# Patient Record
Sex: Male | Born: 1935
Health system: Southern US, Community
[De-identification: ages and names within clinical notes are randomized; demographics above are authoritative.]

## PROBLEM LIST (undated history)

## (undated) DIAGNOSIS — E781 Pure hyperglyceridemia: Secondary | ICD-10-CM

## (undated) DIAGNOSIS — F0390 Unspecified dementia without behavioral disturbance: Secondary | ICD-10-CM

## (undated) DIAGNOSIS — I499 Cardiac arrhythmia, unspecified: Secondary | ICD-10-CM

## (undated) DIAGNOSIS — N189 Chronic kidney disease, unspecified: Secondary | ICD-10-CM

## (undated) DIAGNOSIS — M549 Dorsalgia, unspecified: Secondary | ICD-10-CM

## (undated) DIAGNOSIS — K219 Gastro-esophageal reflux disease without esophagitis: Secondary | ICD-10-CM

## (undated) DIAGNOSIS — K279 Peptic ulcer, site unspecified, unspecified as acute or chronic, without hemorrhage or perforation: Secondary | ICD-10-CM

## (undated) DIAGNOSIS — I219 Acute myocardial infarction, unspecified: Secondary | ICD-10-CM

## (undated) DIAGNOSIS — I9789 Other postprocedural complications and disorders of the circulatory system, not elsewhere classified: Secondary | ICD-10-CM

## (undated) DIAGNOSIS — F0391 Unspecified dementia with behavioral disturbance: Secondary | ICD-10-CM

## (undated) DIAGNOSIS — R29898 Other symptoms and signs involving the musculoskeletal system: Secondary | ICD-10-CM

## (undated) DIAGNOSIS — J449 Chronic obstructive pulmonary disease, unspecified: Secondary | ICD-10-CM

## (undated) DIAGNOSIS — E785 Hyperlipidemia, unspecified: Secondary | ICD-10-CM

## (undated) DIAGNOSIS — F03918 Unspecified dementia, unspecified severity, with other behavioral disturbance: Secondary | ICD-10-CM

## (undated) DIAGNOSIS — I2510 Atherosclerotic heart disease of native coronary artery without angina pectoris: Secondary | ICD-10-CM

## (undated) DIAGNOSIS — M2550 Pain in unspecified joint: Secondary | ICD-10-CM

## (undated) DIAGNOSIS — I4891 Unspecified atrial fibrillation: Secondary | ICD-10-CM

## (undated) DIAGNOSIS — M1990 Unspecified osteoarthritis, unspecified site: Secondary | ICD-10-CM

## (undated) DIAGNOSIS — I10 Essential (primary) hypertension: Secondary | ICD-10-CM

## (undated) DIAGNOSIS — L570 Actinic keratosis: Secondary | ICD-10-CM

## (undated) DIAGNOSIS — K649 Unspecified hemorrhoids: Secondary | ICD-10-CM

## (undated) HISTORY — DX: Peptic ulcer, site unspecified, unspecified as acute or chronic, without hemorrhage or perforation: K27.9

## (undated) HISTORY — DX: Pure hyperglyceridemia: E78.1

## (undated) HISTORY — DX: Dorsalgia, unspecified: M54.9

## (undated) HISTORY — DX: Unspecified dementia, unspecified severity, with behavioral disturbance: F03.91

## (undated) HISTORY — PX: EYE SURGERY: SHX253

## (undated) HISTORY — DX: Unspecified dementia, unspecified severity, with other behavioral disturbance: F03.918

## (undated) HISTORY — DX: Hyperlipidemia, unspecified: E78.5

## (undated) HISTORY — DX: Unspecified atrial fibrillation: I48.91

## (undated) HISTORY — DX: Essential (primary) hypertension: I10

## (undated) HISTORY — DX: Atherosclerotic heart disease of native coronary artery without angina pectoris: I25.10

## (undated) HISTORY — DX: Other postprocedural complications and disorders of the circulatory system, not elsewhere classified: I97.89

## (undated) HISTORY — DX: Other symptoms and signs involving the musculoskeletal system: R29.898

## (undated) HISTORY — DX: Gastro-esophageal reflux disease without esophagitis: K21.9

---
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
## 1986-03-22 HISTORY — PX: CORONARY ARTERY BYPASS GRAFT: SHX141

## 1997-11-20 HISTORY — PX: CORONARY ANGIOPLASTY WITH STENT PLACEMENT: SHX49

## 1997-12-18 ENCOUNTER — Observation Stay (HOSPITAL_COMMUNITY): Admission: AD | Admit: 1997-12-18 | Discharge: 1997-12-19 | Payer: Self-pay | Admitting: Cardiology

## 1997-12-30 ENCOUNTER — Inpatient Hospital Stay (HOSPITAL_COMMUNITY): Admission: EM | Admit: 1997-12-30 | Discharge: 1998-01-01 | Payer: Self-pay | Admitting: Emergency Medicine

## 1998-02-21 ENCOUNTER — Ambulatory Visit (HOSPITAL_COMMUNITY): Admission: RE | Admit: 1998-02-21 | Discharge: 1998-02-21 | Payer: Self-pay | Admitting: *Deleted

## 1998-05-18 ENCOUNTER — Encounter: Payer: Self-pay | Admitting: Emergency Medicine

## 1998-05-19 ENCOUNTER — Inpatient Hospital Stay (HOSPITAL_COMMUNITY): Admission: EM | Admit: 1998-05-19 | Discharge: 1998-05-20 | Payer: Self-pay | Admitting: Emergency Medicine

## 2001-03-22 HISTORY — PX: HEMORRHOID BANDING: SHX5850

## 2001-08-31 ENCOUNTER — Emergency Department (HOSPITAL_COMMUNITY): Admission: EM | Admit: 2001-08-31 | Discharge: 2001-08-31 | Payer: Self-pay | Admitting: *Deleted

## 2001-09-19 ENCOUNTER — Encounter: Payer: Self-pay | Admitting: Urology

## 2001-09-19 ENCOUNTER — Encounter: Admission: RE | Admit: 2001-09-19 | Discharge: 2001-09-19 | Payer: Self-pay | Admitting: Urology

## 2002-08-22 ENCOUNTER — Emergency Department (HOSPITAL_COMMUNITY): Admission: EM | Admit: 2002-08-22 | Discharge: 2002-08-22 | Payer: Self-pay | Admitting: Emergency Medicine

## 2002-09-18 ENCOUNTER — Inpatient Hospital Stay (HOSPITAL_COMMUNITY): Admission: AD | Admit: 2002-09-18 | Discharge: 2002-09-19 | Payer: Self-pay | Admitting: Cardiology

## 2002-09-19 ENCOUNTER — Encounter: Payer: Self-pay | Admitting: Cardiology

## 2004-03-13 ENCOUNTER — Ambulatory Visit: Payer: Self-pay | Admitting: Gastroenterology

## 2004-03-30 ENCOUNTER — Ambulatory Visit: Payer: Self-pay | Admitting: Gastroenterology

## 2004-04-01 ENCOUNTER — Ambulatory Visit: Payer: Self-pay | Admitting: Gastroenterology

## 2004-04-17 ENCOUNTER — Ambulatory Visit: Payer: Self-pay | Admitting: Cardiology

## 2004-04-24 ENCOUNTER — Ambulatory Visit: Payer: Self-pay | Admitting: Cardiovascular Disease

## 2004-05-01 ENCOUNTER — Ambulatory Visit: Payer: Self-pay | Admitting: Gastroenterology

## 2004-07-17 ENCOUNTER — Ambulatory Visit: Payer: Self-pay | Admitting: Cardiology

## 2004-07-24 ENCOUNTER — Ambulatory Visit: Payer: Self-pay | Admitting: Cardiovascular Disease

## 2004-09-17 ENCOUNTER — Ambulatory Visit: Payer: Self-pay | Admitting: Cardiology

## 2004-09-25 ENCOUNTER — Ambulatory Visit: Payer: Self-pay | Admitting: Cardiology

## 2004-11-20 ENCOUNTER — Ambulatory Visit: Payer: Self-pay | Admitting: *Deleted

## 2004-12-04 ENCOUNTER — Ambulatory Visit: Payer: Self-pay | Admitting: Internal Medicine

## 2005-01-15 ENCOUNTER — Ambulatory Visit: Payer: Self-pay | Admitting: Cardiology

## 2005-01-22 ENCOUNTER — Ambulatory Visit: Payer: Self-pay | Admitting: Cardiology

## 2005-02-19 ENCOUNTER — Ambulatory Visit: Payer: Self-pay | Admitting: Cardiology

## 2005-03-05 ENCOUNTER — Ambulatory Visit: Payer: Self-pay | Admitting: Cardiology

## 2005-04-06 ENCOUNTER — Ambulatory Visit: Payer: Self-pay | Admitting: *Deleted

## 2005-04-30 ENCOUNTER — Ambulatory Visit: Payer: Self-pay | Admitting: Cardiology

## 2005-05-14 ENCOUNTER — Ambulatory Visit: Payer: Self-pay | Admitting: Cardiology

## 2005-06-20 HISTORY — PX: CARDIAC CATHETERIZATION: SHX172

## 2005-07-02 ENCOUNTER — Ambulatory Visit: Payer: Self-pay | Admitting: Cardiology

## 2005-07-09 ENCOUNTER — Ambulatory Visit: Payer: Self-pay | Admitting: Internal Medicine

## 2005-08-20 ENCOUNTER — Ambulatory Visit: Payer: Self-pay | Admitting: Cardiology

## 2005-08-27 ENCOUNTER — Ambulatory Visit: Payer: Self-pay | Admitting: Cardiology

## 2005-10-27 ENCOUNTER — Ambulatory Visit: Payer: Self-pay | Admitting: Cardiology

## 2005-11-04 ENCOUNTER — Ambulatory Visit: Payer: Self-pay | Admitting: Cardiology

## 2005-12-23 ENCOUNTER — Ambulatory Visit: Payer: Self-pay | Admitting: Cardiology

## 2005-12-23 ENCOUNTER — Inpatient Hospital Stay (HOSPITAL_COMMUNITY): Admission: EM | Admit: 2005-12-23 | Discharge: 2005-12-24 | Payer: Self-pay | Admitting: Emergency Medicine

## 2005-12-30 ENCOUNTER — Ambulatory Visit: Payer: Self-pay | Admitting: Cardiology

## 2006-01-26 ENCOUNTER — Ambulatory Visit: Payer: Self-pay | Admitting: Cardiology

## 2006-02-21 ENCOUNTER — Ambulatory Visit: Payer: Self-pay | Admitting: Cardiology

## 2006-02-21 LAB — CONVERTED CEMR LAB
ALT: 17 units/L (ref 0–40)
AST: 20 units/L (ref 0–37)
Albumin: 4 g/dL (ref 3.5–5.2)
Cholesterol: 193 mg/dL (ref 0–200)
HDL: 36.5 mg/dL — ABNORMAL LOW (ref >39.0)
Total Bilirubin: 1 mg/dL (ref 0.3–1.2)
Total CK: 56 units/L (ref 7–195)

## 2006-02-24 ENCOUNTER — Ambulatory Visit: Payer: Self-pay | Admitting: Cardiology

## 2006-04-15 ENCOUNTER — Ambulatory Visit: Payer: Self-pay | Admitting: Cardiology

## 2006-04-15 LAB — CONVERTED CEMR LAB
ALT: 19 units/L (ref 0–40)
AST: 21 units/L (ref 0–37)
Albumin: 4.1 g/dL (ref 3.5–5.2)
Alkaline Phosphatase: 43 units/L (ref 39–117)
Bilirubin, Direct: 0.2 mg/dL (ref 0.0–0.3)
Cholesterol: 153 mg/dL (ref 0–200)
Direct LDL: 81.4 mg/dL
HDL: 43.4 mg/dL (ref >39.0)
Total Bilirubin: 0.8 mg/dL (ref 0.3–1.2)
Total CHOL/HDL Ratio: 3.5
Total Protein: 6.6 g/dL (ref 6.0–8.3)
Triglycerides: 214 mg/dL (ref 0–149)
VLDL: 43 mg/dL — ABNORMAL HIGH (ref 0–40)

## 2006-04-21 ENCOUNTER — Ambulatory Visit: Payer: Self-pay | Admitting: Cardiology

## 2006-08-04 ENCOUNTER — Ambulatory Visit: Payer: Self-pay | Admitting: Internal Medicine

## 2006-08-04 LAB — CONVERTED CEMR LAB
Bilirubin, Direct: 0.2 mg/dL (ref 0.0–0.3)
Cholesterol: 181 mg/dL (ref 0–200)
HDL: 40.8 mg/dL (ref >39.0)
LDL Cholesterol: 103 mg/dL — ABNORMAL HIGH (ref 0–99)
Total Bilirubin: 0.7 mg/dL (ref 0.3–1.2)
Total CHOL/HDL Ratio: 4.4
Total Protein: 6.6 g/dL (ref 6.0–8.3)
Triglycerides: 184 mg/dL — ABNORMAL HIGH (ref 0–149)

## 2006-08-18 ENCOUNTER — Ambulatory Visit: Payer: Self-pay | Admitting: Cardiology

## 2006-10-07 ENCOUNTER — Ambulatory Visit: Payer: Self-pay | Admitting: Internal Medicine

## 2006-10-07 LAB — CONVERTED CEMR LAB
AST: 19 units/L (ref 0–37)
Cholesterol: 162 mg/dL (ref 0–200)
Direct LDL: 94.5 mg/dL
HDL: 37.7 mg/dL — ABNORMAL LOW (ref >39.0)
Total Bilirubin: 1 mg/dL (ref 0.3–1.2)
Total Protein: 6.5 g/dL (ref 6.0–8.3)
Triglycerides: 223 mg/dL (ref 0–149)

## 2006-10-13 ENCOUNTER — Ambulatory Visit: Payer: Self-pay | Admitting: Internal Medicine

## 2006-12-09 ENCOUNTER — Ambulatory Visit: Payer: Self-pay | Admitting: Cardiovascular Disease

## 2006-12-09 LAB — CONVERTED CEMR LAB
ALT: 16 units/L (ref 0–53)
AST: 20 units/L (ref 0–37)
Alkaline Phosphatase: 36 units/L — ABNORMAL LOW (ref 39–117)
Bilirubin, Direct: 0.2 mg/dL (ref 0.0–0.3)
Cholesterol: 167 mg/dL (ref 0–200)
Total CHOL/HDL Ratio: 5
Total Protein: 6.7 g/dL (ref 6.0–8.3)

## 2006-12-15 ENCOUNTER — Ambulatory Visit: Payer: Self-pay | Admitting: Cardiology

## 2007-02-07 ENCOUNTER — Ambulatory Visit: Payer: Self-pay | Admitting: Internal Medicine

## 2007-02-10 ENCOUNTER — Ambulatory Visit: Admission: RE | Admit: 2007-02-10 | Discharge: 2007-02-10 | Payer: Self-pay | Admitting: Internal Medicine

## 2007-02-10 ENCOUNTER — Ambulatory Visit: Payer: Self-pay

## 2007-02-10 ENCOUNTER — Ambulatory Visit (HOSPITAL_COMMUNITY): Admission: RE | Admit: 2007-02-10 | Discharge: 2007-02-10 | Payer: Self-pay | Admitting: Internal Medicine

## 2007-03-30 ENCOUNTER — Inpatient Hospital Stay (HOSPITAL_COMMUNITY): Admission: RE | Admit: 2007-03-30 | Discharge: 2007-03-31 | Payer: Self-pay | Admitting: Neurosurgery

## 2007-04-02 ENCOUNTER — Emergency Department (HOSPITAL_COMMUNITY): Admission: EM | Admit: 2007-04-02 | Discharge: 2007-04-02 | Payer: Self-pay | Admitting: Emergency Medicine

## 2007-05-21 HISTORY — PX: PROSTATE SURGERY: SHX751

## 2007-05-23 ENCOUNTER — Ambulatory Visit: Payer: Self-pay | Admitting: Internal Medicine

## 2007-05-23 LAB — CONVERTED CEMR LAB
AST: 21 units/L (ref 0–37)
Albumin: 4 g/dL (ref 3.5–5.2)
Alkaline Phosphatase: 46 units/L (ref 39–117)
Direct LDL: 88.6 mg/dL
HDL: 39.4 mg/dL (ref >39.0)
Total Bilirubin: 1.2 mg/dL (ref 0.3–1.2)
Triglycerides: 259 mg/dL (ref 0–149)
VLDL: 52 mg/dL — ABNORMAL HIGH (ref 0–40)

## 2007-06-01 ENCOUNTER — Ambulatory Visit: Payer: Self-pay | Admitting: Internal Medicine

## 2007-06-01 ENCOUNTER — Ambulatory Visit: Payer: Self-pay | Admitting: Cardiology

## 2007-06-23 ENCOUNTER — Encounter (INDEPENDENT_AMBULATORY_CARE_PROVIDER_SITE_OTHER): Payer: Self-pay | Admitting: Urology

## 2007-06-23 ENCOUNTER — Inpatient Hospital Stay (HOSPITAL_COMMUNITY): Admission: RE | Admit: 2007-06-23 | Discharge: 2007-06-24 | Payer: Self-pay | Admitting: Urology

## 2007-11-24 ENCOUNTER — Ambulatory Visit: Payer: Self-pay | Admitting: Internal Medicine

## 2007-11-24 LAB — CONVERTED CEMR LAB
Albumin: 3.8 g/dL (ref 3.5–5.2)
Cholesterol: 201 mg/dL (ref 0–200)
Direct LDL: 122.1 mg/dL
HDL: 27.2 mg/dL — ABNORMAL LOW (ref >39.0)
Total CHOL/HDL Ratio: 7.4
Total Protein: 6.6 g/dL (ref 6.0–8.3)
VLDL: 39 mg/dL (ref 0–40)

## 2007-11-30 ENCOUNTER — Ambulatory Visit: Payer: Self-pay | Admitting: Cardiology

## 2008-01-08 ENCOUNTER — Ambulatory Visit: Payer: Self-pay | Admitting: Internal Medicine

## 2008-02-05 ENCOUNTER — Ambulatory Visit: Payer: Self-pay

## 2008-02-23 ENCOUNTER — Ambulatory Visit: Payer: Self-pay | Admitting: Internal Medicine

## 2008-02-23 LAB — CONVERTED CEMR LAB
ALT: 15 units/L (ref 0–53)
AST: 20 units/L (ref 0–37)
Albumin: 4.1 g/dL (ref 3.5–5.2)
Alkaline Phosphatase: 41 units/L (ref 39–117)
Cholesterol: 174 mg/dL (ref 0–200)
HDL: 33.1 mg/dL — ABNORMAL LOW (ref >39.0)
Total Protein: 6.9 g/dL (ref 6.0–8.3)
Triglycerides: 190 mg/dL — ABNORMAL HIGH (ref 0–149)

## 2008-03-07 ENCOUNTER — Ambulatory Visit: Payer: Self-pay | Admitting: Cardiology

## 2008-05-30 ENCOUNTER — Ambulatory Visit: Payer: Self-pay | Admitting: Internal Medicine

## 2008-05-30 LAB — CONVERTED CEMR LAB
ALT: 15 units/L (ref 0–53)
AST: 18 units/L (ref 0–37)
Albumin: 3.8 g/dL (ref 3.5–5.2)
Alkaline Phosphatase: 39 units/L (ref 39–117)
Bilirubin, Direct: 0.2 mg/dL (ref 0.0–0.3)
Cholesterol: 254 mg/dL (ref 0–200)
Total Protein: 6.4 g/dL (ref 6.0–8.3)
VLDL: 40 mg/dL (ref 0–40)

## 2008-06-06 ENCOUNTER — Ambulatory Visit: Payer: Self-pay | Admitting: Internal Medicine

## 2008-07-16 IMAGING — CR DG ABDOMEN ACUTE W/ 1V CHEST
4 series · 4 of 4 positions shown · non-contrast
Comparison: none

CLINICAL DATA: Abdominal pain.  
 ACUTE ABDOMINAL SERIES WITH CHEST ? 3 VIEW:

[w chest pa]
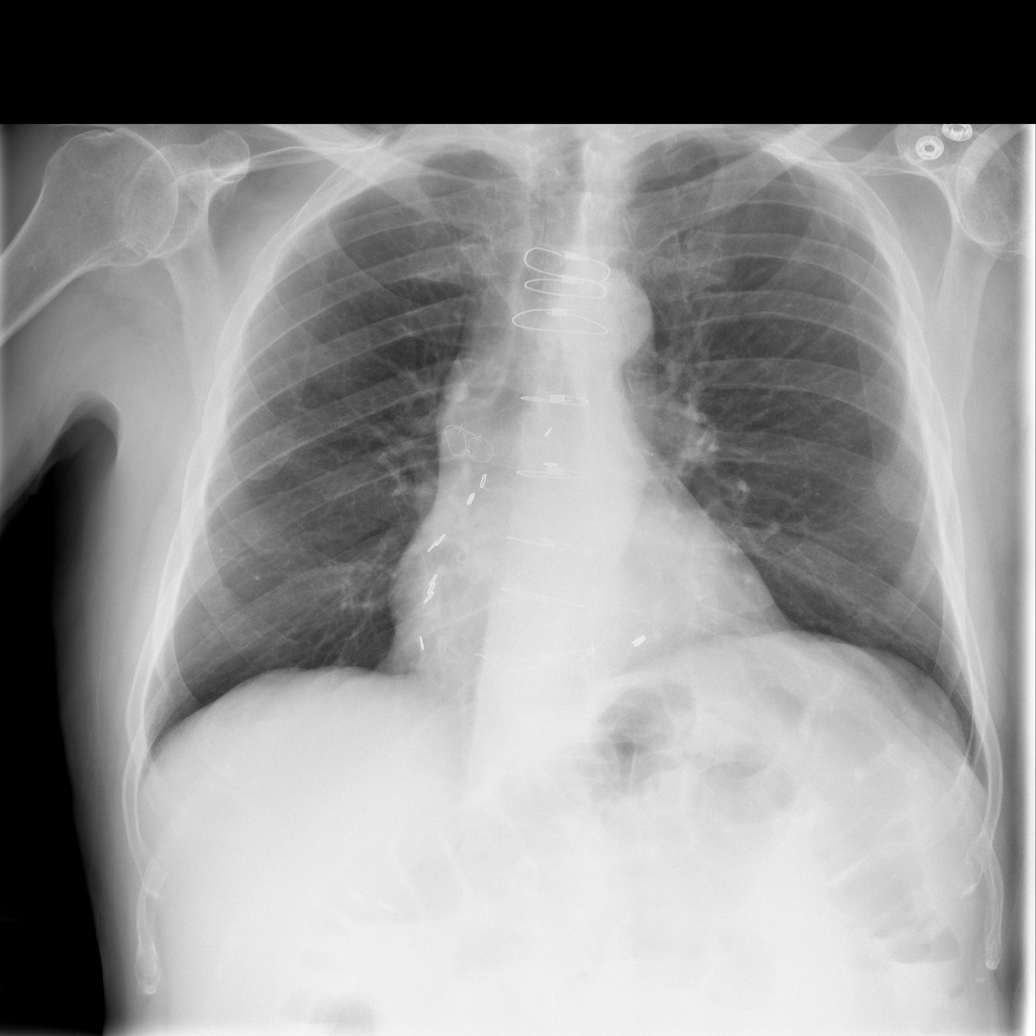

[w abdomen upright]
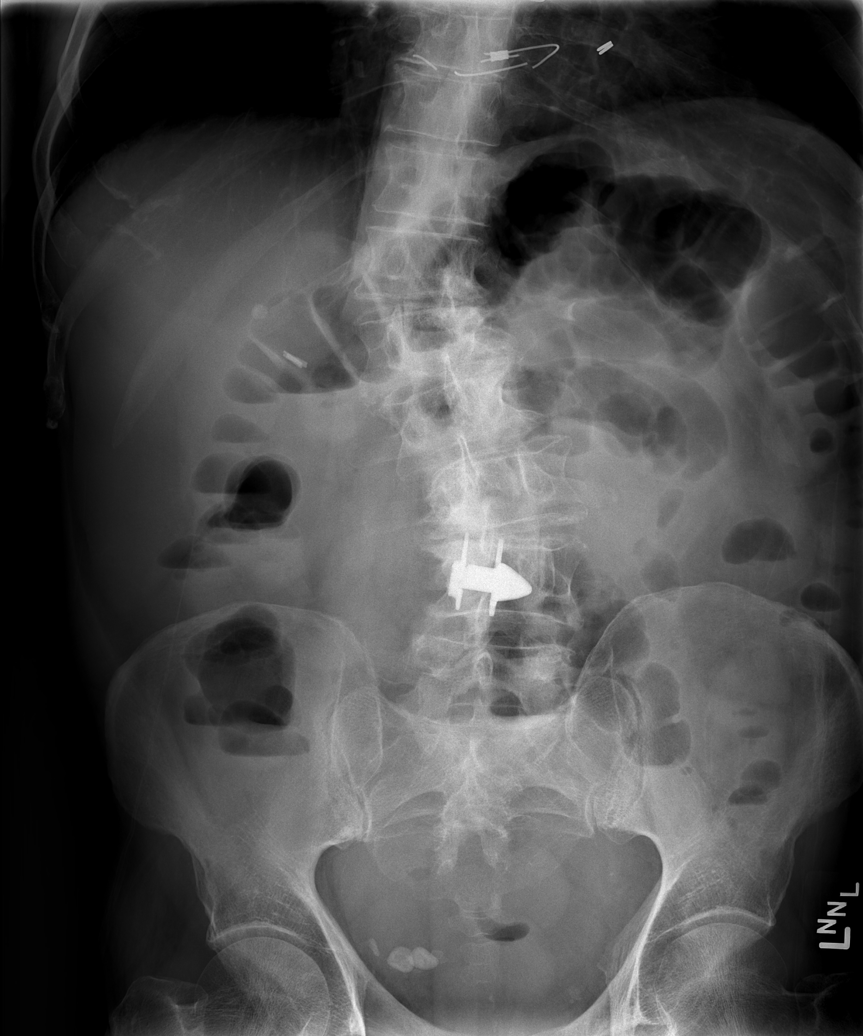

[t abdomen supine (1 of 2)]
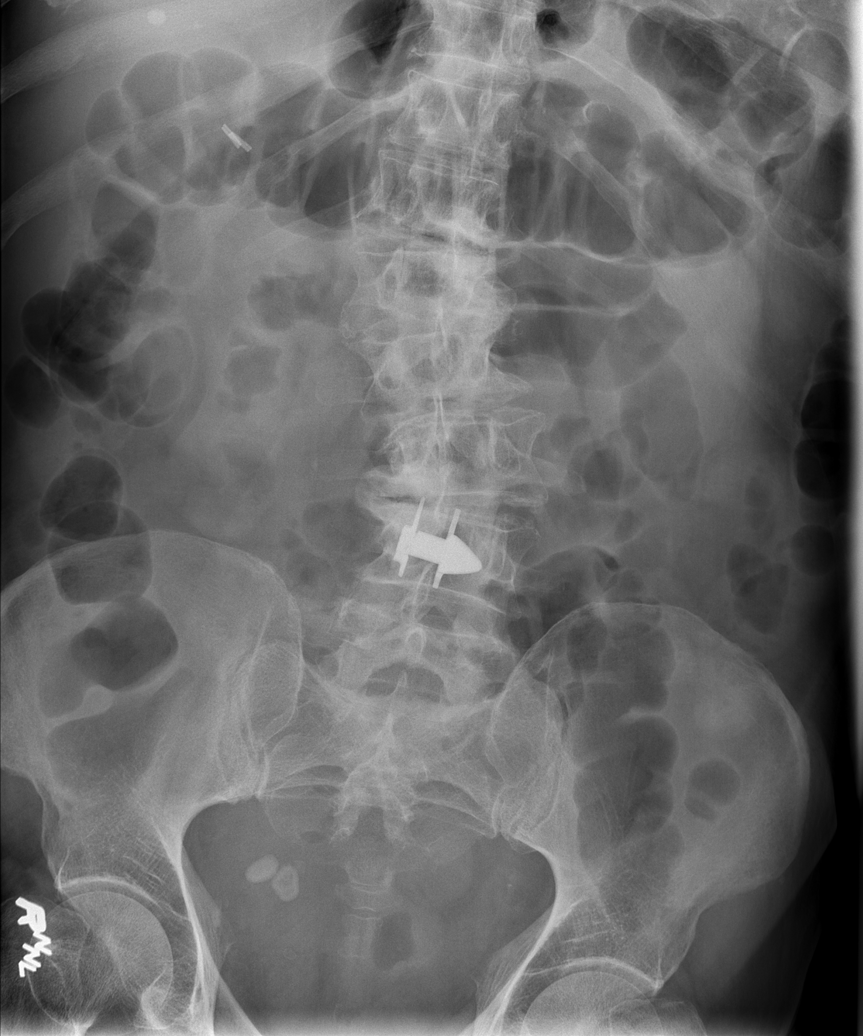

[t abdomen supine (2 of 2)]
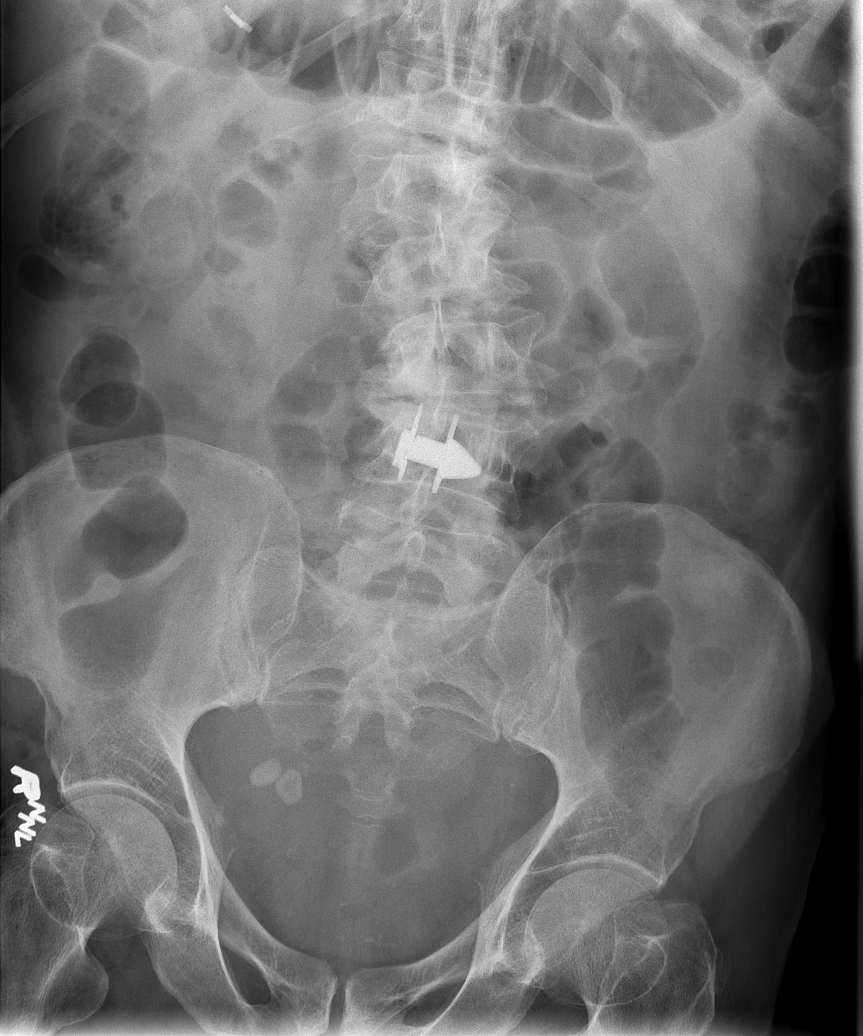

[4 of 4 positions shown; findings below may reference images not displayed]

FINDINGS: The heart is normal in size.  The lungs are clear.
 In the abdomen, mild distention of both small and large bowel is present.  Nonspecific air fluid levels are present in the ascending colon.  There is no free intraperitoneal gas.  Postoperative changes are noted.  There are two 1 cm oval densities in the right side of the pelvis.  These may represent phleboliths.  Differential diagnosis would also include appendicolith.
IMPRESSION: 1. Ileus pattern.
 2. Round calcifications in the right side of the pelvis.  Appendicolith is not excluded.
 3. No active cardiopulmonary disease.

## 2008-08-16 ENCOUNTER — Encounter (INDEPENDENT_AMBULATORY_CARE_PROVIDER_SITE_OTHER): Payer: Self-pay | Admitting: *Deleted

## 2008-09-26 ENCOUNTER — Ambulatory Visit: Payer: Self-pay | Admitting: Internal Medicine

## 2008-09-26 LAB — CONVERTED CEMR LAB
Albumin: 4.1 g/dL (ref 3.5–5.2)
Alkaline Phosphatase: 40 units/L (ref 39–117)
LDL Cholesterol: 83 mg/dL (ref 0–99)
Total Bilirubin: 1.8 mg/dL — ABNORMAL HIGH (ref 0.3–1.2)
Total CHOL/HDL Ratio: 3
Triglycerides: 107 mg/dL (ref 0.0–149.0)

## 2008-10-02 ENCOUNTER — Ambulatory Visit: Payer: Self-pay | Admitting: Internal Medicine

## 2008-11-29 ENCOUNTER — Ambulatory Visit: Payer: Self-pay | Admitting: Internal Medicine

## 2008-11-29 DIAGNOSIS — I10 Essential (primary) hypertension: Secondary | ICD-10-CM

## 2008-11-29 DIAGNOSIS — I2510 Atherosclerotic heart disease of native coronary artery without angina pectoris: Secondary | ICD-10-CM | POA: Insufficient documentation

## 2008-11-29 DIAGNOSIS — E785 Hyperlipidemia, unspecified: Secondary | ICD-10-CM

## 2008-11-29 DIAGNOSIS — R0989 Other specified symptoms and signs involving the circulatory and respiratory systems: Secondary | ICD-10-CM | POA: Insufficient documentation

## 2009-01-17 ENCOUNTER — Ambulatory Visit: Payer: Self-pay | Admitting: Internal Medicine

## 2009-01-27 ENCOUNTER — Ambulatory Visit: Payer: Self-pay | Admitting: Internal Medicine

## 2009-01-27 LAB — CONVERTED CEMR LAB
Cholesterol, target level: 200 mg/dL
HDL goal, serum: 40 mg/dL
LDL Goal: 70 mg/dL

## 2009-02-03 LAB — CONVERTED CEMR LAB
Albumin: 4.1 g/dL (ref 3.5–5.2)
Bilirubin, Direct: 0.2 mg/dL (ref 0.0–0.3)
HDL: 36.5 mg/dL — ABNORMAL LOW (ref >39.00)
Total CHOL/HDL Ratio: 5
Total Protein: 7 g/dL (ref 6.0–8.3)
VLDL: 51.2 mg/dL — ABNORMAL HIGH (ref 0.0–40.0)

## 2009-04-09 HISTORY — PX: CERVICAL SPINE SURGERY: SHX589

## 2009-05-02 ENCOUNTER — Ambulatory Visit: Payer: Self-pay | Admitting: Internal Medicine

## 2009-05-06 LAB — CONVERTED CEMR LAB
ALT: 16 units/L (ref 0–53)
AST: 18 units/L (ref 0–37)
Bilirubin, Direct: 0.3 mg/dL (ref 0.0–0.3)
Total Bilirubin: 1.2 mg/dL (ref 0.3–1.2)
Triglycerides: 330 mg/dL — ABNORMAL HIGH (ref 0.0–149.0)

## 2009-05-08 ENCOUNTER — Ambulatory Visit: Payer: Self-pay | Admitting: Cardiology

## 2009-08-20 ENCOUNTER — Ambulatory Visit: Payer: Self-pay | Admitting: Internal Medicine

## 2009-08-20 LAB — CONVERTED CEMR LAB
Alkaline Phosphatase: 47 units/L (ref 39–117)
Bilirubin, Direct: 0.2 mg/dL (ref 0.0–0.3)
Total CHOL/HDL Ratio: 8
VLDL: 90.8 mg/dL — ABNORMAL HIGH (ref 0.0–40.0)

## 2009-08-28 ENCOUNTER — Ambulatory Visit: Payer: Self-pay | Admitting: Internal Medicine

## 2009-12-22 ENCOUNTER — Ambulatory Visit: Payer: Self-pay | Admitting: Internal Medicine

## 2009-12-23 LAB — CONVERTED CEMR LAB
AST: 28 units/L (ref 0–37)
Albumin: 3.9 g/dL (ref 3.5–5.2)
Cholesterol: 152 mg/dL (ref 0–200)
Direct LDL: 78.8 mg/dL
HDL: 46.4 mg/dL (ref >39.00)
Total CHOL/HDL Ratio: 3
Triglycerides: 207 mg/dL — ABNORMAL HIGH (ref 0.0–149.0)
VLDL: 41.4 mg/dL — ABNORMAL HIGH (ref 0.0–40.0)

## 2009-12-29 ENCOUNTER — Ambulatory Visit: Payer: Self-pay | Admitting: Cardiology

## 2009-12-29 ENCOUNTER — Ambulatory Visit: Payer: Self-pay | Admitting: Internal Medicine

## 2010-01-23 ENCOUNTER — Ambulatory Visit: Payer: Self-pay | Admitting: Internal Medicine

## 2010-01-23 ENCOUNTER — Ambulatory Visit: Payer: Self-pay

## 2010-01-23 DIAGNOSIS — I6529 Occlusion and stenosis of unspecified carotid artery: Secondary | ICD-10-CM

## 2010-02-20 ENCOUNTER — Telehealth: Payer: Self-pay | Admitting: Internal Medicine

## 2010-04-21 NOTE — Assessment & Plan Note (Signed)
Summary: rov/cb  Medications Added RANITIDINE HCL 150 MG TABS (RANITIDINE HCL) two times a day      Allergies Added:   [name] returns to Lipid Clinic for a 3 mo. f/u for hyperlipidemia and PAD/CAD. Currently taking fish oil, tricor, niacin, and vytorin. Tolerating current regimen well with no complaints. Pt reports recently the expense of Tricor and Vytorin has increased significantly due to changes in insurance formulary.   He eats a low-fat, low cholesterol diet and avoids fried foods. He notes he has been laying off oatmeal cookies. He eats lean meats most days of the week, red meat about 1x/week, and fish 1-2x/week. He does not smoke and endorses rarely drinking alcohol.   [name] reports exercising on a stationary bike 15-30 minutes 2-3x/week. He had back surgery last January and is still recovering (expected recovery 12-18 months). Pain in his back and legs are the biggest barriers to increasing his exercise.   Lipid Management Provider  Reina Fuse, PharmD  Preventive Screening-Counseling & Management  Alcohol-Tobacco     Alcohol drinks/day: <1     Alcohol type: wine     Smoking Status: quit > 6 months  Caffeine-Diet-Exercise     Caffeine use/day: coffee 2-6 cups/day     Does Patient Exercise: yes     Type of exercise: stationary bike     Exercise (avg: min/session): 15     Times/week: 3  Current Medications (verified): 1)  Ra Fish Oil 1000 Mg Caps (Omega-3 Fatty Acids) .... 8 Caps Daily. 2)  Niacin 500 Mg Tabs (Niacin) .... 4 Tabs Daily 3)  Zantac 150 Mg Tabs (Ranitidine Hcl) .Marland Kitchen.. 1 By Mouth Bid 4)  Tricor 145 Mg Tabs (Fenofibrate) .Marland Kitchen.. 1 By Mouth Daily. 5)  Ramipril 10 Mg Caps (Ramipril) .... Take One Capsule By Mouth Daily 6)  Aspirin 81 Mg Tbec (Aspirin) .... Take One Tablet By Mouth Daily 7)  Metoprolol Tartrate 50 Mg Tabs (Metoprolol Tartrate) .... Take One Tablet By Mouth Twice A Day 8)  Vytorin 10-40 Mg Tabs (Ezetimibe-Simvastatin) .... Take One Tablet By  Mouth Daily At Bedtime 9)  Metamucil 48.57 % Powd (Psyllium) .Marland Kitchen.. 1 Scoop By Mouth Two Times A Day 10)  Ranitidine Hcl 150 Mg Tabs (Ranitidine Hcl) .... Two Times A Day  Allergies (verified): 1)  ! Penicillin  Social History: Alcohol drinks/day:  <1 Caffeine use/day:  coffee 2-6 cups/day Does Patient Exercise:  yes   Vital Signs:  Patient profile:   77 year old male Height:      70 inches Weight:      171 pounds BMI:     24.62 BP sitting:   124 / 70  (left arm)  Impression & Recommendations:  Problem # 1:  HYPERLIPIDEMIA-MIXED (ICD-272.4) Assessment Improved Lipid panel much improved and very close to NCEP/ATP III goals: TC<200, LDL<70, HDL>40, TG<150. Current lipid panel results: TC 152, TG 207, HDL 46, LDL 78.8. Continue current medication regimen with Vytorin, Tricor, Niacin, Fish Oil and metamucil. Continue positive dietary changes including eating lean meats and fish and avoiding fried foods. Discussed importance of compliance with medications despite increased cost. Provided samples and discount card and encouraged pt to call if he was unable to afford current regimen. Encouraged increasing exericse to stationary bike for 15-30 minutes most days of the week as able to tolerate. Return to clinic for labs and f/u in 6 months.  His updated medication list for this problem includes:    Niacin 500 Mg Tabs (Niacin) .Marland KitchenMarland KitchenMarland KitchenMarland Kitchen  4 tabs daily    Tricor 145 Mg Tabs (Fenofibrate) .Marland Kitchen... 1 by mouth daily.    Vytorin 10-40 Mg Tabs (Ezetimibe-simvastatin) .Marland Kitchen... Take one tablet by mouth daily at bedtime  Patient Instructions: 1)  1.) Continue taking Vytorin, Tricor, Fish Oil, and Niacin. 2)  2.) Keep up the good work watching your diet by avoiding fried and fatty foods and eating lean meats ([place], chicken) and fish. 3)  3.) Exercise on your bicycle most days of the week for 15-30 minutes.  4)  4.) Come back to Lipid Clinic in 6 months.  5)    6)  [facility]id Clinic 06/04/10 at 3:00 PM 7)  Labs at  [facility] 06/01/10 at 7:30 AM

## 2010-04-21 NOTE — Assessment & Plan Note (Signed)
Summary: f1y    Visit Type:  1 year follow up Referring Provider:  Bensimhon Primary Provider:  perry  CC:  No complaints.  History of Present Illness:  [name] is a very pleasant 77 year old male with history of coronary artery disease.  He is status post bypass grafting in 1988 and 1999.  He had an inferior infarct and underwent stenting of the vein graft to the right coronary artery.  Ejection fraction was 62%.  He had an episode of presyncope and underwent cardiac catheterization in 2007..  This showed patent grafts with an EF of 50%.  He also has a history of hypertension, hyperlipidemia, and a spinal stenosis s/p surgery on his upper and lower back  Returns for routine f/u. Denies CP or SOB. Still having tingling and weakness in his legs and walking with a walker. Says feet often get cold. No clear claudication but hard to walk far. No sores. No CHF. Lipids followed in lipid clinic.   Current Medications (verified): 1)  Ra Fish Oil 1000 Mg Caps (Omega-3 Fatty Acids) .... 8 Caps Daily. 2)  Niacin 500 Mg Tabs (Niacin) .... 4 Tabs Daily 3)  Zantac 150 Mg Tabs (Ranitidine Hcl) .Marland Kitchen.. 1 By Mouth Bid 4)  Tricor 145 Mg Tabs (Fenofibrate) .Marland Kitchen.. 1 By Mouth Daily. 5)  Ramipril 10 Mg Caps (Ramipril) .... Take One Capsule By Mouth Daily 6)  Aspirin 81 Mg Tbec (Aspirin) .... Take One Tablet By Mouth Daily 7)  Metoprolol Tartrate 50 Mg Tabs (Metoprolol Tartrate) .... Take One Tablet By Mouth Twice A Day 8)  Vytorin 10-40 Mg Tabs (Ezetimibe-Simvastatin) .... Take One Tablet By Mouth Daily At Bedtime 9)  Metamucil 48.57 % Powd (Psyllium) .Marland Kitchen.. 1 Scoop By Mouth Two Times A Day  Allergies: 1)  ! Penicillin  Past History:  Past Medical History: Last updated: 11/29/2008  1. Coronary artery disease.      a.     Status post inferior myocardial infarction in 1988.      b.     Status post coronary artery bypass grafting x3in 1988 with left       internal mammary artery to left anterior descending,  vein graft to the       obtuse marginal, vein graft to the right coronary artery.      c.     September 1999, status post percutaneous coronary intervention       and stenting with bare metal stent of the vein graft to the right       coronary artery.      d.    Cath 4/07 EF 50%. severe native CAD. SVG-RCA 50-60 othewise all grafts patent  2. Hypertension.  3. Hyperlipidemia.  4. Hypertriglyceridemia.  5. Gastroesophageal reflux disease/peptic ulcer disease.  6. History of postoperative atrial fibrillation   7. Severe back pain   Review of Systems       As per HPI and past medical history; otherwise all systems negative.   Vital Signs:  Patient profile:   77 year old male Height:      70 inches Weight:      171 pounds BMI:     24.62 Pulse rate:   65 / minute Pulse rhythm:   irregular Resp:     18 per minute BP sitting:   120 / 70  (left arm) Cuff size:   large  Vitals Entered By: Vikki Ports (December 29, 2009 2:48 PM)  Physical Exam  General:  Elderly. walks with walker.  No acute distress. HEENT: normal except for extensive sun damage. NECK:  Supple.  Carotids are 1+ bilaterally.  There is no lymphadenopathy or thyromegaly.   CARDIAC:  He is barrel-chested.  PMI is nonpalpable.  He is mildly irregular with distant heart sounds.  There is a soft systolic ejection murmur at the left sternal border. LUNGS:  Have diminished breath sounds throughout.  No wheezing or rales. ABDOMEN:  Soft, nontender, and nondistended. No bruits or no masses. EXTREMITIES:  Warm with no cyanosis, clubbing, or edema. DPS 2+ bilaterally NEUROLOGIC:  Alert and oriented x3.  Cranial nerves II through XII were intact.  Moves all fours without difficulty.  Affect is pleasant.    Impression & Recommendations:  Problem # 1:  CAD, NATIVE VESSEL (ICD-414.01) Stable. No evidence of ischemia. Continue current regimen.  Problem # 2:  HYPERTENSION, BENIGN (ICD-401.1) Blood pressure well controlled.  Continue current regimen.  Problem # 3:  Leg weakness Suspect neuropathic. Will recheck ABIs.   Other Orders: Arterial Duplex Lower Extremity (Arterial Duplex Low)  Patient Instructions: 1)  Your physician recommends that you schedule a follow-up appointment in: 12 months 2)  Your physician has requested that you have an ankle brachial index (ABI). During this test an ultrasound and blood pressure cuff are used to evaluate the arteries that supply the arms and legs with blood. Allow thirty minutes for this exam. There are no restrictions or special instructions.
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
## 2010-04-21 NOTE — Progress Notes (Signed)
Summary: rx refill  Phone Note From Pharmacy   Caller: [facility] Reason for Call: Needs renewal Summary of Call:  METOPROLOL TARTRATE 50 MG TABS. please call this in their fax is not working. Initial call taken by: Roe Coombs,  February 20, 2010 12:45 PM  Follow-up for Phone Call        Spoke with pharmacist and it is filled and ready, they did recieve electronically Follow-up by: Hardin Negus, RMA,  February 20, 2010 4:51 PM

## 2010-04-21 NOTE — Progress Notes (Signed)
Summary: rx refill  Phone Note Refill Request Message from:  Patient on February 20, 2010 8:44 AM  Refills Requested: Medication #1:  METOPROLOL TARTRATE 50 MG TABS Take one tablet by mouth twice a day pt would like if we can refax to the pharmacy. pharmacy fax number on file.   Method Requested: Fax to Local Pharmacy Initial call taken by: Roe Coombs,  February 20, 2010 8:43 AM    Prescriptions: METOPROLOL TARTRATE 50 MG TABS (METOPROLOL TARTRATE) Take one tablet by mouth twice a day  #60 x 7   Entered by:   Hardin Negus, RMA   Authorized by:   Dolores Patty, MD, [facility]   Signed by:   Hardin Negus, RMA on 02/20/2010   Method used:   Electronically to        [facility] (retail)       [address]       [place], [place]  [id number]       Ph: [phone]       Fax: [phone]   RxID:   [id number]

## 2010-04-21 NOTE — Assessment & Plan Note (Signed)
Summary: rov lipid - [facility]   Visit Type:  Follow-up Referring Provider:  Bensimhon Primary Provider:  Marina Goodell   History of Present Illness: [name] is a pleasant 77 yo gentlemen presenting to Lipid Clinic today for FU visit.  Pt has resistant hyperlipidemia evidenced by current therapy of Vytorin, Tricor, Fish Oil, and Niacin and deteriorating lipid levels.  He is tolerating this therapy well and is adherent to regimen.  Pt has baseline myalgias, which worsened with Crestor and Lipitor.  Patient eats a mainly healthy diet.  For breakfast, he has plain grits,  Raisin Bran with skim milk, or egg beaters.  For lunch and supper, he eats mostly chicken and [place].  He only eats red meat, which is lean, every couple weeks.  He does eat fruits/vegetables, but not 5 servings daily.  He drinks diet coke, unsweet tea, and black coffee.  Food is cooked with minimal olive oil and no butter.  He has not been snacking.  His exercise is limited by myalgias.  He had back surgery 5-6 months ago which has increased his pain and weakness.  MD states that it may take 12-18 months to recover.   [name] is seen back in lipid clinic for evaluation and medication titration associated with his hyperlipidemia and PAD/CAD.  He continues to have leg pain that prevents him from walking as much as he otherwise would.  He has been following a low-fat, low-cholesterol diet, and has been compliant with current medication therapy of fish oil, tricor, niacin, and simvastatin.    Lipid Management Provider  Weston Brass, PharmD  Preventive Screening-Counseling & Management  Alcohol-Tobacco     Alcohol drinks/day: 0     Smoking Status: quit > 6 months     Smoke Cessation Stage: quit     Year Quit: 1963  Current Medications (verified): 1)  Ra Fish Oil 1000 Mg Caps (Omega-3 Fatty Acids) .... 8 Caps Daily. 2)  Niacin 500 Mg Tabs (Niacin) .... 4 Tabs Daily 3)  Zantac 150 Mg Tabs (Ranitidine Hcl) .Marland Kitchen.. 1 By Mouth Bid 4)  Tricor  145 Mg Tabs (Fenofibrate) .Marland Kitchen.. 1 By Mouth Daily. 5)  Ramipril 10 Mg Caps (Ramipril) .... Take One Capsule By Mouth Daily 6)  Aspirin 81 Mg Tbec (Aspirin) .... Take One Tablet By Mouth Daily 7)  Metoprolol Tartrate 50 Mg Tabs (Metoprolol Tartrate) .... Take One Tablet By Mouth Twice A Day 8)  Vytorin 10-40 Mg Tabs (Ezetimibe-Simvastatin) .... Take One Tablet By Mouth Daily At Bedtime 9)  Metamucil 48.57 % Powd (Psyllium) .Marland Kitchen.. 1 Scoop By Mouth Two Times A Day  Allergies: 1)  ! Penicillin  Social History: Smoking Status:  quit > 6 months   Vital Signs:  Patient profile:   77 year old male Weight:      175 pounds BMI:     25.20  Impression & Recommendations:  Problem # 1:  HYPERLIPIDEMIA-MIXED (ICD-272.4) Assessment Deteriorated NCEP/ATP III goals for patient are:  TC<200, LDL<70, HDL>40, TG<150.  Current lipid panel results are:  TC 295, LDL 172.8, HDL 36.3, and TG 454, which have worsened since 2/11. LFTs WNL. Bile acid sequestrants are not an option at this time due to TG>300. No pharmacological options for additional benefit available, except possibly pioglitazone, however pt's A1c=5.4% 08/20/09.  Continue current tx of Vytorin, Tricor, Niacin, and high-dose Fish Oil. Take Vytorin and Tricor in PM. Continue taking Metamucil 1 scoop two times a day as he has been doing for last week. Continue healthy  diet and increase fruits/vegetables. Exercise as tolerated. FU lipid/liver tests (12/22/09) and Lipid Clinic visit 12/29/09. His updated medication list for this problem includes:    Niacin 500 Mg Tabs (Niacin) .Marland KitchenMarland KitchenMarland KitchenMarland Kitchen 4 tabs daily    Tricor 145 Mg Tabs (Fenofibrate) .Marland Kitchen... 1 by mouth daily.    Vytorin 10-40 Mg Tabs (Ezetimibe-simvastatin) .Marland Kitchen... Take one tablet by mouth daily at bedtime    Fish Oil 1000 mg Caps .Marland Kitchen... 8 capsules by mouth daily  Patient Instructions: 1)  Continue taking Vytorin, Tricor, Niacin, and Fish Oil as prescribed. 2)  Start taking Tricor and Vytorin at night. 3)   Continue taking Metamucil 1 scoop twice daily as directed. 4)  Continue making low-fat dietary choices. 5)  Exercise as you are able to do so. 6)  FU with Lipid Clinic on 12/29/09 at 4:00 and fasting labs the week of 12/22/09 prior to visit.

## 2010-04-21 NOTE — Miscellaneous (Signed)
Summary: Orders Update  Clinical Lists Changes  Problems: Added new problem of CAROTID ARTERY DISEASE (ICD-433.10) Orders: Added new Test order of Carotid Duplex (Carotid Duplex) - Signed 

## 2010-05-29 ENCOUNTER — Encounter (INDEPENDENT_AMBULATORY_CARE_PROVIDER_SITE_OTHER): Payer: Self-pay | Admitting: *Deleted

## 2010-05-29 ENCOUNTER — Other Ambulatory Visit: Payer: Self-pay | Admitting: Internal Medicine

## 2010-05-29 ENCOUNTER — Other Ambulatory Visit: Payer: Medicare Other

## 2010-05-29 DIAGNOSIS — E785 Hyperlipidemia, unspecified: Secondary | ICD-10-CM

## 2010-05-29 DIAGNOSIS — Z79899 Other long term (current) drug therapy: Secondary | ICD-10-CM

## 2010-05-29 LAB — HEPATIC FUNCTION PANEL
ALT: 19 U/L (ref 0–53)
Bilirubin, Direct: 0.2 mg/dL (ref 0.0–0.3)
Total Protein: 6.4 g/dL (ref 6.0–8.3)

## 2010-05-29 LAB — LIPID PANEL
HDL: 33 mg/dL — ABNORMAL LOW (ref >39.00)
Triglycerides: 319 mg/dL — ABNORMAL HIGH (ref 0.0–149.0)

## 2010-06-04 ENCOUNTER — Ambulatory Visit: Payer: Self-pay

## 2010-06-11 ENCOUNTER — Ambulatory Visit: Payer: Self-pay

## 2010-06-18 ENCOUNTER — Ambulatory Visit (INDEPENDENT_AMBULATORY_CARE_PROVIDER_SITE_OTHER): Payer: Medicare Other

## 2010-06-18 VITALS — BP 122/80 | Wt 176.5 lb

## 2010-06-18 DIAGNOSIS — E785 Hyperlipidemia, unspecified: Secondary | ICD-10-CM

## 2010-06-18 NOTE — Patient Instructions (Signed)
Continue same medications.   Try to continue to cut down on your oatmeal cookies and starchy vegetables.  We want to try to eat as many high fiber vegetables as possible such as green beans, broccoli, carrots, etc.   Try to ride your stationary bike 5-10 minutes a day then slowly work you way up to longer periods of time.   Recheck labs in 4 months.

## 2010-06-21 MED ORDER — FENOFIBRATE 160 MG PO TABS: 160.0000 mg | ORAL_TABLET | Freq: Every day | ORAL | Status: DC

## 2010-06-21 NOTE — Progress Notes (Signed)
[name] is a pleasant 77 yo gentlemen presenting to Lipid Clinic today for FU visit. He is accompanied by his wife.  Pt's current therapy includes Vytorin, Tricor, Fish Oil, and Niacin. He is tolerating this therapy well with no complaints of muscle pain, aches, chest pain, SOB.  His only complaint is an increase in arthritic pain.  He is now having to use a walker to help him walk.   He usually takes OTC NSAIDs with some relief.  He has an appt with his PCP soon.   Patient eats a mainly healthy diet. For breakfast, he has [place] bacon, whole wheat bread, and egg beaters. For lunch and supper, he eats mostly chicken and [place].  He does eat fruits/vegetables, but not 5 servings daily.  He likes green beans, pinto beans, potatoes, and stir fry. He drinks diet coke, unsweet tea, and black coffee.  His exercise is limited by a back surgery he had last year.  Despite this and the arthritis pain, he does try to ride a stationary bike for approximately 30 minutes a few days a week.  He does state this helps his arthritis pain as well but if he rides for a long time (~30 minutes) he is very tired for the next few days.   Current Outpatient Prescriptions  Medication Sig Dispense Refill  . aspirin 81 MG tablet Take 81 mg by mouth daily.        . fenofibrate 160 MG tablet Take 160 mg by mouth daily.        . fish oil-omega-3 fatty acids 1000 MG capsule Take 8 g by mouth daily.        . metoprolol (LOPRESSOR) 50 MG tablet Take 50 mg by mouth 2 (two) times daily.       . niacin 500 MG tablet Take 2,000 mg by mouth daily.        . psyllium (METAMUCIL) 58.6 % powder Take 1 packet by mouth daily as needed.        . ramipril (ALTACE) 10 MG capsule Take 10 mg by mouth daily.       . ranitidine (ZANTAC) 150 MG tablet Take 150 mg by mouth 2 (two) times daily.        Marland Kitchen VYTORIN 10-40 MG per tablet Take 1 tablet by mouth at bedtime.         Allergies  Allergen Reactions  . Penicillins     REACTION: hives

## 2010-06-21 NOTE — Assessment & Plan Note (Addendum)
Pt's cholesterol somewhat controlled on current therapy.  LDL at goal of <70 but TG remain elevated at 319. LFTs are WNL/ Pt is on maximum doses of current therapies so encouraged lifestyle changes rather than pharmacological changes.  Discussed importance of eating more high-fiber vegetables rather than starchy vegetables.  Also asked patient to try to exercise 10-15 minutes every day rather than 30 minutes every few days.  Will f/u in 4 months.

## 2010-08-04 NOTE — Assessment & Plan Note (Signed)
[facility]                               LIPID CLINIC NOTE   SUMMIT, ARROYAVE                        MRN:          [id number]  DATE:12/15/2006                            DOB:          1928/06/27    Mr. Cartlidge comes in today for follow-up of his hyperlipidemia therapy,  which currently includes Vytorin 10/40 mg, Tricor 145 mg, over-the-  counter niacin 2 g daily, and fish oil 8 g daily.  He has been compliant  with these therapies and is tolerating them just fine.  He does complain  of muscle cramps in his bilateral lower extremities at night, but this  is not new for him.  His other medications include Altace, Lopressor,  aspirin and Zantac and p.r.n. Advil.   PHYSICAL EXAM:  Weight of 176 pounds.  Blood pressure 130/70, heart rate  is 68.   LABORATORY DATA:  Total cholesterol 167, triglycerides 203, HDL 33.1,  LDL 99.1.  Liver function tests are within normal limits.   ASSESSMENT:  Mr. Eckstein LDL remains above goal of less than 70, his HDL  is not at goal of greater than 40, and his triglycerides are up slightly  and no longer at goal of less than 150.  He reports continuing to avoid  red meat, bacon, sausage, has egg substitute when he has eggs in the  morning.  He just in general has a somewhat poor appetite.  He is unable  to exercise due to osteoarthritic pain.  He is on maximum therapy for  cholesterol-lowering except for the Vytorin, which could be increased to  10/80 mg; however, the patient is resistant to increase or change in any  of his medicines.  I reinforced to him the importance of attaining  cholesterol goals.   PLAN:  We are going to continue with the same medications for now, and I  encouraged him to continue with his diet, making any improvements that  he can, and I asked him to exercise if at all able to.  We are going to  follow up in 6 months with repeat lipid and liver panel and if at that  time his cholesterol numbers  are not at goal, we will increase Vytorin  to 10/80 mg.  He was instructed to call us with any concerns or problems  in the interim.      Charolotte Eke, PharmD  Electronically Signed      Rollene Rotunda, MD, [facility]  Electronically Signed   TP/MedQ  DD: 12/15/2006  DT: 12/16/2006  Job #: [id number]

## 2010-08-04 NOTE — Assessment & Plan Note (Signed)
[facility] HEALTHCARE                            CARDIOLOGY OFFICE NOTE   JUSTUS, DROKE                        MRN:          [id number]  DATE:01/08/2008                            DOB:          06/15/1928    PRIMARY CARE PHYSICIAN:  Marguerite Olea, MD.   INTERVAL HISTORY:  [name] is a very pleasant 77 year old male with  history of coronary artery disease.  He is status post bypass grafting  in 1998 and 1999.  He had an inferior infarct and underwent stenting of  the vein graft to the right coronary artery.  Ejection fraction was 62%  in 2007.  He had an episode of presyncope and underwent cardiac  catheterization.  This showed patent grafts with an EF of 50%.  He also  has a history of hypertension, hyperlipidemia, and a spinal stenosis.   He underwent an X-STOP spacer device for spinal stenosis by Dr. Kerri Perches in January.  This was complicated by postop urinary retention.  He  had to wear a Foley catheter for 3 months, fortunately that resolved.  He notes that he continues to have pain in his back and down his legs.  He feels it is some better, but definitely is still quite severe.  He  was discharged from Dr. Fredrich Birks care and has thus not followed up.   From a cardiac point-of-view, he denies any chest pain.  He does have  chronic dyspnea, which is really with moderate activity, which is really  unchanged.  He is much more limited by his back and leg pain.  He had a  PFTs in November 2008, which showed mild COPD.   He denies any orthopnea or PND.  No lower extremity edema.  He has been  followed in the Lipid Clinic.   CURRENT MEDICATIONS:  1. Altace 10 mg a day.  2. Lopressor 50 b.i.d.  3. Aspirin 81 a day.  4. TriCor 145 a day.  5. Fish oil 8-9 g a day.  6. Zantac 150 b.i.d.  7. Niacin 2 g a day.  8. Vytorin 10/80.   PHYSICAL EXAMINATION:  GENERAL:  He is an elderly male, in no acute  distress, ambulates around the clinic without any  respiratory  difficulty.  VITAL SIGNS:  Blood pressure is 100/63, heart rate 63, and weight is  169.  HEENT:  Normal.  NECK:  Supple.  Carotids are 1+ bilaterally.  Question of right carotid  bruit.  There is no lymphadenopathy or thyromegaly.  CARDIAC:  He is  barrel-chested.  PMI is nonpalpable.  He is regular with distant heart  sounds.  There is a soft systolic ejection murmur at the left sternal  border.  LUNGS:  Have diminished breath sounds throughout.  No wheezing or rales.  ABDOMEN:  Soft, nontender, and nondistended.  No obvious  hepatosplenomegaly.  No bruits or no masses.  EXTREMITIES:  Warm with no cyanosis, clubbing, or edema.  NEUROLOGIC:  Alert and oriented x3.  Cranial nerves II through XII were  intact.  Moves all fours without difficulty.  Affect  is pleasant.   EKG shows very small inferior Q-waves and no acute ST-T wave  abnormalities.   ASSESSMENT AND PLAN:  1. Coronary artery disease is stable.  No evidence of ischemia.      Continue current therapy.  2. Possible right carotid bruit.  We will get an ultrasound to further      evaluate.  3. Spinal stenosis with continued pain.  We will refer him to      [facility] for a second opinion.  4. Hyperlipidemia is followed by a Lipid Clinic, obviously goal LDL is      less than 70.   DISPOSITION:  We will see him back in clinic in approximately 9-12  months for routine followup.     Bevelyn Buckles. Bensimhon, MD  Electronically Signed    DRB/MedQ  DD: 01/08/2008  DT: 01/09/2008  Job #: [id number]

## 2010-08-04 NOTE — Assessment & Plan Note (Signed)
[facility]                            CARDIOLOGY OFFICE NOTE   JACOBI, NILE                        MRN:          [id number]  DATE:06/01/2007                            DOB:          10-Sep-1928    REFERRING PHYSICIAN:  Lindaann Slough, M.D.   PRIMARY CARE PHYSICIAN:  Nadine Counts, MD.   REASON FOR CONSULTATION:  Cardiac preoperative risk stratification.   HISTORY OF PRESENT ILLNESS:  [name] is a very pleasant 77 year old  male with a history of coronary artery disease.  He is status post  bypass grafting in 1998.  In 1999 he had an inferior infarct and  underwent stenting of the vein graft to the right coronary artery;  ejection fraction was 62%.  In 2007 he had an episode of presyncope and  underwent cardiac catheterization by Dr. Excell Seltzer; this showed patent  grafts with an EF of 50%.  He also has a history of hypertension and  hyperlipidemia.   He recently underwent back surgery in January 2009.  After the surgery  he developed urinary retention and since that time has had an indwelling  Foley catheter.  He has been followed by urology and now scheduled for a  TURP and possible placement of a suprapubic urinary catheter.  He did  not have any cardiac complications with his back surgery.   He says that recently he has been fairly sedentary due to continued  problems with his back and his bladder.  He denies any orthopnea or PND.  No syncope.  No palpitations.   REVIEW OF SYSTEMS:  Notable for urinary retention, back and leg pain,  arthritis pain.  The remainder of the review of systems is negative  except for HPI and problem list.   PROBLEM LIST:  1. Coronary artery disease, as described above.  2. Hypertension.  3. Hyperlipidemia.  4. Spinal stenosis status post spinal surgery.  5. Urinary retention with benign prostatic hypertrophy.  6. Osteoarthritis.   CURRENT MEDICATIONS:  1. Altace 10 mg a day.  2. Lopressor 50 b.i.d.  3. Aspirin 81 mg a day.  4. Vytorin 10/40 mg.  5. TriCor 145.  6. Niacin 2000 mg a day.  7. Flomax 0.8 mg a day.  8. Avodart.   ALLERGIES:  PENICILLIN.   SOCIAL HISTORY:  He is retired.  He has a history of previous tobacco  use, none currently.  No significant alcohol use.   FAMILY HISTORY:  Notable for coronary artery disease.   PHYSICAL EXAMINATION:  He is in no acute distress.  He ambulates around  the Clinic without any respiratory difficulty.  Blood pressure 118/70,  heart rate 63, weight 175.  HEENT:  Normal.  NECK:  Supple.  There is no JVD.  Carotids are 2+ bilaterally without  any bruits.  There is no lymphadenopathy or thyromegaly.  CARDIAC:  He is barrel-chested.  PMI is not palpable.  He has very  distant heart sounds.  He had a regular rate and rhythm, with a soft  systolic ejection murmur at the left sternal border.  No rub or  gallop.  LUNGS:  Clear.  He does have mildly decreased breath sounds throughout.  ABDOMEN:  Soft, nontender, nondistended.  No hepatosplenomegaly, no  bruits, no masses.  Good bowel sounds.  EXTREMITIES:  Warm with no cyanosis, clubbing or edema.  He has an  indwelling Foley placed.   EKG:  Shows sinus rhythm at a rate of 63, with occasional PACs; no  significant ST-T wave abnormalities.   ASSESSMENT/PLAN:  1. Coronary artery disease.  This is stable without any evidence of      ongoing ischemia.  Given his catheterization just a couple of years      ago, and the fact that he tolerated his spinal surgery well, I      think he is at low risk for perioperative cardiac complications.  I      would have him proceed to surgery without any further cardiac      workup.  2. Pulmonary hypertension.  Well-controlled.  3. Hyperlipidemia.  He is followed by the Lipid Clinic.   DISPOSITION:  We will see him back in the Clinic in 6 months for follow-  up.  If there are any problems with his surgery, please do not hesitate  to call us.     Bevelyn Buckles. Bensimhon, MD  Electronically Signed    DRB/MedQ  DD: 06/01/2007  DT: 06/01/2007  Job #: [id number]   cc:   Aundra Millet, M.D.

## 2010-08-04 NOTE — Assessment & Plan Note (Signed)
[facility]CARE                            CARDIOLOGY OFFICE NOTE   MAHMOUD, BLAZEJEWSKI                        MRN:          [id number]  DATE:02/07/2007                            DOB:          Nov 05, 1928    PRIMARY CARE PHYSICIAN:  Nadine Counts, MD   INTERVAL HISTORY:  Mr. Dibello is a very pleasant 77 year old male with a  history of coronary artery disease. He is status post coronary artery  bypass grafting in 1988 and in 1999 he had an inferior infarct and  underwent stenting of the angioplasty of the vein graft to the right  coronary artery. Ejection fraction was 62%. In 2007, he had an episode  of pre-syncope and underwent cardiac catheterization by Dr. Excell Seltzer  showing a patent LIMA to the LAD, patent vein graft to the RCA and a  patent vein graft to the obtuse marginal. EF was 50%. He returns today  for a followup. He has been previously followed by Dr. Samule Ohm.   Remainder of his medical history is notable for:  1. Hypertension.  2. Hyperlipidemia. He has been followed in the Lipid Clinic.   He says overall he is doing fairly well. He does get short of breath  with just moderate activities such as changing a tire, but he says the  main thing that limits him is his leg pain. He can only walk probably  about 50 yards before he gets pain. He says if he leans over a cart or  something else that he feels much better. He has not had any rest pain.  He does have a couple of episodes of chest pain over the past year, but  nothing really significant. He is not having exertional symptoms. He  does have a history of tobacco use, but quit in 1963.   CURRENT MEDICATIONS:  1. Altace 10 mg a day.  2. Lopressor 50 b.i.d.  3. Aspirin 81.  4. Vytorin 10/20.  5. Tricor 145.  6. Niacin over-the-counter.  7. Fish oil.  8. Zantac.   PHYSICAL EXAMINATION:  He is an elderly male in no acute distress.  Ambulates around the clinic without any respiratory  difficulty. Blood  pressure is 150/78, heart rate 56, weight is 178.  HEENT: Normal.  NECK: is supple. No JVD. Carotids are 2+ bilaterally without bruits.  There is no lymphadenopathy or thyromegaly.  CARDIAC: He is barrel chested. PMI is not palpable. He has very distant  heart sounds. He has a regular rate and a rhythm. There appears to be a  soft systolic ejection murmur at the left sternal border. There is no  rub or gallop.  LUNGS:  Have mildly decreased expiratory phase, but otherwise clear.  ABDOMEN: Soft, nontender, nondistended. No hepatosplenomegaly. No  bruits. No masses.  EXTREMITIES: Are cold and a little bit pale, but he still has good hair  growth all the way down to his feet. There is no cyanosis, clubbing or  edema. Distal pulses however are not palpable. There is no ulceration.  Strength is normal.   EKG shows sinus bradycardia at a  rate of 56 with sinus arrhythmia. No  significant ST-T wave abnormalities.   ASSESSMENT/PLAN:  1. Coronary artery disease. He is doing well. No evidence of ongoing      ischemia. Recent catheterization looked good. Continue current      therapy.  2. Hyperlipidemia. Is followed by our Lipid Clinic.  3. Hypertension. Blood pressure here is elevated, but says at home his      blood pressure is doing well in the 110-120 range. This will be      followed by Dr. Harrison Mons.  4. Leg pain. His symptoms and examination are suggestive of spinal      stenosis. Will go ahead and get an MRI of his lumbosacral spine as      well as check ABI and lower extremity ultrasounds.  5. Dyspnea. I suspect he has at least a mild to moderate component of      chronic obstructive pulmonary disease due to his previous work at a      [employer] and smoking history. Will get PFTs to further sort this      out.   DISPOSITION:  Will see him back in three months for followup.     Bevelyn Buckles. Bensimhon, MD  Electronically Signed    DRB/MedQ  DD: 02/07/2007  DT:  02/07/2007  Job #: [id number]   cc:   Nadine Counts

## 2010-08-04 NOTE — Assessment & Plan Note (Signed)
[facility]                               LIPID CLINIC NOTE   HELIOS, KOHLMANN                        MRN:          [id number]  DATE:06/01/2007                            DOB:          1928-04-04    Mr. Stanek comes in follow up of his hyperlipidemia therapy which  includes Vytorin 10/40, Tricor 145 mg, over the counter Niacin 2 grams  daily and fish oil 8 grams per day.  His has been compliant with these.  His other medications are the same except for the addition of Flomax and  Avodart.  He has urinary catheter in place and has a prostate surgery  planned in the near future.  He denies any new muscle aches and pains.  He had back surgery recently and is feeling better after that.   PHYSICAL EXAMINATION:  Blood pressure is 110/55.  Heart rate is 66.  We  did not get a weight at this visit.   LABORATORY DATA:  Includes total cholesterol 170, triglycerides 259, HDL  39.4, LDL 88.6.   ASSESSMENT:  Mr. Drewes is doing pretty good, compliant with the same  therapy that he has been on for quite some time.  His triglycerides have  worsened slightly from 203 to 259.  His HDL, however, has improved from  33.1 to 39.4.  Not quite at goal of greater than 40.  LDL just slightly  improved from 99.1 to 88.6 but not yet at goal less than 70.  We talked  about increasing his Vytorin to 10/80.  He is still resistant to this  and in light of his recent surgery and another surgery to come, we would  rather not make any changes to his medications at this time, and his LDL  and HDL are turning in the right direction.   PLAN:  We are going to continue the same quadruple therapy.  I wished  him luck with his surgery to come, and we will follow up with him in 6  months and recheck a lipid and liver panel.      Charolotte Eke, PharmD  Electronically Signed      Rollene Rotunda, MD, [facility]  Electronically Signed   TP/MedQ  DD: 06/01/2007  DT: 06/02/2007  Job #:  [id number]

## 2010-08-04 NOTE — Op Note (Signed)
[name], [name] NO.:  [id number]   MEDICAL RECORD NO.:  [id number]          PATIENT TYPE:  INP   LOCATION:  0007                         FACILITY:  [facility]   PHYSICIAN:  Lindaann Slough, M.D.  DATE OF BIRTH:  Aug 06, 1928   DATE OF PROCEDURE:  06/23/2007  DATE OF DISCHARGE:                               OPERATIVE REPORT   PREOPERATIVE DIAGNOSIS:  Benign prostatic hyperplasia with urinary  retention and bladder stones.   POSTOPERATIVE DIAGNOSIS:  Benign prostatic hyperplasia with urinary  retention and bladder stones.   PROCEDURE:  1. Cystourethroscopy.  2. Ultrasonic cystolithalopaxy with removal of bladder stones.  3. Transurethral resection of prostate.  4. Suprapubic catheter placement.   ATTENDING SURGEON:  Danae Chen, M.D.   RESIDENT SURGEON:  Dr. [name] Kitten   INDICATIONS FOR PROCEDURE:  Mr. Cairns is a 77 year old white male who  has a past medical history significant for urinary retention requiring  Foley catheter drainage.  He did have imaging which included ultrasound  in the office, and the diagnosis of bladder stones was made.  He did  have a flexible cystourethroscopy in the office, but bladder stones were  not seen then.  He was informed of all risks, consequences and benefits  associated with the surgery, and informed consent was obtained  preoperatively.   PROCEDURE IN DETAIL:  The patient was brought the operating room and  placed in the supine position.  He was correctly identified by his  wristband, and an appropriate time-out was taken.  IV antibiotics were  administered.  General anesthesia was delivered.  Once adequately  anesthetized, he was placed in the dorsal lithotomy position with great  care taken to minimize the risk of peripheral neuropathy or compartment  syndrome.  His perineum was prepped and draped, and this included his  lower abdomen after it was clipped.   We began our procedure by performing rigid  cystourethroscopy with a 22-  French rigid cystoscopic sheath, 12-degree lens, and glycine as our  irrigant.  He did have trilobar benign prostatic hyperplasia with  coaptation in the midline.  Neither ureteral orifice was identified.  He  had multiple small cellules in the posterior bladder wall and one large  wide-mouth diverticulum.  Inside this diverticulum, two large bladder  stones were seen.  We then advanced a UHL probe through the  cystoscope  and delivered ultrasonic energy to the stones and broke them apart into  many pieces.  We then removed the probe and advanced a modified nitinol  basket, and we were able to remove some of the stone material.  We then  changed to a flexible grasper and removed the rest of the stone  material.  We then removed the cystoscope and utilized the male [employer] sounds and dilated him up to a size of 32-French.  We used a 56-  [place] resectoscope and thin wire loop and then performed a  transurethral resection of the prostate beginning in the 6 o'clock  position, creating a trough down to the surgical capsule extending from  the bladder neck  to the verumontanum.  We then systematically resected  the rest of his prostatic tissue circumferentially, including the apical  segment.  We then applied Bovie electrocautery coagulation to all  bleeders.  His bladder was drained, all the prostate chips were  irrigated, and then refilled, and there were no areas of significant  bleeding.  No areas of perforation were seen.  We then filled his  bladder and removed the resectoscope.  We then advanced a Lowsley  retractor retrograde through his urethra and tented up his lower  abdominal wall in the midline.  He was placed in steep Trendelenburg  position.  Once in this position, the Lowsley retractor tent now was  used to tent the skin.  We then cut down on this sharply with the  scalpel and then passed a Lowsley retractor through the anterior  abdominal wall.   A 16-French Foley catheter was then placed in its jaws  and brought through the skin, into the bladder, and out the urethra in  an antegrade fashion.  The Lowsley retractor was removed, and the  resectoscope was placed back into the bladder, following the catheter  back through the urethra.  Once in the bladder, the balloon to the  catheter was inflated with 5 mL of sterile water.  We then performed one  final inspection there were a couple of prostate chips in the large  diverticulum which were removed.  No free floating fragments of bladder  stone or prostate tissue was identified.  He had minimal bleeding from  prostatic fossa but was essentially dry.  We were able to visualize his  right ureteral orifice but were not able to see his left ureteral  orifice.  We then removed the resectoscope and placed a 24-French three-  way Foley catheter into the bladder, instilled 60 mL of sterile water in  its balloon and placed him on gentle traction.  This marked the end of  our procedure.   He tolerated the procedure well.  There were no complications.  Dr. Brunilda Payor  was present and participated in all aspects of the case.      Duane Boston, MD      Lindaann Slough, M.D.  Electronically Signed    BP/MEDQ  D:  06/23/2007  T:  06/23/2007  Job:  [id number]

## 2010-08-04 NOTE — Assessment & Plan Note (Signed)
[facility]                               LIPID CLINIC NOTE   SHAHID, FLORI                        MRN:          [id number]  DATE:03/07/2008                            DOB:          August 29, 1928    [name] was seen back in lipid clinic for further evaluation,  medication titration associated with hyperlipidemia in the setting of  documented coronary disease and peripheral vascular disease.  He has  been feeling and doing well.  Overall, he has had no muscle aches,  pains, fatigue, or other problems.  He has had no chest pain.  The cold  weather has limited his outside activity to some extent, but he is doing  well overall.  He has been compliant with his medications, though he did  run out of his statin.  [name] has been in his donut hole and thus  has had limited ability to obtain his medications.   PAST MEDICAL HISTORY:  Pertinent for hyperlipidemia, documented coronary  disease.  [name] is in his donut hole and thus has been without some  of his meds for some time.  He requests samples today.  His past medical  history is pertinent for recent back surgery and subsequent urinary  retention requiring Foley catheter placement for approximately 3 months.  He is currently taking Altace 10 mg daily, Lopressor 50 mg twice daily,  aspirin 81 mg daily, TriCor 145 mg daily, fish oil 8-9 g daily, Zantac  150 mg twice daily, niacin 500 mg tablets 4 daily, Vytorin 10/80 daily.   REVIEW OF SYSTEMS:  As stated in the HPI, otherwise negative.   DRUG ALLERGIES:  PENICILLIN.   PHYSICAL EXAMINATION:  Weight today is 165 pounds, blood pressure is  120/72, heart rate is 60, respirations are 17.   LABORATORY DATA:  Reveal, on February 23, 2008, normal LFTs, total  cholesterol 174, triglycerides 190, HDL 33.1, LDL of 103.   ASSESSMENT:  The patient has been out of his Vytorin.  He has had some  loss of control of his cholesterol in part due to donut hole  status.   PLAN:  We will switch the patient to Vytorin 10/40 and provide him with  samples today.  We will have him continue on his niacin and his TriCor,  have given him some fenofibric acid 135 mg capsules to take one a day in  place of his other prescription therapy and have given him a script  switching him to fenofibrate 160 mg daily in the generic formulation.  He will follow up with [place] in 3-4 months.      Shelby Dubin, PharmD, BCPS, CPP  Electronically Signed      Rollene Rotunda, MD, [facility]  Electronically Signed   MP/MedQ  DD: 04/04/2008  DT: 04/05/2008  Job #: [id number]   cc:   Bevelyn Buckles. Bensimhon, MD

## 2010-08-04 NOTE — Assessment & Plan Note (Signed)
[facility]                               LIPID CLINIC NOTE   Brian, Alexander                        MRN:          [id number]  DATE:10/13/2006                            DOB:          1928-12-05    The patient is seen back in the lipid clinic for further evaluation and  medication titration.  States he has hyperlipidemia in the setting of  documented coronary disease, as well as significant peripheral vascular  disease.  [name] has been feeling okay since his last visit, though  he does continue to complain of leg pain specifically in his right lower  extremity from the knee to the ankle.  He has not been compliant with  physical activity due to the continuous leg pain, which is virtually  unchanged from his last visit.  He has not progressed with his exercise  since our may discussion.  The patient has continued to follow a low-  fat, low-cholesterol diet.  He has not noted as many fried foods in his  intake since last visit.   PAST MEDICAL HISTORY:  1. Hypertension.  2. Hyperlipidemia.  3. Documented coronary disease.   CURRENT MEDICATIONS:  1. Altace 10 mg daily.  2. Lopressor 50 mg twice daily.  3. Enteric-coated aspirin 81 mg daily.  4. Vytorin 10/20 one tablet daily.  5. TriCor 145 mg daily.  6. Niacin OTC 2 g daily at bedtime.  7. Fish oil 8 to 9 capsules daily.  8. Zantac 150 mg daily.   Please note, the patient has been substituting the Vytorin 10/20 with  half of the Vytorin 10/40 tablet since his last visit.   ALLERGIES:  PENICILLIN.   REVIEW OF SYSTEMS:  As stated in the HPI.  Otherwise, negative.   PHYSICAL EXAM:  Weight today in the office 175.25 pounds.  Blood  pressure is 124/80, heart rate is 68, respirations 17.  Labs have been  appended to the chart.  They include normal LFTs.  Total cholesterol  within appropriate parameters, except an LDL of 95, triglycerides 156.   ASSESSMENT:  The patient has documented  coronary artery disease, LDL  goal less than 70, though he is maintained on 5 drugs.  This is better  than last time, however, probably not realistic to obtain an overall  reduction to 70.   PLAN:  The patient will continue on his current therapies.  He will  follow up with questions or problems in the meantime, and a visit in 8  weeks and we will see him back at that time.      Shelby Dubin, PharmD, BCPS, CPP  Electronically Signed      Rollene Rotunda, MD, [facility]  Electronically Signed   MP/MedQ  DD: 10/13/2006  DT: 10/13/2006  Job #: [id number]   cc:   Veverly Fells. Excell Seltzer, MD  Bevelyn Buckles. Bensimhon, MD

## 2010-08-04 NOTE — Assessment & Plan Note (Signed)
[facility]                               LIPID CLINIC NOTE   [name], [name]                        MRN:          [id number]  DATE:06/06/2008                            DOB:          November 28, 1928    Return office visit for Lipid Clinic.   PAST MEDICAL HISTORY:  Hyperlipidemia, coronary artery disease, status  post bypass grafting in 1999.  He had an inferior wall MI in 2007  underwent stenting of a vein graft to his RCA.  His ejection fraction  was 50%.  He also complains of osteoarthritis and severe back pain,  COPD.   MEDICATIONS:  1. Altace 10 mg daily.  2. Lopressor 50 mg twice daily.  3. Aspirin 81 mg daily.  4. Tricor 145 mg daily.  5. Fish oil 1 g 8 capsules daily.  6. Zantac 150 mg twice daily.  7. Niaspan 2000 mg daily.  8. Vytorin 10/40 mg daily.   VITAL SIGNS:  Weight was 168 pounds, blood pressure was 110/64, heart  rate was 80.   LABORATORY DATA:  Total cholesterol 254, triglyceride 202, HDL 33, LDL  176.  He admits to being off his medications for financial reasons for  the last 3-4 weeks.   ASSESSMENT:  [name] is a pleasant gentleman who returns to Lipid  Clinic today with no chest pain, no shortness of breath, no muscle aches  or pains.  He states compliance with medications whenever they are  available and funds are available; however, he frequently is without  funds to pay for his medications.  He relies heavily on samples have  been supplied to him.  We had a lengthy conversation regarding the  importance of medication compliance.  He is agreeable to switch from  Vytorin to generic simvastatin to then be able to pay a generic copay  and says that he will be more compliant at that point in time.  Samples  were given to him of Vytorin 10/40 today and after cover for 2 months  and then to fill a prescription of simvastatin 40, which will be sent to  [facility].  His total cholesterol is  greater  than goal of less than 200.  Triglycerides are greater than goal of less  than 150, HDL less than goal of greater than 40, and LDL is  significantly higher and greater than goal of less than 70.  I believe  that would be very challenging to attain a goal of less than 70 in this  gentleman.  He is noncompliant with an exercise regimen, given that his  back pain is so severe that it is very challenging for him to move  about.  He has a very sedentary life.  He does admit to dietary  indiscretions.  He says that he has not a big snacker; however, he does  frequently like to enjoy his desserts and not very often does he limit  himself to 1 cookie.  He also does say that he snacks on chips and other  unhealthier items during the  day.  I have encouraged him to reach for  apples, bananas, pears, strawberries, blueberries, or vegetables to  decrease the amount of fats in his diet, and increased the amount of  fiber.  He seems to be somewhat willing to make some small changes in  his diet, but I do not anticipate any large changes in his lifestyle  modification.  I did give him samples of some arm exercises to do with  cans of vegetables and instead to help with some type of exercise since  he is unable to do any aerobic exercise given his severe back pain.  He  somewhat laps at the idea, but his wife did say that she would be  willing to help and force or encourage the exercise regimen at home.  As  far as meals are concerned, they rarely out.  She does fix lean protein  for meals, they rarely eat red meat, and she rarely fries any food.  It  does seem to be the portion sizes are the hardest for him to control  during his meals.   PLAN:  1. To continue Vytorin 10/40 until samples are finished about 2 months      worth then change to simvastatin 40 mg daily, potentially could      supplement with Zetia samples as we have them, potentially could      increase in simvastatin 280 mg on the  future.  2. Continue to encourage lifestyle modification.  3. Followup lipid panel for LFTs and make adjustment as needed at that      time in about 4 months.      Leota Sauers, PharmD  Electronically Signed      Jesse Sans. Daleen Squibb, MD, [facility]  Electronically Signed   LC/MedQ  DD: 06/06/2008  DT: 06/07/2008  Job #: [id number]

## 2010-08-04 NOTE — Assessment & Plan Note (Signed)
[facility]                               LIPID CLINIC NOTE   AVERILL, PONS                        MRN:          [id number]  DATE:08/18/2006                            DOB:          1928-06-27    Return office visit for lipid clinic.   PAST MEDICAL HISTORY:  1. Hyperlipidemia.  2. Hypertension.  3. Coronary artery disease, status post coronary artery bypass graft      in 1988.  4. Stent to the vein graft to the RCA in 1999 after inferior wall MI.      EF of 50%.   MEDICATIONS:  1. Altace 10 mg daily.  2. Lopressor 50 mg twice daily.  3. Aspirin 81 mg daily.  4. Fish oil 1 gm 8 to 9 capsules daily.  5. Vytorin 10/20 mg daily.  6. Tricor 145 mg daily.  7. Zantac 150 mg daily.  8. Niacin OTC 2000 mg daily.   VITAL SIGNS:  Weight 173 pounds.  Blood pressure 112/68.  Heart rate 74.   LABORATORY DATA:  Total cholesterol 181, triglycerides 184, HDL 41, LDL  103.  LFTs within normal limits.   ASSESSMENT:  Mr. Torry is a pleasant gentleman who returns to the lipid  clinic today with no chest pain, no shortness of breath, no muscles  aches or pains.  He is compliant with current medication regimen,  however, he does not do any exercise, given that he has a leg injury  from childhood where he had surgery, and he says that his muscle is  tight and painful, and it prevents him walking and exercising very much.  I have encouraged him to restart exercise program very slowly.  He says  he does do bicycle kicks in the evening occasionally.  I have encouraged  him to over a 10-minute period do bicycle kicks for a minute, and then  take a rest for a minute.  Exercise for a minute, and take a rest for a  minute, for a total of 10 minutes, and to increase this as he tolerates,  and to also walk approximately 10 minutes a day around his yard or farm,  or in his neighborhood, which ever he deems safest.  He states that he  does not know if he can make it  10 minutes at one time, so I have  encouraged to walk as long as he can until his leg bothers him, to rest  for a few minutes, and then restart exercising.  I will continue to  encourage this, and to increase his exercise as tolerated.  He follows a  fairly low-fat and low-carbohydrate diet.  I have given him some food  alternatives to the carbohydrates and fried foods that he was eating.  He also feels if he would lose weight he would feel better.  He has  stopped eating except for 1 large meal during the day.  I explained to  him that it is better to continue to keep his metabolism going by eating  3 to 4 small meals daily versus 1  large meal.  I have also given him  food choices for breakfast, lunch, and dinner.  His total cholesterol is  at goal of less than 200, triglycerides greater than goal of less than  150, HDL at goal of greater than 100, LDL greater than goal of less than  70.   PLAN:  1. Increase Vytorin to 10/40 mg daily.  2. Begin exercise regimen.  3. Decrease carbohydrates and fat in diet.  4. Followup visit for 3 months for liver panel and LFTs.  We will make      adjustments that will be needed at that time.      Leota Sauers, PharmD  Electronically Signed      Jesse Sans. Daleen Squibb, MD, [facility]  Electronically Signed   LC/MedQ  DD: 08/18/2006  DT: 08/18/2006  Job #: [id number]

## 2010-08-04 NOTE — Assessment & Plan Note (Signed)
[facility]                               LIPID CLINIC NOTE   Brian, Alexander                        MRN:          [id number]  DATE:11/30/2007                            DOB:          06-19-1928    [name] comes in today for followup of his hyperlipidemia therapy  which includes TriCor 145 mg daily, Vytorin 10/40 daily, fish oil 8-9 g  daily, and Niaspan 2 g daily.  He has been compliant with all of these  therapies and has been tolerating them just fine.   PHYSICAL EXAMINATION:  He is in no acute distress.  Blood pressure is  112/70, heart rate is 58.   LABORATORY DATA:  Total cholesterol 201, triglycerides 195, HDL 27.2,  LDL 122.  Liver function tests were within normal limits   ASSESSMENT:  [name] triglycerides are greatly improved and  cholesterol goal was less than 150; however, his HDL has decreased and  is now farther from the goal of greater than 45.  His LDL had also  worsened and has risen from 88.6-122 and is not at the goal of less than  70.  I believe [name] and his wife who accompanies him here today  that he has been compliant with all of these therapies.  We talked about  diet and the wife relates that they eat very pretty healthy, avoiding  red meat almost entirely, lot of [place] and chicken, and lean meats.  She does not fry many foods, they do not go back out to eat very often.  He has egg substitute for breakfast a lot of mornings.  Exercise is very  limited due to his osteoarthritis.   PLAN:  As the TriCor, fish oil, and Niaspan are already at maximal  doses, we are going to increase his Vytorin to 10/80 which is at its  maximum dose.  I encouraged him and his wife to continue with heart  healthy diet and asked him to exercise as much as tolerated.  We will  follow up with him in 3 months with a repeat lipid and liver panel.  He  was instructed to call with any problems or concerns in the meantime.  A  prescription was electronically sent to [facility] for Vytorin 10/80.      [name], PharmD  Electronically Signed      Brian Rotunda, MD, [facility]  Electronically Signed   TP/MedQ  DD: 11/30/2007  DT: 12/01/2007  Job #: [id number]

## 2010-08-04 NOTE — Op Note (Signed)
[name], [name] NO.:  [id number]   MEDICAL RECORD NO.:  [id number]          PATIENT TYPE:  INP   LOCATION:  3172                         FACILITY:  [facility]   PHYSICIAN:  Danae Orleans. Venetia Maxon, M.D.  DATE OF BIRTH:  1928/11/19   DATE OF PROCEDURE:  03/30/2007  DATE OF DISCHARGE:                               OPERATIVE REPORT   PREOPERATIVE DIAGNOSIS:  1. Lumbar stenosis.  2. Lumbar scoliosis.  3. Spondylolisthesis.  4. Radiculopathy.   POSTPROCEDURE DIAGNOSIS:  1. Lumbar stenosis.  2. Lumbar scoliosis.  3. Spondylolisthesis.  4. Radiculopathy.   PROCEDURES:  L3-4 X STOP device.   SURGEON:  Danae Orleans. Venetia Maxon, M.D.   ANESTHESIA:  General endotracheal anesthesia.   BLOOD LOSS:  Minimal.   COMPLICATIONS:  None.   DISPOSITION:  To recovery.   INDICATIONS:  [name] is a 77 year old man with significant cardiac  disease with a severe case of lumbar scoliosis and stenosis with  foraminal stenosis, most pronounced at the L3-4 level on the right.  It  was elected because of comorbidities, and the severity of his scoliosis  to perform an X STOP procedure since he gets relief with sitting, and  leaning forward.   DESCRIPTION OF PROCEDURE:  [name] was brought to the operating room.  Following satisfactory and uncomplicated induction of general  endotracheal anesthesia, and placement of intravenous lines, the patient  was placed in the prone position on the Wilson frame.  His low back was  prepped and draped in the usual sterile fashion.  Using C-arm AP and  lateral fluoroscopy, the L3-4 interspace was identified.  The skin and  subcutaneous tissues were infiltrated with 1/4% Marcaine and 1/2%  lidocaine and 1: 200,000 epinephrine.   Incision was made in the midline, carried through lumbodorsal fascia  bilaterally exposing the L3-4 interspace.  Sequential dilators were  placed followed by the X STOP distractor tool.  He was sized for a 14 mm  implant.  A  14 mm PEEK implant was then placed, and locking wing was  tightened.  Positioning was confirmed on AP and lateral  fluoroscopy.  The wound was irrigated, closed with interrupted Vicryl  sutures, and a sterile occlusive dressing was placed.  Dermabond was  placed on the skin.  A sterile occlusive dressing was placed.  The  patient was taken to recovery having tolerated the procedure well.      Danae Orleans. Venetia Maxon, M.D.  Electronically Signed     JDS/MEDQ  D:  03/30/2007  T:  03/30/2007  Job:  [id number]

## 2010-08-04 NOTE — H&P (Signed)
NAMEJIMMYLEE, RATTERREE NO.:  [id number]   MEDICAL RECORD NO.:  [id number]          PATIENT TYPE:  INP   LOCATION:                               FACILITY:  [facility]   PHYSICIAN:  Lindaann Slough, M.D.  DATE OF BIRTH:  [birth year]   DATE OF ADMISSION:  06/23/2007  DATE OF DISCHARGE:                              HISTORY & PHYSICAL   CHIEF COMPLAINT:  Inability to urinate.   HISTORY OF PRESENT ILLNESS:  The patient is a 77 year old male who had  back surgery in January 2009 and he went into urinary retention postop.  He was started on Flomax but he has been unable to void since.  Cystoscopy showed  trilobar prostatic hypertrophy and a bladder  diverticulum.  I did not see a stone in the diverticulum.  However, on  ultrasound there is a possible stone in the diverticulum.  Video  urodynamic studies showed an obstructive pattern.  Since then the  patient has been unable to void.  He is admitted now for TURP and  possibly EHL of the bladder stone.   PAST MEDICAL HISTORY:  Is positive for coronary artery disease,  hypertension, hyperlipidemia.   PAST SURGICAL HISTORY:  He had coronary artery bypass surgery in 1988  and back surgery in January 2009.   SOCIAL HISTORY:  He is married, has 1 daughter, quit smoking about 46  years ago and does not drink.   FAMILY HISTORY:  His mother died at age 60.  His father died at age 12  of a stroke.   MEDICATIONS:  1. Vytorin.  2. Lopressor.  3. TriCor.  4. Altace.  5. Niacin.  6. Fish oil.  7. Zantac.   ALLERGIES:  He is allergic to PENICILLIN.   REVIEW OF SYSTEMS:  Is as noted in the HPI and everything else is  negative.   PHYSICAL EXAMINATION:  GENERAL:  This is a well-developed 77 year old  male who is in no acute distress.  He is alert and oriented.  VITAL SIGNS:  Blood pressure is 137/86, pulse 79, respirations 20,  temperature 96.9.  HEENT:  His head is normal.  Pupils are equal and reactive to light and  accommodation.  Ears, nose and throat are within normal limits.  NECK:  Supple.  No cervical lymph nodes, no thyromegaly.  CHEST:  Symmetrical.  LUNGS:  Lungs are fully expanded and clear to percussion and  auscultation.  HEART:  Regular rhythm.  No murmurs, no gallops.  ABDOMEN:  Abdomen is soft, nondistended and he has no CVA tenderness.  Kidneys are not palpable.  There is no hepatomegaly nor splenomegaly.  Bladder is not distended.  Bowel sounds are normal.  GENITALIA:  Penis and scrotal contents are within normal limits.  He had  a Foley catheter that is draining clear urine.  RECTAL:  Sphincter tone is normal.  Prostate is enlarged to 40 grams.  No nodules.  Seminal vesicles not palpable.  EXTREMITIES:  Are within normal limits.   IMPRESSION:  Acute urinary retention, benign prostatic hyperplasia,  coronary artery disease, hypertension, hyperlipidemia.  Lindaann Slough, M.D.  Electronically Signed     MN/MEDQ  D:  06/23/2007  T:  06/23/2007  Job:  [id number]

## 2010-08-07 NOTE — Assessment & Plan Note (Signed)
[facility]                               LIPID CLINIC NOTE   FINDLAY, DAGHER                        MRN:          [id number]  DATE:04/21/2006                            DOB:          Mar 03, 1929    Mr. Lanum comes in today for follow up of his hypercholesterolemia  therapy for which his current medications are:  1. Niacin over-the-counter 2 g daily at bedtime.  2. Over-the-counter fish oil capsules 8-9 g per day.  3. TriCor 145 mg daily.  4. Vytorin 10/20 one tablet daily.   OTHER MEDICATIONS:  Are unchanged, and include:  1. Altace.  2. Lopressor.  3. Enteric coated aspirin 81 mg.  4. Zantac.   He has no acute complaints.  He reports the leg cramping has continued  but is no worse and not any more frequent.  Continues to be bilateral  upper and lower leg cramping at night or with exercise.   PHYSICAL EXAM:  Weight of 178, blood pressure 120/70, heart rate 60.   LABORATORY DATA:  Total cholesterol 153, triglycerides 214, HDL 43.4,  LDL 81.4, LFTs within normal limits.   ASSESSMENT:  Mr. Hume has been compliant with his medications and is  tolerating them just fine.  His HDL is at goal of greater than 40, but  his triglycerides are still not at goal of less than 150, and LDL is  still not at goal of less than 70; however, triglycerides and LDL are  both improved from his previous visit in December, 2007.   PLAN:  1. I am not going to change any of his medications at this time.  2. We may encourage him to continue with as much exercise as he can      tolerate and a heart healthy diet.  3. We are going to follow up with him in 4 months and recheck his labs      and make any adjustments at that time.      Charolotte Eke, PharmD  Electronically Signed      Madolyn Frieze. Jens Som, MD, [facility]  Electronically Signed   TP/MedQ  DD: 04/21/2006  DT: 04/21/2006  Job #: [id number]

## 2010-08-07 NOTE — H&P (Signed)
[name], [name] NO.:  [id number]   MEDICAL RECORD NO.:  [id number]          PATIENT TYPE:  EMS   LOCATION:  MAJO                         FACILITY:  [facility]   PHYSICIAN:  Brian Friends. Dietrich Pates, MD, FACCDATE OF BIRTH:  [birth year]   DATE OF ADMISSION:  12/23/2005  DATE OF DISCHARGE:                                HISTORY & PHYSICAL   PRIMARY CARDIOLOGIST:  Dr. Randa Alexander   PRIMARY CARE PHYSICIAN:  The patient does not have a primary care physician.   PATIENT PROFILE:  A 77 year old white male with prior history of CAD who  presents with unstable angina and syncope.   PROBLEMS:  1. Unstable angina/coronary artery disease.      a.     Status post inferior myocardial infarction in 1988.      b.     Status post coronary artery bypass grafting x3in 1988 with left       internal mammary artery to left anterior descending, vein graft to the       obtuse marginal, vein graft to the right coronary artery.      c.     September 1999, status post percutaneous coronary intervention       and stenting with bare metal stent of the vein graft to the right       coronary artery.      d.     September 27, 2003, exercise Myoview, maximal heart rate 129 beats       per minute.  He achieved 7 mets.  He had no EKG changes.  Ejection       fraction was 62% with inferobasal hypokinesis.  There was no ischemia.       However, there was prior inferobasal infarct noted.  2. Hypertension.  3. Hyperlipidemia.  4. Hypertriglyceridemia.  5. Gastroesophageal reflux disease/peptic ulcer disease.  6. History of postoperative atrial fibrillation in 1988.   HISTORY OF PRESENT ILLNESS:  A 77 year old married white male with history  of CAD status post CABG x3 in 1988 following an inferior MI and subsequent  PCI and stenting of the vein graft to the RCA in 1999.  His last cath was at  that time, although he did have a nonischemic  exercise Myoview in July  2005.  At baseline, he does experience  some degree of dyspnea on exertion at  home as well as bilateral lower extremity, right equal to left hip, thigh  and calf cramping and claudication after walking less than a quarter of a  mile lasting 5-10 minutes and resolving by sitting down and resting.  He  denies experiencing frequent chest discomfort at home, although over the  past two nights, he has had some chest discomfort and indigestion and leg  pain similar to his previous angina coming on at rest and resolving  spontaneously.  Today he was a passenger in a car as a friend was driving  him to the [employer] office for some routine blood work and he developed  8/10 substernal chest pressure and dull aching without associated symptoms.  He took  one sublingual nitroglycerin and probably developed diaphoresis,  nausea, shortness of breath and lightheadedness as well as dimming of  vision with subsequent syncope.  His friend noted him to be out for about a  minute or two and his friend drove him to the [place] office, which they were  very nearby anyway.  Once in the parking lot at the [place] office, [name]  regained consciousness and noted that he did not have any more chest pain  but did feel drunk.  He then walked into the office, went to the lab, had  his lab work and then left without asking for additional care.  His friend  drove back home and when he returned home, he spoke to his wife who then  called our office and they were advised to come into the ED for additional  evaluation.  The patient is currently pain-free and EKG is without any acute  changes.   ALLERGIES:  PENICILLIN.  He has also noted a cough over the past 2 years and  is on ACE INHIBITOR.   CURRENT MEDICATIONS:  1. Altace 10 mg daily.  2. Niaspan 1000 mg b.i.d.  3. Lopressor 50 mg b.i.d.  4. Aspirin 81 mg daily.  5. Fish oil daily.  6. Vytorin 10/20 mg daily.  7. Tricor 145 mg daily.  8. Zantac 150 mg daily.  9. Allegra 180 mg p.r.n.   FAMILY  HISTORY:  Mother died of old age and questionable CHF at age 30.  Father died of hypertension and questionable MI at age 51.   SOCIAL HISTORY:  [place] his wife and  daughter.  He is retired.  He has a 20 pack-year history of tobacco abuse  quitting in May 04, 1961.  He denies any alcohol or drugs and is not  currently exercising.  He does not stick to any sort of diet.   REVIEW OF SYSTEMS:  Positive for chest pain, shortness of breath, dyspnea on  exertion, presyncope and syncope occurring today after nitroglycerin as well  as bilateral lower extremity claudication as outlined in the HPI.  He also  has GERD symptoms with a history of peptic ulcer disease and takes Zantac at  home.  Otherwise, all systems reviewed and negative.   PHYSICAL EXAMINATION:  GENERAL APPEARANCE:  Pleasant white male in no acute  distress.  Awake, alert and oriented x3.  VITAL SIGNS:  Vital signs are pending.  HEENT: Normocephalic and atraumatic.  NECK:  No bruits, no JVD.  LUNGS:  Respirations regular and unlabored.  CARDIAC:  Regular, distant S1 and S2.  No S3, S4 or murmurs.  ABDOMEN:  Round, soft, nontender and nondistended.  Bowel sounds present x4  EXTREMITIES:  Warm, dry, pink.  No clubbing, cyanosis or edema.  Dorsalis  pedis and posterior tibial pulses 2+ bilaterally.  There are no groin  bruits.  NEUROLOGIC:  Cranial nerves II-XII grossly intact and otherwise exam is  nonfocal.   Chest x-ray is pending.   EKG shows sinus rhythm with no acute ST-T changes.   LABORATORY DATA:  Lab work is pending.   ASSESSMENT/PLAN:  1. Unstable angina/coronary artery disease:  Story concerning for angina.      Admit and cycle cardiac markers.  Will add heparin and continue beta      blocker, aspirin, Statin and fibrate.  Will change his ACE to an ARB as     he has had a cough for about 2 years, which  he describes as a dry      nonproductive tickling, nagging cough.  Plan  on cath possibly today if      we can get the lab work in time.  2. Hypertension.  Continue beta blocker and will change ACE to ARB as      above.  3. Hyperlipidemia.  He is followed in our lipid clinic.  Continue home      regimen.  4. Claudication.  We can arrange for follow with Dr. Samule Ohm with      outpatient ABIs is not already performed.  He does have good pulses and      there is no evidence of bruits.  5. Gastroesophageal reflux disease.  Continue H2 blocker.  6. Syncope likely related to hypotension brought on by sublingual      nitroglycerin usage.  Evaluate ejection fraction on cath.     ______________________________  Nicolasa Ducking, ANP      Brian Friends. Dietrich Pates, MD, [facility]  Electronically Signed    CB/MEDQ  D:  12/23/2005  T:  12/24/2005  Job:  [id number]

## 2010-08-07 NOTE — H&P (Signed)
NAMEGIANNIS, [name] NO.:  [id number]   MEDICAL RECORD NO.:  [id number]                   PATIENT TYPE:  EMS   LOCATION:  MAJO                                 FACILITY:  [facility]   PHYSICIAN:  Salvadore Farber, M.D.             DATE OF BIRTH:  01-08-1929   DATE OF ADMISSION:  08/22/2002  DATE OF DISCHARGE:  08/22/2002                                HISTORY & PHYSICAL   HISTORY OF PRESENT ILLNESS:  [name] is a 77 year old gentleman with  coronary artery disease status post coronary artery bypass grafting in 1988  after suffering inferior myocardial infarction.  In September 1999, he  underwent stenting of his saphenous vein graft to the RCA.  At that time,  LIMA to the LAD and saphenous vein graft to OM were both widely patent.  Ejection fraction was last assessed September 2002 when it was 64%.   He presents today to the office for previously scheduled routine followup.  During the visit, he described an episode of squeezing bilateral chest  discomfort beginning at rest yesterday and lasting for approximately 30  minutes.  This symptom was accompanied by diaphoresis and mild nausea.  There was no associated dyspnea.  The pain did not radiate.  He did not take  nitroglycerin for this pain.  He has had no recurrence since then.   CURRENT MEDICATIONS:  1. Altace 5 mg per day  2. Niaspan 2 g per day.  3. Lopressor 50 mg twice per day.  4. Zantac 150 mg twice per day.  5. Enteric-coated aspirin 325 mg daily.  6. Fish oil.  7. Sublingual nitroglycerin which he has not used recently.   PAST MEDICAL HISTORY:  1. Coronary disease as above.  2. Hypertriglyceridemia.  3. Hypertension.  4. History of peptic ulcer disease (very distant past).  5. Transient atrial fibrillation after bypass grafting.   ALLERGIES:  PENICILLIN.   SOCIAL HISTORY:  The patient is married.  Denies tobacco use.   FAMILY HISTORY:  Noncontributory.   REVIEW OF SYSTEMS:   Negative in detail except as above.   PHYSICAL EXAMINATION:  GENERAL:  Well-appearing man in no distress.  VITAL SIGNS:  Heart rate 69, blood pressure 120/75, weight 180 pounds.  NECK:  No jugular venous distention.  LUNGS:  Clear to auscultation and percussion bilaterally.  HEART:  Regular rate and rhythm with normal S1 and S2.  There is no murmur  or S3.  There is a soft S4.  ABDOMEN: Soft, nontender, nondistended.  There is no hepatosplenomegaly.  Bowel sounds are normal.  There is no mass.  EXTREMITIES:  Warm without clubbing, cyanosis, edema, or ulceration.  PULSES:  Carotid pulses 2+ bilaterally without bruits.   LABORATORY DATA:  Electrocardiogram today demonstrates normal sinus rhythm  with marked sinus arrhythmia.  It is a normal EKG.   IMPRESSION/RECOMMENDATIONS:  1. Chest discomfort:  Symptoms are fairly  classic for myocardial ischemia.     Will, therefore, transfer him from office to hospital for admission.     Will treat him with continued aspirin.  Will administer Plavix 300 mg in     the office and continue him on 75 mg per day in hospital.  Will     administer low-molecular-weight heparin.  Will check serial cardiac     enzymes.  If normal, will plan on exercise testing.  If abnormal or     patient has recurrent discomfort, will proceed to cardiac     catheterization.  2. Dyslipidemia:  Check fasting lipid profile in the morning.                                               Salvadore Farber, M.D.    WED/MEDQ  D:  09/18/2002  T:  09/18/2002  Job:  [id number]

## 2010-08-07 NOTE — Cardiovascular Report (Signed)
NAMEMANFORD, SPRONG NO.:  [id number]   MEDICAL RECORD NO.:  [id number]          PATIENT TYPE:  INP   LOCATION:  1847                         FACILITY:  [facility]   PHYSICIAN:  Bevelyn Buckles. Bensimhon, MDDATE OF BIRTH:  27-Nov-1928   DATE OF PROCEDURE:  12/23/2005  DATE OF DISCHARGE:                              CARDIAC CATHETERIZATION   CARDIOLOGIST:  Dr. Samule Ohm.   PATIENT IDENTIFICATION:  [name] is a very pleasant 77 year old male with  a history of coronary artery disease, status post bypass surgery in 1988 at  the setting of an inferior MI.  His last catheterization was 1999 when he  had a bare metal stent to his saphenous vein graft to the RCA.  He was doing  rather well until this afternoon when he had acute onset of 8/10 chest pain.  While riding in the car ride, he took one sublingual nitroglycerin and he  became diaphoretic and short of breath and had a syncopal episode.  He came  to spontaneously and eventually went home and then his wife had him come to  the emergency room.  EKG showed an old inferior myocardial infarction with  no acute ST-T wave changes.  Cardiac enzymes were still pending.  He was  brought to the catheterization lab for diagnostic angiography.   PROCEDURES PERFORMED:  1. Selective coronary angiography.  2. Saphenous vein graft angiography x2.  3. LIMA angiography.  4. Left heart cath.  5. Left ventriculogram.  6. Angio-Seal.   DESCRIPTION OF PROCEDURE:  The risks and benefits of the catheterization  were explained.  Consent was signed and placed on the chart.  A 6-French  arterial sheath was placed in the right femoral artery using a modified  Seldinger technique.  Standard catheters, including a JL4, a JR4, and angled  pigtail were used for the catheterization.  The JR4 was used for the IMA.  The RCB catheter was used for the saphenous vein graft to the RCA.  There  were no apparent complications.   Central aortic pressure is  113/55 with a mean of 80.  LV pressure was 121/2  with an EDP of 12.  There was no aortic stenosis.   Left main was short, angiographically normal.   LAD gave off a moderate-sized branching diagonal and a septal perforator  before being totally occluded in the midsection.  There was a long 80%  lesion throughout the proximal to midsection.   Left circumflex was totally occluded after giving off a small marginal  branch.   Right coronary artery was totally occluded proximally.   LIMA to the LAD was widely patent.  There was some moderate diffuse disease  in the LAD proper.  There was a 50% lesion in the lower branch of the  diagonal   Saphenous vein graft to the OM2 was widely patent.  Saphenous vein graft to  the distal RCA was patent.  There was a previously placed stent in the  midsection.  In the proximal portion of graft prior to the stent, there was  a 40-50% lesion followed by a large  valve.  Within the stent, there were  tandem 50-60% lesions, but these were not flow-limiting.  Right after the  stent, there was a valve in the distal vein graft.  The distal RCA was small  with moderate diffuse disease.  There was a small PDA and 2 small  posterolaterals.   Left ventriculogram shot with a hand injection showed an ejection fraction  of approximately 50%.   ASSESSMENT:  1. Severe native 3-vessel coronary artery disease as described above.  2. All grafts are patent with diffuse disease of the saphenous vein graft      the right coronary artery including tandem 50-60% in-stent restenosis.  3. No high-grade culprit lesion.  4. Ejection fraction of 50%.   PLAN/DISCUSSION:  I have reviewed the films with Dr. Riley Kill.  At this  point, there does not seem to be a culprit lesion.  He does have significant  disease in the saphenous vein graft to the right coronary, although this  does not appear significant enough to give him pain at rest.  We will  observe him overnight and  complete his rule out myocardial infarction.  We  will also check a D-dimer.  If he remains stable, he may be suitable for  discharge in the morning.      Bevelyn Buckles. Bensimhon, MD  Electronically Signed     DRB/MEDQ  D:  12/23/2005  T:  12/25/2005  Job:  [id number]

## 2010-08-07 NOTE — Discharge Summary (Signed)
[name], [name] NO.:  [id number]   MEDICAL RECORD NO.:  [id number]                   PATIENT TYPE:  INP   LOCATION:  3708                                 FACILITY:  [facility]   PHYSICIAN:  Salvadore Farber, M.D.             DATE OF BIRTH:  1928-06-26   DATE OF ADMISSION:  09/18/2002  DATE OF DISCHARGE:  09/19/2002                           DISCHARGE SUMMARY - REFERRING   HISTORY OF PRESENT ILLNESS:  [name] is 77 year old, white male who  presented to the office on September 18, 2002, for a previously scheduled routine  followup.  During the visit, he did describe an episode of squeezing  bilateral chest discomfort beginning at rest yesterday and lasting  approximately 30 minutes associated with diaphoresis and mild nausea.  He  did not use nitroglycerin and Dr. Samule Ohm admitted him for further  evaluation.  His history is notable for bypass surgery in 1988, after he had  an inferior myocardial infarction.  In September 1999, he underwent stenting  to his saphenous vein graft to the RCA.  At that time, LIMA to the LAD and  saphenous vein graft to the OM were patent and his EF was 64%.  His history  is also notable for hypertriglyceridemia, hypertension, peptic ulcer disease  and paroxysmal atrial fibrillation post bypass.   LABORATORY DATA AND X-RAY FINDINGS:  Admission H&H 15.7 and 45.1, normal  indices, platelets 136, WBC 6.2.  PT 12.5, PTT 28.  Sodium 138, potassium  360, BUN 11, creatinine 1.1, glucose 163.  Normal LFTs.  Two CK-MBs and  troponins were negative for myocardial infarction.   EKG showed normal sinus rhythm, normal access and nonspecific ST and T wave  changes.   HOSPITAL COURSE:  [name] was admitted to 3700.  Overnight, he did not  have any further episodes of chest discomfort.  Enzymes and EKGs were  negative for myocardial infarction.  A stress Cardiolite was performed on  September 19, 2002, revealing an EF of 62% with  diaphragmatic attenuation.  Post  review, Dr. Eden Emms felt that the stress test was unremarkable and that he  could be discharged home.   DISCHARGE DIAGNOSES:  Chest discomfort of undetermined etiology.   DISPOSITION:  He is discharged to home.   DISCHARGE MEDICATIONS:  1. Altace 5 mg q.d.  2. Niaspan 2 g q.d.  3. Lopressor 50 mg b.i.d.  4. Zantac 150 mg b.i.d.  5. Enteric coated aspirin 325 mg q.d.  6. Fish oil as previous.  7. Nitroglycerin as needed.   DIET:  Low fat, low cholesterol, low salt diet.    FOLLOW UP:  He is to call the office in the morning to arrange follow-up  appointment with Dr. Samule Ohm.  At the time of followup, lipid panel that was  drawn at [facility] on the morning of June 30, should be followed  up.  Joellyn Rued, P.A. [facility]                    Salvadore Farber, M.D.    EW/MEDQ  D:  09/19/2002  T:  09/19/2002  Job:  [id number]

## 2010-08-07 NOTE — Assessment & Plan Note (Signed)
[facility]                              CARDIOLOGY OFFICE NOTE   RYSHAWN, SANZONE                        MRN:          [id number]  DATE:11/04/2005                            DOB:          01-27-1929    Mr. Percival is doing well today.  He has been compliant with his medications  of over-the-counter fish oil capsules three capsules bid or tid as  tolerated, Tricor 145 mg daily, Vytorin 10/20 one tablet daily, over-the-  counter Niacin 2 grams at bedtime.  He continues to have leg pain and leg  cramps from time to time.  This has no increased in frequency or severity  since we have seen him last.  His diet continues to be about the same.  Exercise is limited due to leg and foot pains.  His weight today is 174  pounds, blood pressure 110/70, heart rate 59.   LABS:  Total cholesterol 165, triglycerides 216, HDL 35.6, LDL 90.2.  Liver  function tests are within normal limits except for a very slightly elevated  total bilirubin of 1.3.   ASSESSMENT:  Mr. Sondgeroth is continuing to have intermittent problems with  muscle aches, muscle pains on his current therapy.  He otherwise is  tolerating the increase of the Vytorin dose to one tablet daily.  His lipid  panel is not much improved.  LDL is down from 108 to 90.2.  His goal is an  LDL of less than 70.   PLAN:  We are going to continue the same medications for now and get another  eight week followup, at which time we may consider increasing the Vytorin  dose if his leg muscle aches and pains are not any worse.  I urged him to  continue to try and make improvements to his diet and exercise as tolerated.  I gave him samples of Tricor and Vytorin and asked him to please give [place] a  call in the meantime with any concerns or his muscle aches and pains become  more frequent or severe.                                 Charolotte Eke, PharmD    TP/MedQ  DD:  11/04/2005  DT:  11/04/2005  Job #:  [id number]

## 2010-08-07 NOTE — Assessment & Plan Note (Signed)
[facility]                              CARDIOLOGY OFFICE NOTE   [name], [name]                        MRN:          [id number]  DATE:12/30/2005                            DOB:          [birth year]    [name] comes in today.  He reports having a recent hospitalization due to  a syncopal episode and he received a cardiac catheterization with no  intervention.  His current cholesterol therapy is Vytorin 10/20, fish oil,  8000 to 9000 mg a day, Niacin over the counter 2 grams per day, and Tricor  145 mg per day.   His other medications have not changed, they are Lopressor, aspirin 81 mg,  Zantac, and Altace.   He continues to have some muscle aches in his hips and legs.  This has been  unchanged on his current therapy and has been a problem with him for  sometime but has not gotten any more frequent or more severe lately.  It  does require him to skip his Vytorin dose for 1-2 days at a time until it  goes away.   His diet includes no red meat, he eats a lot of fish and chicken, not fried.  He is really not able to exercise right now and probably needs clearance  from his cardiologist before resuming any sort of exercise program.   PHYSICAL EXAMINATION:  Weight 173 pounds, blood pressure 110/70, heart rate  66.   His laboratory data includes total cholesterol 159, triglycerides 187, HDL  43, LDL 79.  LFTs within normal limits.  Creatinine kinase is 55.   ASSESSMENT:  [name] LDL, HDL, and triglycerides are all improved from  last time after we made the increase from Vytorin 10/20 1/2 tablet daily to  a full tablet daily.  His muscle aches and pains could be due to the statin,  Niacin, and Tricor therapy but it has not gotten any worse even with dose  increases and the creatinine kinase is within normal limits.  His LDL is  much improved but not yet at goal.  His triglycerides are much improved,  also, but not yet at goal.   PLAN:  We  are going to continue with the same medications.  He was  instructed to call us with any increase in the severity or frequency of his  muscle aches and pains or if he has any other questions.  We are going to  see him back in six weeks for a lipid and liver panel and will recheck his  creatinine kinase, as well.    ______________________________  Charolotte Eke, PharmD    ______________________________  Rollene Rotunda, MD, [facility]    TP/MedQ  DD:  12/30/2005  DT:  12/31/2005 Job #:  [id number]

## 2010-08-07 NOTE — Assessment & Plan Note (Signed)
[facility]                              CARDIOLOGY OFFICE NOTE   CAMILLO, QUADROS                        MRN:          [id number]  DATE:01/26/2006                            DOB:          December 04, 1928    PRIMARY CARE PHYSICIAN:  Nadine Counts, M.D.   HISTORY OF PRESENT ILLNESS:  [name] is a 77 year old gentleman with  longstanding coronary disease.  He is status post coronary artery bypass  grafting in 1988.  He had percutaneous intervention on the vein graft to the  RCA in 1999 in a setting of inferior infarct.  Ejection fraction was 62%.  He was recently hospitalized having had an episode of fairly atypical chest  pain for which he took a nitroglycerin.  He then had presyncope and  presented to hospital.  He underwent cardiac catheterization by Dr. Excell Seltzer,  demonstrating patent LIMA to LAD, vein graft to RCA, and vein graft to OM.  EF was 50%.  He was discharged home.  Post hospital course has been  uncomplicated.  He has had no recurrent chest discomfort and no recurrent  syncope or presyncope.  In retrospect, it was fairly clear that his symptoms  were due to his nitroglycerin which in turn was used for what was probably  an exacerbation of his reflux.   CURRENT MEDICATIONS:  1. Altace 10 mg per day.  2. Lopressor 50 mg twice per day.  3. Enteric coated aspirin 81 mg per day.  4. Fish oil 1000 mg per day.  5. Vytorin 10/20 one per day.  6. Tricor 145 mg per day.  7. Zantac 250 mg per day.  8. Niacin over the counter 2 grams per day.   PHYSICAL EXAMINATION:  He is generally well-appearing and in no distress.  Heart rate 63, blood pressure 120/72 and weight of 173 pounds.  He is barrel chested.  He has no jugular venous distention, no thyromegaly.  LUNGS:  Are clear to auscultation.  He has a nondisplaced port of maximal cardiac impulse.  There is a regular  rate and rhythm without murmur.  Heart sounds are distant.  ABDOMEN:  Is soft,  nondistended, nontender.  There is no hepatosplenomegaly.  Bowel sounds normal.  There is no midline pulsatile mass.  EXTREMITIES:  Are warm without clubbing, cyanosis, edema, or ulceration.   Electrocardiogram demonstrates normal sinus rhythm with sinus arrhythmia and  is normal.   IMPRESSIONS, ASSESSMENT, RECOMMENDATIONS:  1. Coronary disease:  Asymptomatic with preserved left ventricular      systolic function.  All grafts are patent.  Continue secondary      prevention efforts including aspirin, ACE inhibitor, and beta blocker.      He does have prior myocardial infarction and will be continued on beta      blocker indefinitely.  2. Hypercholesterolemia:  Followed by our [facility].  Continue current      therapy.  3. Hypertension:  Nicely controlled.  4. Thrombocytopenia:  During hospitalization platelets dropped from      322,000 to 124,000.  Recheck a week later they were  up to 144,000.  HIT      panel was negative.   DISPOSITION:  Patient will follow up in 12 months time.     Salvadore Farber, MD  Electronically Signed    WED/MedQ  DD: 01/26/2006  DT: 01/27/2006  Job #: [id number]   cc:   Nadine Counts

## 2010-08-07 NOTE — Discharge Summary (Signed)
[name], STRATMANN NO.:  [id number]   MEDICAL RECORD NO.:  [id number]          PATIENT TYPE:  INP   LOCATION:  2035                         FACILITY:  [facility]   PHYSICIAN:  Salvadore Farber, MD  DATE OF BIRTH:  December 07, 1928   DATE OF ADMISSION:  12/23/2005  DATE OF DISCHARGE:  12/24/2005                           DISCHARGE SUMMARY - REFERRING   The patient does not have a primary care physician.   SUMMARY OF HISTORY:  Ms. Deutschman is a 77 year old white male who recently had  some brief episodes of chest discomfort that decreased spontaneously,  however, on the morning of admission he was being drive to [place] office for  blood work and he developed 8/10 substernal chest discomfort which he  described as a dull aching.  He took a sublingual nitroglycerin, became  diaphoretic, nauseous, short of breath with decreased vision.  There was  apparent loss of consciousness for approximately 2 minutes.  When he came  to, he had his blood drawn and he felt like he was drunk and he was sent  home and his wife called the office.  He was referred to the ER at which  time he was pain free.   PAST MEDICAL HISTORY:  1. Hypertension.  2. Hyperlipidemia.  3. GERD.  4. Peptic ulcer disease.  5. Postoperative atrial fibrillation in 1988.  6. Known coronary artery disease with 3-vessel bypass surgery in 1988 with      a LIMA to the LAD, saphenous vein graft to the OM, saphenous vein graft      to the RCA.  He has had PCIs in 1999.  Exercise Myoview in 2005, did      not show any signs of ischemia and an EF of 62%.  7. Remote tobacco use.   LABORATORY DATA:  Chest x-ray, on the 5th, shows mild hyperinflation.  No  acute airspace process or CHF, nipple shadows bilaterally, post-op bypass.  Admission weight was 171.9.  H&H 13.2 and 40.3, normal indices, platelets  322, WBC 19.7.  Subsequent hematology were unremarkable except for a WBC was  noted to be 4.5.  Platelets throughout his  admission were 124 and 127.  PTT  was 41, PT 14.1, INR 1.1.  Sodium 139, potassium 3.6, BUN 19, creatinine  1.7, glucose 218.  Normal LFTs.  CK-MB and troponin were within normal  limits x2.  Fasting lipids showed a total cholesterol 159, triglycerides  187, HDL 43,  LDL 79.  TSH was 0.363.  Heparin antibody screen was negative.  EKG showed sinus bradycardia, normal axis, nonspecific changes.   HOSPITAL COURSE:  Mr. Ricciuti was admitted to [facility] by  Nicolasa Ducking, ANP and Dr. Dietrich Pates.  It was felt that the syncope was  likely secondary to hypertension associated with nitroglycerin use.  It was  noted at the time of the discharge dictation, his dictated H&P is pending.  He underwent a cardiac catheterization on December 23, 2005, which showed  native 3-vessel coronary artery disease, LIMA to the LAD was patent as well  as the saphenous vein graft  to the OM-2.  The saphenous vein graft to the  RCA had a diffuse 50-60% in-stent restenosis.  EF was 50%.  Dr. Gala Romney  did not feel that there were any culprit lesions and reviewed with Dr.  Riley Kill.  It was recommended to continue medical treatment and a D-dimer was  checked.  D-dimer was 0.22.  Dr. Samule Ohm reviewed, felt that Mr. Herberger could  be discharged home.  A HIT panel was ordered and was negative.  Dr. Samule Ohm  requested a CBC to be checked in one week in regards to his  thrombocytopenia.  In regards to the possible claudication symptoms, Dr.  Samule Ohm felt that his pulses were excellent and his symptoms were atypical  for claudication.   DISCHARGE DIAGNOSES:  1. Syncope secondary to sublingual nitroglycerin.  2. Chest discomfort of uncertain etiology.  3. Thrombocytopenia.  4. Leg discomfort.   HISTORY:  As noted below.   PROCEDURES PERFORMED:  Cardiac catheterization on December 23, 2005, by Dr.  Gala Romney.   DISPOSITION:  Mr. Chaikin is discharged home.   He is asked to continue a low salt, fat, cholesterol diet.    ACTIVITY AND WOUND CARE:  Per supplemental discharge sheet.   He is asked to bring all medications to all appointments.   1. He received a new prescription for Diovan 320 mg every day and was      asked to discontinue Altace secondary to a history of a dry cough for 2      years.  2. He was asked to continue his other medications such as Niaspan 1,000      mg, two tabs q.h.s.  3. Lopressor 50 mg b.i.d.  4. Aspirin 81 mg every day.  5. Fish oil 8,000 mg every day.  6. Vytorin 10/20 mg at bedtime.  7. TriCor 145 mg every day.  8. Zantac 150 mg every day.  9. Allegra 180 mg every day.  10.Nitroglycerin as needed.  He was also instructed for nitroglycerin use,      not to use while driving, and if he needed to take nitroglycerin sit or      lay down before taking this medication and notify somebody prior to      taking.   He will follow up with Dr. Samule Ohm on January 26, 2006 at 4:15.  He will have  a CBC drawn on Thursday on January 30, 2006 at 10 a.m. to followup on  thrombocytopenia.   He is, again, asked not to take Altace secondary to dry cough.   DISCHARGE TIME:  Less than 30 minutes.     ______________________________  Joellyn Rued, PA-C      Salvadore Farber, MD  Electronically Signed    EW/MEDQ  D:  12/24/2005  T:  12/25/2005  Job:  [id number]   cc:   Salvadore Farber, MD

## 2010-08-07 NOTE — Discharge Summary (Signed)
NAMEINDIO, SANTILLI NO.:  [id number]   MEDICAL RECORD NO.:  [id number]          PATIENT TYPE:  INP   LOCATION:  1411                         FACILITY:  [facility]   PHYSICIAN:  Lindaann Slough, M.D.  DATE OF BIRTH:  August 21, 1928   DATE OF ADMISSION:  06/23/2007  DATE OF DISCHARGE:  06/24/2007                               DISCHARGE SUMMARY   DISCHARGE DIAGNOSES:  1. Urinary retention.  2. Benign prostatic hypertrophy.  3. Bladder calculi.  4. Coronary artery disease.  5. Hypertension.  6. Hyperlipidemia.   PROCEDURES PERFORMED:  Cystoscopy, electrohydraulic lithotripsy of  bladder stones and transurethral resection of the prostate on June 23, 2007.   The patient is a 77 year old male who went into urinary retention after  back surgery in January 2009.  He was started on Flomax but he has been  unable to void since.  Cystoscopy showed trilobar prostatic hypertrophy  and a bladder diverticulum.  Ultrasound showed a possible stone in the  diverticulum.  The patient was admitted on June 23, 2007, for TURP and  EHL of bladder stones.   PHYSICAL EXAMINATION:  Blood pressure was 137/86, pulse 79, respirations  20, temperature 96.9.  LUNGS:  Clear.  HEART:  Regular rhythm.  ABDOMEN:  Soft and nondistended.  No CVA tenderness.  Bladder not  distended.  He had a Foley catheter that was draining clear urine.  RECTAL:  Prostate was enlarged, 40 g, without any nodules.   Hemoglobin on admission was 13.5, hematocrit 38.8 and WBC 5.3.  Sodium  139, potassium 3.8, BUN 21, creatinine 1.3.  Urine culture was positive  for Serratia marcescens resistant to cefazolin and nitrofurantoin and  sensitive to all other antibiotics.  EKG showed sinus bradycardia.   The patient had cystoscopy, EHL of bladder stones and TURP on June 23, 2007.  Postop course was uneventful.  He remained afebrile.  He  tolerated his diet well.  The Foley catheter was removed on June 24, 2007.  After  removing the Foley, he  was voiding well.  His urine was  grossly clear.  He was then discharged home on June 24, 2007 on:   1. Metoprolol 50 mg twice a day.  2. Tricor 145 mg a day.  3. Ramipril  10 mg a day.  4. Vytorin  10/40 mg one a day.  5. Ranitidine  75 mg 2 tablets twice a day,.  6. Omega-e fish oil 1000 mg 8 tablets nightly.  7. Niacin 500 mg 4 tablets a day.  8. Cipro 500 mg twice a day.   CONDITION ON DISCHARGE:  Improved.   DISCHARGE DIET:  Regular.   The patient is instructed not to do any lifting, straining or driving  for about 6 weeks, and he will be followed in the office.      Lindaann Slough, M.D.  Electronically Signed     MN/MEDQ  D:  07/07/2007  T:  07/07/2007  Job:  [id number]

## 2010-08-07 NOTE — Assessment & Plan Note (Signed)
[facility]                               LIPID CLINIC NOTE   [name], [name]                        MRN:          [id number]  DATE:02/24/2006                            DOB:          October 26, 1928    REFERRING PHYSICIAN:  Luis Abed, MD, [facility]   Patient seen in the Lipid clinic for further evaluation, medication  titration associated with his documented coronary disease and multiple  coronary intervention, vascular interventions.  Patient has been feeling  and doing well overall.  He has had no muscle aches, pains or fatigue.   CURRENT MEDICATIONS:  1. Altace 10 mg daily.  2. Lopressor 50 mg twice daily.  3. Enteric-coated aspirin 81 mg daily.  4. Fish oil 8-9 gm daily.  5. Vytorin 10/20 one tablet daily.  6. Tricor 145 mg daily.  7. Zantac 150 mg daily.  8. Two gm over-the-counter Niacin daily.   REVIEW OF SYSTEMS:  As stated in the HPI, otherwise negative.   PHYSICAL EXAMINATION:  Blood pressure today is 110/62, heart rate is 64.  Respirations are 16.   Labs reveal normal LFTs and CK.  Total LDL 110.9.  Triglyceride 294.  HDL 36.5.   ASSESSMENT:  The patient has documented coronary artery disease.  LDL  goal less than 70 which he has met in the past.  He has backed off of  some of his medications.  Will have him resume his Vytorin as well as  his fish oil today in a regular fashion.  He will call with questions or  problems.  In the meantime, we will see him back in 6 weeks.  Patient  did have a recent trip to  the hospital, and I anticipate some of this may have been, and time off  of his medications may have played into his lack of LDL control.  We  will see him back in 6 weeks.      Shelby Dubin, PharmD, BCPS, CPP  Electronically Signed      Rollene Rotunda, MD, [facility]  Electronically Signed   MP/MedQ  DD: 03/11/2006  DT: 03/12/2006  Job #: [id number]   cc:   Salvadore Farber, MD

## 2010-08-13 ENCOUNTER — Telehealth: Payer: Self-pay | Admitting: Internal Medicine

## 2010-08-13 NOTE — Telephone Encounter (Signed)
Pt wife states they been trying to have pt meds refill since Monday. Pt needs altace to be call in to [facility] drug # [phone]

## 2010-08-14 ENCOUNTER — Other Ambulatory Visit: Payer: Self-pay | Admitting: *Deleted

## 2010-08-14 MED ORDER — RAMIPRIL 10 MG PO CAPS: 10.0000 mg | ORAL_CAPSULE | Freq: Every day | ORAL | Status: DC

## 2010-08-21 ENCOUNTER — Other Ambulatory Visit: Payer: Self-pay | Admitting: *Deleted

## 2010-10-12 ENCOUNTER — Other Ambulatory Visit: Payer: Self-pay | Admitting: Internal Medicine

## 2010-10-12 ENCOUNTER — Other Ambulatory Visit (INDEPENDENT_AMBULATORY_CARE_PROVIDER_SITE_OTHER): Payer: Medicare Other

## 2010-10-12 DIAGNOSIS — E7800 Pure hypercholesterolemia, unspecified: Secondary | ICD-10-CM

## 2010-10-12 DIAGNOSIS — Z79899 Other long term (current) drug therapy: Secondary | ICD-10-CM

## 2010-10-12 LAB — HEPATIC FUNCTION PANEL
AST: 15 U/L (ref 0–37)
Albumin: 4.2 g/dL (ref 3.5–5.2)
Alkaline Phosphatase: 55 U/L (ref 39–117)

## 2010-10-12 LAB — LDL CHOLESTEROL, DIRECT: Direct LDL: 57.4 mg/dL

## 2010-10-12 LAB — LIPID PANEL
Cholesterol: 131 mg/dL (ref 0–200)
Total CHOL/HDL Ratio: 3

## 2010-10-15 ENCOUNTER — Ambulatory Visit (INDEPENDENT_AMBULATORY_CARE_PROVIDER_SITE_OTHER): Payer: Medicare Other

## 2010-10-15 VITALS — BP 104/66 | Wt 175.0 lb

## 2010-10-15 DIAGNOSIS — E785 Hyperlipidemia, unspecified: Secondary | ICD-10-CM

## 2010-10-15 MED ORDER — FENOFIBRATE 160 MG PO TABS: 160.0000 mg | ORAL_TABLET | Freq: Every day | ORAL | Status: DC

## 2010-10-15 NOTE — Assessment & Plan Note (Addendum)
Lipid values are as follows:  TC 131 (goal <200), TG 368 (goal<150), HDL 41.4 (goal >40), LDL 57.4 (goal <70).  Patient is at goal for all values except for TG, which remains above goal and has even increased since last visit.  Patient's diet has changed very little, so we have reassessed the dietary goals.  His main dietary issues at this time are sweets including oatmeal cookies and lack of sufficient vegetables.  Patient has also decreased his exercise and we have discussed a new exercise goal.  We will not make any med changes except for a trial of lower dose niacin in an attempt to lessen muscle and leg pain.  I do not know if this pain is associated with arthritis or statin myalgias, but we will try a lower niacin dose for 2 weeks as a trial.    Plan: 1)  Start generic fenofibrate 160 mg (vs. Tricor 145 mg) 2)  Decrease niacin to 1 gram daily for two weeks in an attempt to reduce leg/muscle pain 3)  Increase fiber through increased fruits and vegetables, and limit starchy vegetables 4)  Limit oatmeal cookies and other sweets 5)  Attempt to ride stationary bike for 15 minutes 3 times weekly 6)  Return for follow-up in 3 months.   Note:  Pt given Vytorin 10/40 mg samples  #28

## 2010-10-15 NOTE — Patient Instructions (Signed)
1)  Continue current medications I will send in a prescription for the generic Tricor (Fenofibrate 160 mg) Decrease Niacin dose to 1 gram (2 tablets) daily to determine if muscle pain will improve.  If no improvement after 2 weeks, please increase dose back to 2 grams (4 tablets) per day. 2)  Continue making healthy dietary choices.   Increase high fiber fruits and vegetables and limit starchy vegetables Limit sweets (especially oatmeal cookies).  3)  Attempt to ride stationary bike 3 times per week for 15 minutes each time 4)  Return to lipid clinic in 3 months.   Lipid clinic visit:  Thursday October 25th @ 8:30 am Labwork:  Monday, October 22nd @ 7:30 am

## 2010-10-15 NOTE — Progress Notes (Signed)
[name] is a pleasant 77 yo gentlemen presenting to Lipid Clinic today for FU visit. He is accompanied by his wife.  Pt's current therapy includes Vytorin 10/40 mg daily, Tricor 145 mg daily, Fish Oil 8 gram daily, and Niacin 2 gram daily. He is tolerating this therapy fairly well, but does complain of some increased muscle pain.  He describes this pain as occuring in his legs and sometimes arms.  He reports that it does not feel like the typical arthritis pain he normally has.  We will adjust meds slightly in an attempt to resolve this.   He continues taking OTC NSAIDs for arthritis pain with some relief.    Patient continues eating a mainly healthy diet. For breakfast, he has [place] bacon, whole wheat bread, and egg beaters. For lunch and supper, he eats mostly chicken and [place].  He does eat fruits/vegetables, but not 5 servings daily.  At last visit patient was asked to increase high fiber fruits/vegetables and to limit starchy vegetables.  He has attempted to make these changes, but has not been very successful.  He also continues to eat oatmeal cookies and sometimes has this as his lunch.  His exercise is limited by a back surgery he had last year.   He reports that he has not used the exercise bike lately.  I have encouraged him to attempt to resume riding the bike at least a few times per week.    Current Outpatient Prescriptions  Medication Sig Dispense Refill  . aspirin 81 MG tablet Take 81 mg by mouth daily.        . fenofibrate 160 MG tablet Take 1 tablet (160 mg total) by mouth daily.  30 tablet  6  . fish oil-omega-3 fatty acids 1000 MG capsule Take 8 g by mouth daily.        . metoprolol (LOPRESSOR) 50 MG tablet Take 50 mg by mouth 2 (two) times daily.       . niacin 500 MG tablet Take 2,000 mg by mouth daily.        . psyllium (METAMUCIL) 58.6 % powder Take 1 packet by mouth daily as needed.        . ramipril (ALTACE) 10 MG capsule Take 1 capsule (10 mg total) by mouth daily.  30  capsule  6  . ranitidine (ZANTAC) 150 MG tablet Take 150 mg by mouth 2 (two) times daily.        Marland Kitchen VYTORIN 10-40 MG per tablet Take 1 tablet by mouth at bedtime.         Allergies  Allergen Reactions  . Penicillins     REACTION: hives

## 2010-10-16 ENCOUNTER — Other Ambulatory Visit: Payer: Self-pay | Admitting: *Deleted

## 2010-10-16 MED ORDER — METOPROLOL TARTRATE 50 MG PO TABS: 50.0000 mg | ORAL_TABLET | Freq: Two times a day (BID) | ORAL | Status: DC

## 2010-12-09 LAB — BASIC METABOLIC PANEL
BUN: 17
Calcium: 9.6
Creatinine, Ser: 1.13
GFR calc Af Amer: >60
GFR calc non Af Amer: >60

## 2010-12-09 LAB — URINALYSIS, ROUTINE W REFLEX MICROSCOPIC
Glucose, UA: NEGATIVE
Ketones, ur: NEGATIVE
Nitrite: NEGATIVE
pH: 7

## 2010-12-09 LAB — URINE MICROSCOPIC-ADD ON

## 2010-12-09 LAB — CBC
MCV: 93.4
Platelets: 158
RBC: 4.4
WBC: 6.3

## 2010-12-15 LAB — CBC
HCT: 30.8 — ABNORMAL LOW
Hemoglobin: 11.1 — ABNORMAL LOW
MCV: 89.5
Platelets: 214
RBC: 3.44 — ABNORMAL LOW
WBC: 5.3
WBC: 9.8

## 2010-12-15 LAB — APTT: aPTT: 27

## 2010-12-15 LAB — COMPREHENSIVE METABOLIC PANEL
ALT: 15
AST: 18
Albumin: 3.8
Chloride: 106
Creatinine, Ser: 1.34
GFR calc Af Amer: >60
Potassium: 3.8
Sodium: 139
Total Bilirubin: 0.8

## 2010-12-15 LAB — URINALYSIS, ROUTINE W REFLEX MICROSCOPIC
Bilirubin Urine: NEGATIVE
Nitrite: POSITIVE — AB
Specific Gravity, Urine: 1.021
pH: 7

## 2010-12-15 LAB — URINE CULTURE: Special Requests: NEGATIVE

## 2010-12-15 LAB — BASIC METABOLIC PANEL
Chloride: 102
GFR calc Af Amer: >60
Potassium: 3.7

## 2010-12-15 LAB — URINE MICROSCOPIC-ADD ON

## 2011-01-11 ENCOUNTER — Ambulatory Visit: Payer: Medicare Other

## 2011-01-11 DIAGNOSIS — Z79899 Other long term (current) drug therapy: Secondary | ICD-10-CM

## 2011-01-11 DIAGNOSIS — E7800 Pure hypercholesterolemia, unspecified: Secondary | ICD-10-CM

## 2011-01-11 LAB — HEPATIC FUNCTION PANEL
Alkaline Phosphatase: 50 U/L (ref 39–117)
Bilirubin, Direct: 0.1 mg/dL (ref 0.0–0.3)

## 2011-01-11 LAB — LIPID PANEL
Total CHOL/HDL Ratio: 5
VLDL: 50.4 mg/dL — ABNORMAL HIGH (ref 0.0–40.0)

## 2011-01-14 ENCOUNTER — Ambulatory Visit (INDEPENDENT_AMBULATORY_CARE_PROVIDER_SITE_OTHER): Payer: Medicare Other

## 2011-01-14 VITALS — BP 128/76 | HR 72

## 2011-01-14 DIAGNOSIS — E785 Hyperlipidemia, unspecified: Secondary | ICD-10-CM

## 2011-01-14 NOTE — Assessment & Plan Note (Addendum)
Assessment: TG have come down since last visit, while LDL has increased, most likely secondary to sustained reduction in niacin dose and poor dietary choices. Labs from 10/22: TC 228 (goal <200), TG 252 (goal <150), HDL 42 (goal >40), LDL 131 (goal <70). LFTs WNL.  Plan: As patient states that pain has not changed since last visit, will increase niacin back to 2 gm daily as this is most likely not the source of the pain. Instructed patient on diet/exercise modifications and patient left office with instructions below. Gave samples of Vytorin 10/40 mg. Will follow up with lipid clinic in 3 months.  1) Increase niacin to 2 grams daily. 2) Continue all other medications. 3) Decrease fried chicken to 1 box per month. 4) Increase intake of water and fiber in your diet. 5) Use the exercise bike 10 minutes on 3 days of the week.

## 2011-01-14 NOTE — Progress Notes (Signed)
[name] is a 77 year old white male presenting to the lipid clinic for 3 month follow up of his dyslipidemia. Current cholesterol medications include fenofibrate 160 mg daily, Vytorin 10/40 mg daily, fish oil 8 gm daily, and niacin 1 gm daily. Patient was directed to take niacin 1 gm daily for 2 weeks at last visit, secondary to muscle pains, however, he has continued taking 1 gm for the entire 2 months. Past medication intolerances include Lipitor and Crestor. Patient reports that he is compliant with his medications and tolerates them well. He does acknowledge having some shoulder and leg pain, although upon description, this appears to be more arthritic in nature.  Diet: Since last visit, patient has had some dietary changes. Breakfast now consists of a bowl of Raisin Bran with skim milk and a cup of black coffee. For lunch patient will have chicken, normally baked or broiled, with a piece of bread and a diet soda or unsweet tea. He does report eating 1-2 boxes of fried chicken every Friday for about 2 months. At dinner, patient eats green beans, salad, [place], chick, fish, or sometimes a lean steak. These are all prepared baked. If he is alone at dinner, he will make a sandwich with deli meat, tomato, and cheese. He eats one plate of food at meal times, and has also decreased his oatmeal cookie intake to 1 cookie most days of the week (previously at least 3 cookies per day). He drinks 3-4 glasses of water per day.  Exercise: [name] has not been getting any exercise. He reports having had bronchitis about a month ago, and didn't feel he was able to get any physical activity. He thinks that he would be able to now, and is amenable to restarting.  Social History: [name] quit smoking in 1967 and does not drink alcohol.  Current Outpatient Prescriptions on File Prior to Visit  Medication Sig Dispense Refill  . aspirin 81 MG tablet Take 81 mg by mouth daily.        . fenofibrate 160 MG tablet Take 1  tablet (160 mg total) by mouth daily.  30 tablet  6  . fish oil-omega-3 fatty acids 1000 MG capsule Take 8 g by mouth daily.        . metoprolol (LOPRESSOR) 50 MG tablet Take 1 tablet (50 mg total) by mouth 2 (two) times daily.  60 tablet  6  . niacin 500 MG tablet Take 2,000 mg by mouth daily.        . psyllium (METAMUCIL) 58.6 % powder Take 1 packet by mouth daily as needed.        . ramipril (ALTACE) 10 MG capsule Take 1 capsule (10 mg total) by mouth daily.  30 capsule  6  . ranitidine (ZANTAC) 150 MG tablet Take 150 mg by mouth 2 (two) times daily.        Marland Kitchen VYTORIN 10-40 MG per tablet Take 1 tablet by mouth at bedtime.

## 2011-01-14 NOTE — Patient Instructions (Signed)
1) Increase niacin to 2 grams daily. 2) Continue all other medications. 3) Decrease fried chicken to 1 box per month. 4) Increase intake of water and fiber in your diet. 5) Use the exercise bike 10 minutes on 3 days of the week.  Follow up in 3 months at the lipid clinic: Thursday, January 24th at 8:30AM FASTING labs at [facility]: Monday, January 21st at 7:40AM

## 2011-04-02 ENCOUNTER — Other Ambulatory Visit: Payer: Self-pay

## 2011-04-02 ENCOUNTER — Telehealth: Payer: Self-pay | Admitting: Cardiology

## 2011-04-02 MED ORDER — EZETIMIBE-SIMVASTATIN 10-40 MG PO TABS: 1.0000 | ORAL_TABLET | Freq: Every day | ORAL | Status: DC

## 2011-04-02 NOTE — Telephone Encounter (Signed)
Pt's wife requesting refill of vytorin to [facility], said pharmacy requested it three times, pt almost out

## 2011-04-05 MED ORDER — EZETIMIBE-SIMVASTATIN 10-40 MG PO TABS: 1.0000 | ORAL_TABLET | Freq: Every day | ORAL | Status: DC

## 2011-04-12 ENCOUNTER — Other Ambulatory Visit (INDEPENDENT_AMBULATORY_CARE_PROVIDER_SITE_OTHER): Payer: Medicare Other

## 2011-04-12 ENCOUNTER — Encounter: Payer: Self-pay | Admitting: *Deleted

## 2011-04-12 DIAGNOSIS — E785 Hyperlipidemia, unspecified: Secondary | ICD-10-CM | POA: Diagnosis not present

## 2011-04-12 LAB — LDL CHOLESTEROL, DIRECT: Direct LDL: 61 mg/dL

## 2011-04-12 LAB — LIPID PANEL
HDL: 39.7 mg/dL (ref >39.00)
Total CHOL/HDL Ratio: 4
Triglycerides: 431 mg/dL — ABNORMAL HIGH (ref 0.0–149.0)

## 2011-04-12 LAB — HEPATIC FUNCTION PANEL
ALT: 21 U/L (ref 0–53)
Albumin: 4 g/dL (ref 3.5–5.2)
Total Bilirubin: 1.3 mg/dL — ABNORMAL HIGH (ref 0.3–1.2)

## 2011-04-15 ENCOUNTER — Ambulatory Visit (INDEPENDENT_AMBULATORY_CARE_PROVIDER_SITE_OTHER): Payer: Medicare Other | Admitting: Cardiology

## 2011-04-15 ENCOUNTER — Encounter: Payer: Self-pay | Admitting: Cardiology

## 2011-04-15 ENCOUNTER — Ambulatory Visit (INDEPENDENT_AMBULATORY_CARE_PROVIDER_SITE_OTHER): Payer: Medicare Other | Admitting: Pharmacist

## 2011-04-15 VITALS — BP 109/72 | HR 65 | Ht 71.0 in | Wt 178.0 lb

## 2011-04-15 VITALS — Wt 178.5 lb

## 2011-04-15 DIAGNOSIS — I6529 Occlusion and stenosis of unspecified carotid artery: Secondary | ICD-10-CM | POA: Diagnosis not present

## 2011-04-15 DIAGNOSIS — I10 Essential (primary) hypertension: Secondary | ICD-10-CM | POA: Diagnosis not present

## 2011-04-15 DIAGNOSIS — I2510 Atherosclerotic heart disease of native coronary artery without angina pectoris: Secondary | ICD-10-CM | POA: Diagnosis not present

## 2011-04-15 DIAGNOSIS — E785 Hyperlipidemia, unspecified: Secondary | ICD-10-CM

## 2011-04-15 DIAGNOSIS — R0989 Other specified symptoms and signs involving the circulatory and respiratory systems: Secondary | ICD-10-CM

## 2011-04-15 MED ORDER — EZETIMIBE-SIMVASTATIN 10-40 MG PO TABS: 1.0000 | ORAL_TABLET | Freq: Every day | ORAL | Status: DC

## 2011-04-15 MED ORDER — FENOFIBRATE 160 MG PO TABS: 160.0000 mg | ORAL_TABLET | Freq: Every day | ORAL | Status: DC

## 2011-04-15 NOTE — Assessment & Plan Note (Signed)
Continue present medications. Followed in the lipid clinic. 

## 2011-04-15 NOTE — Patient Instructions (Addendum)
Thank you for coming into lipid clinic this morning.   Continue all medications as you are taking them now.  Please continue working on your diet and limiting fried foods, starchy foods (pasta, breads), and oatmeal cookies.  Incorporate fiber and more water in your diet.  Try to use your exercise bike or walk for 15 minutes per day for 3 days per week.  Recheck in 3-4 months.  We will call you to make an appointment.

## 2011-04-15 NOTE — Patient Instructions (Signed)
Your physician wants you to follow-up in: ONE YEAR You will receive a reminder letter in the mail two months in advance. If you don't receive a letter, please call our office to schedule the follow-up appointment.   Your physician has requested that you have a lexiscan myoview. For further information please visit [email or web address]. Please follow instruction sheet, as given.   

## 2011-04-15 NOTE — Assessment & Plan Note (Signed)
Blood pressure controlled. Continue present medications. 

## 2011-04-15 NOTE — Progress Notes (Signed)
HPI:  [name] is a 89 yoWM presenting for dyslipidemia follow up.  He is currently treated with fenofibrate 160 mg daily, Vytorin 10/40 mg daily, fish oil 8 g daily, and niacin 2 g daily.  He does complain of some muscle/leg pains, but attributes them more to arthritis in the knees.  He has not able to exercise much, however he claims to walk or use his exercise bike around 1 time per week if he can.  Breakfast consists of raisin bran with skim milk and black coffee.  Lunch consists of a sandwich ([place] or chicken) or a meat/vegetables left over from the night before.  He also eats 1-2 oatmeal cream cookies per day with lunch.  For dinner, he eats chicken or fish (baked) and vegetables.  He drinks unsweet tea with splenda, diet cola occasionally, and water.  He quit smoking tobacco in 1963 and does not drink alcohol.  Reviewed current medication list.  Current Outpatient Prescriptions  Medication Sig Dispense Refill  . albuterol (PROVENTIL) (2.5 MG/3ML) 0.083% nebulizer solution Take 2.5 mg by nebulization every 6 (six) hours as needed.        Marland Kitchen aspirin 81 MG tablet Take 81 mg by mouth daily.        . beclomethasone (QVAR) 80 MCG/ACT inhaler Inhale 2 puffs into the lungs as needed.        . ezetimibe-simvastatin (VYTORIN) 10-40 MG per tablet Take 1 tablet by mouth at bedtime.  90 tablet  3  . fenofibrate 160 MG tablet Take 1 tablet (160 mg total) by mouth daily.  30 tablet  6  . fish oil-omega-3 fatty acids 1000 MG capsule Take 8 g by mouth daily.        Marland Kitchen ipratropium (ATROVENT) 0.02 % nebulizer solution Take 500 mcg by nebulization as needed.       . metoprolol (LOPRESSOR) 50 MG tablet Take 1 tablet (50 mg total) by mouth 2 (two) times daily.  60 tablet  6  . niacin 500 MG tablet Take 2,000 mg by mouth daily.        . psyllium (METAMUCIL) 58.6 % powder Take 1 packet by mouth daily as needed.        . ramipril (ALTACE) 10 MG capsule Take 1 capsule (10 mg total) by mouth daily.  30 capsule  6  . ranitidine  (ZANTAC) 150 MG tablet Take 150 mg by mouth 2 (two) times daily.         Allergies  Allergen Reactions  . Penicillins     REACTION: hives

## 2011-04-15 NOTE — Assessment & Plan Note (Signed)
Continue aspirin and statin. Followup carotid Dopplers November 2013. 

## 2011-04-15 NOTE — Assessment & Plan Note (Addendum)
Current lipid panel (04/12/11):  TC 164 (goal <200), TG 431 (goal<150), HDL 40 (goal>40), LDL 61 (OZHY<86).  LFTs are WNL.  LDL has improved since last visit and remains within goal.  HDL has decreased slightly but is at goal.  TG have drastically increased into the 400s from the 200s (01/11/11), with little explanation.  His diet seems fairly consistently healthy with few areas for improvement.  We discussed small ways to cut out fat through reduction in the oatmeal cookies and fried chicken.  His BG (>2 hrs postprandial) was 82, ruling out a possible cause of the increase in TG.  Plan: 1)  Continue all medications as taking now. 2)  Continue to make healthy dietary choices and limit fried foods and oatmeal cookies 3)  Try to incorporate more fiber and water into your diet 4)  As able, use the exercise bike or walk 2-3 times per week for 10 minutes. 5)  Recheck lipid panel in 3-4 months.

## 2011-04-15 NOTE — Assessment & Plan Note (Signed)
Continue aspirin and statin. Schedule Myoview for risk stratification. 

## 2011-04-15 NOTE — Progress Notes (Signed)
HPI:  [name] is a very pleasant male previously followed by Dr Gala Romney with history of coronary artery disease for fu.  He is status post bypass grafting in 1988 and 1999.  He had an inferior infarct and underwent stenting of the vein graft to the right coronary artery.  Ejection fraction was 62%.  He had an episode of presyncope and underwent cardiac catheterization in 2007.  This showed patent grafts with an EF of 50%.  He also has a history of hypertension and hyperlipidemia (followed in lipid clinic). Carotid Dopplers in November of 2011 showed 0-39% stenosis bilaterally and followup recommended in 2 years. ABIs in November of 2011 were normal. Patient last seen in October of 2011. Since then, the patient denies any dyspnea on exertion, orthopnea, PND, pedal edema, palpitations, syncope or chest pain.     Current Outpatient Prescriptions  Medication Sig Dispense Refill  . albuterol (PROVENTIL) (2.5 MG/3ML) 0.083% nebulizer solution Take 2.5 mg by nebulization every 6 (six) hours as needed.        Marland Kitchen aspirin 81 MG tablet Take 81 mg by mouth daily.        . beclomethasone (QVAR) 80 MCG/ACT inhaler Inhale 2 puffs into the lungs as needed.        . ezetimibe-simvastatin (VYTORIN) 10-40 MG per tablet Take 1 tablet by mouth at bedtime.  90 tablet  3  . fenofibrate 160 MG tablet Take 1 tablet (160 mg total) by mouth daily.  30 tablet  6  . fish oil-omega-3 fatty acids 1000 MG capsule Take 8 g by mouth daily.        Marland Kitchen ipratropium (ATROVENT) 0.02 % nebulizer solution Take 500 mcg by nebulization as needed.       . metoprolol (LOPRESSOR) 50 MG tablet Take 1 tablet (50 mg total) by mouth 2 (two) times daily.  60 tablet  6  . niacin 500 MG tablet Take 2,000 mg by mouth daily.        . psyllium (METAMUCIL) 58.6 % powder Take 1 packet by mouth daily as needed.        . ramipril (ALTACE) 10 MG capsule Take 1 capsule (10 mg total) by mouth daily.  30 capsule  6  . ranitidine (ZANTAC) 150 MG tablet Take  150 mg by mouth 2 (two) times daily.           Past Medical History  Diagnosis Date  . Coronary artery disease     post CABG x3in 1988 with left nternal mammary artery to left anterior descending, vein graft to the obtuse marginal, vein graft to the right coronary artery  . Inferior myocardial infarction     Status post inferior myocardial infarction in 1988  . Hypertension   . Hyperlipidemia   . Hypertriglyceridemia   . Gastroesophageal reflux disease with ulceration   . Peptic ulcer disease   . Postoperative atrial fibrillation     history of   . Back pain   . Leg weakness     Past Surgical History  Procedure Date  . Coronary artery bypass graft 1988    x3in 1988 with left nternal mammary artery to left anterior descending, vein graft to the obtuse marginal, vein graft to the right coronary artery  . Coronary angioplasty with stent placement 11/1997    status post percutaneous coronary intervention and stenting with bare metal stent of the vein graft to the right coronary artery.  . Cardiac catheterization 06/2005     EF  50%. severe native CAD. SVG-RCA 50-60 othewise all grafts patent    History   Social History  . Marital Status: Married    Spouse Name: N/A    Number of Children: N/A  . Years of Education: N/A   Occupational History  . Not on file.   Social History Main Topics  . Smoking status: Former Smoker    Quit date: 10/14/1961  . Smokeless tobacco: Not on file   Comment: Quit in 1963  . Alcohol Use: No  . Drug Use: Not on file  . Sexually Active: Not on file   Other Topics Concern  . Not on file   Social History Narrative  . No narrative on file    ROS: Problems with back pain and arthralgias but no fevers or chills, productive cough, hemoptysis, dysphasia, odynophagia, melena, hematochezia, dysuria, hematuria, rash, seizure activity, orthopnea, PND, pedal edema, claudication. Remaining systems are negative.  Physical Exam: Well-developed  well-nourished in no acute distress.  Skin is warm and dry.  HEENT is normal.  Neck is supple. No thyromegaly.  Chest diminished breath sounds and increased AP diameter.  Cardiovascular exam is regular rate and rhythm.  Abdominal exam nontender or distended. No masses palpated. Extremities show no edema. neuro grossly intact  ECG sinus rhythm at a rate of 65. First degree AV block. Prior inferior infarct. Nonspecific ST changes.

## 2011-04-22 ENCOUNTER — Ambulatory Visit (HOSPITAL_COMMUNITY): Payer: Medicare Other | Attending: Cardiovascular Disease | Admitting: Radiology

## 2011-04-22 VITALS — BP 106/60 | Ht 71.0 in | Wt 180.0 lb

## 2011-04-22 DIAGNOSIS — I779 Disorder of arteries and arterioles, unspecified: Secondary | ICD-10-CM | POA: Diagnosis not present

## 2011-04-22 DIAGNOSIS — E785 Hyperlipidemia, unspecified: Secondary | ICD-10-CM | POA: Diagnosis not present

## 2011-04-22 DIAGNOSIS — Z87891 Personal history of nicotine dependence: Secondary | ICD-10-CM | POA: Insufficient documentation

## 2011-04-22 DIAGNOSIS — I2510 Atherosclerotic heart disease of native coronary artery without angina pectoris: Secondary | ICD-10-CM | POA: Diagnosis not present

## 2011-04-22 DIAGNOSIS — I4891 Unspecified atrial fibrillation: Secondary | ICD-10-CM | POA: Insufficient documentation

## 2011-04-22 DIAGNOSIS — R5381 Other malaise: Secondary | ICD-10-CM | POA: Diagnosis not present

## 2011-04-22 DIAGNOSIS — J4489 Other specified chronic obstructive pulmonary disease: Secondary | ICD-10-CM | POA: Insufficient documentation

## 2011-04-22 DIAGNOSIS — I10 Essential (primary) hypertension: Secondary | ICD-10-CM | POA: Diagnosis not present

## 2011-04-22 DIAGNOSIS — Z8249 Family history of ischemic heart disease and other diseases of the circulatory system: Secondary | ICD-10-CM | POA: Diagnosis not present

## 2011-04-22 DIAGNOSIS — J449 Chronic obstructive pulmonary disease, unspecified: Secondary | ICD-10-CM | POA: Insufficient documentation

## 2011-04-22 DIAGNOSIS — I739 Peripheral vascular disease, unspecified: Secondary | ICD-10-CM | POA: Diagnosis not present

## 2011-04-22 MED ORDER — TECHNETIUM TC 99M TETROFOSMIN IV KIT
33.0000 | PACK | Freq: Once | INTRAVENOUS | Status: AC | PRN
  Administered 2011-04-22: 33 via INTRAVENOUS

## 2011-04-22 MED ORDER — TECHNETIUM TC 99M TETROFOSMIN IV KIT
11.0000 | PACK | Freq: Once | INTRAVENOUS | Status: AC | PRN
  Administered 2011-04-22: 11 via INTRAVENOUS

## 2011-04-22 MED ORDER — REGADENOSON 0.4 MG/5ML IV SOLN
0.4000 mg | Freq: Once | INTRAVENOUS | Status: AC
  Administered 2011-04-22: 0.4 mg via INTRAVENOUS

## 2011-04-22 NOTE — Progress Notes (Signed)
[facility] SITE 3 NUCLEAR MED [address] [place] [address] [phone]  Cardiology Nuclear Med Study  AJAHNI NAY is a 78 y.o. male [id number] 1928-05-23   Nuclear Med Background Indication for Stress Test:  Evaluation for Ischemia and Stent/Graft Patency History:  Post Op Atrial Fibrillation; COPD; '88 IWMI>CABG; '99 PTCA/Stent-SVG>RCA; '05 VZD:GLOVF infero-basal infarct, no ischemia, EF=62%; '07 Cath:Patent grafts, except in-stent SVG>RCA, EF=50%, continue medical tx. Cardiac Risk Factors: Carotid Disease, Claudication, Family History - CAD, History of Smoking, Hypertension and Lipids  Symptoms:  Fatigue   Nuclear Pre-Procedure Caffeine/Decaff Intake:  None NPO After: 5:30pm yesterday   Lungs:  Clear.  O2 Sat 94% on RA IV 0.9% NS with Angio Cath:  20g  IV Site: R Antecubital x 1, tolerated well IV Started by:  Irean Hong, RN  Chest Size (in):  44 Cup Size: n/a  Height: 5\' 11"  (1.803 m)  Weight:  180 lb (81.647 kg)  BMI:  Body mass index is 25.10 kg/(m^2). Tech Comments:  Metoprolol not held this am, per patient.    Nuclear Med Study 1 or 2 day study: 1 day  Stress Test Type:  Eugenie Birks  Reading MD: Charlton Haws, MD  Order Authorizing Provider:  Olga Millers, MD  Resting Radionuclide: Technetium 71m Tetrofosmin  Resting Radionuclide Dose: 11.0 mCi   Stress Radionuclide:  Technetium 78m Tetrofosmin  Stress Radionuclide Dose: 33.0 mCi           Stress Protocol Rest HR: 67 Stress HR: 82  Rest BP: 106/60 Stress BP: 102/64  Exercise Time (min): n/a METS: n/a   Predicted Max HR: 140 bpm % Max HR: 58.57 bpm Rate Pressure Product: 8364   Dose of Adenosine (mg):  n/a Dose of Lexiscan: 0.4 mg  Dose of Atropine (mg): n/a Dose of Dobutamine: n/a mcg/kg/min (at max HR)  Stress Test Technologist: Smiley Houseman, CMA-N  Nuclear Technologist:  Domenic Polite, CNMT     Rest Procedure:  Myocardial perfusion imaging was performed at rest 45 minutes  following the intravenous administration of Technetium 25m Tetrofosmin.  Rest ECG: Nonspecific T-wave changes, 1st degree AVB, occasional PVC's/PAC's  Stress Procedure:  The patient received IV Lexiscan 0.4 mg over 15-seconds.  Technetium 42m Tetrofosmin injected at 30-seconds.  There were no diagnostic changes with Lexiscan, rare PVC and a hypotensive response.  Quantitative spect images were obtained after a 45 minute delay.  Stress ECG: No significant change from baseline ECG  QPS Raw Data Images:  Normal; no motion artifact; normal heart/lung ratio. Stress Images:  Normal homogeneous uptake in all areas of the myocardium. Rest Images:  Normal homogeneous uptake in all areas of the myocardium. Subtraction (SDS):  Normal Transient Ischemic Dilatation (Normal <1.22):  1.38 Lung/Heart Ratio (Normal <0.45):  0.36  Quantitative Gated Spect Images QGS EDV:  57 ml QGS ESV:  20 ml QGS cine images:  NL LV Function; NL Wall Motion QGS EF: 65%  Impression Exercise Capacity:  Lexiscan with no exercise. BP Response:  Normal blood pressure response. Clinical Symptoms:  No chest pain. ECG Impression:  No significant ST segment change suggestive of ischemia. Comparison with Prior Nuclear Study: No images to compare  Overall Impression:  Normal stress nuclear study.   Charlton Haws

## 2011-05-24 ENCOUNTER — Other Ambulatory Visit: Payer: Self-pay | Admitting: *Deleted

## 2011-05-24 MED ORDER — METOPROLOL TARTRATE 50 MG PO TABS: 50.0000 mg | ORAL_TABLET | Freq: Two times a day (BID) | ORAL | Status: DC

## 2011-05-28 DIAGNOSIS — E785 Hyperlipidemia, unspecified: Secondary | ICD-10-CM | POA: Diagnosis not present

## 2011-05-28 DIAGNOSIS — J4489 Other specified chronic obstructive pulmonary disease: Secondary | ICD-10-CM | POA: Diagnosis not present

## 2011-05-28 DIAGNOSIS — I10 Essential (primary) hypertension: Secondary | ICD-10-CM | POA: Diagnosis not present

## 2011-05-28 DIAGNOSIS — J449 Chronic obstructive pulmonary disease, unspecified: Secondary | ICD-10-CM | POA: Diagnosis not present

## 2011-06-04 DIAGNOSIS — N390 Urinary tract infection, site not specified: Secondary | ICD-10-CM | POA: Diagnosis not present

## 2011-08-02 ENCOUNTER — Encounter: Payer: Self-pay | Admitting: Cardiology

## 2011-08-02 DIAGNOSIS — Z79899 Other long term (current) drug therapy: Secondary | ICD-10-CM | POA: Diagnosis not present

## 2011-08-02 DIAGNOSIS — E785 Hyperlipidemia, unspecified: Secondary | ICD-10-CM | POA: Diagnosis not present

## 2011-08-10 ENCOUNTER — Ambulatory Visit (INDEPENDENT_AMBULATORY_CARE_PROVIDER_SITE_OTHER): Payer: Medicare Other | Admitting: Pharmacist

## 2011-08-10 DIAGNOSIS — E785 Hyperlipidemia, unspecified: Secondary | ICD-10-CM | POA: Diagnosis not present

## 2011-08-10 NOTE — Progress Notes (Signed)
[name] is a 89 yoM presenting for lipid follow-up in good spirits. Current cholesterol regimen: Ezetimibe/Simvasatin 10/40 mg daily, fenofibrate 160 mg daily, fish oil 8 grams daily, Niacin 2,000 mg. Denies any issues with any of the medications and denies non-compliance. Labs from Dr. Lamar Sprinkles office (08/03/11): TChol 189, Trig 296, HDL 32, LDL calc 98, AST 15, ALT 16.  Diet: Has made efforts to decrease fried foods and oatmeal cookies. Before he reports eating 1-2 boxes of fried chicken per week, now down to 1-2 boxes per month; also was eating 1-2 oatmeal cookies per day, now down to 1 cookie per day or less.  Breakfast: raisin bran w/skim milk and decaffeinated coffee. Lunch: low fat chicken/turkey sandwich. Dinner: sometimes Lobbyist fry, [place] burger, green beans, occasional salad, more brown rice (compared to white rice)  Exercise: Arthritis of the knee and some back pain is a limitation. Reports using an exercise bike about 4-5 times in the last 2 weeks (for ~15 min/session) . Was previously doing longer sessions but due to feeling worn out/pain, Dr. Marina Goodell suggested doing shorter sessions more frequently.

## 2011-08-10 NOTE — Patient Instructions (Addendum)
Keep up the good work with trying to limit the number of oatmeal cookies and fried foods. Try to keep these to a minimum  Continue taking all your medications as you are taking them now.  Continue to try to incorporate whole grains into your diet.  Try to keep up with exercising on the bike (about 15 minutes/day most days of the week) but don't over do it.  Follow-up office visit 12/07/11 at 9:00am. Please get fasting blood work a the week before and have them fax it to our office (fax number: [phone])

## 2011-08-10 NOTE — Assessment & Plan Note (Addendum)
Assessment/Plan: 83yoM with dyslipidemia--Most recent lipid panel (08/03/11): TChol 189 (Goal <200), Trig 296 (goal <150), HDL 32 (goal >40), LDL calc 98 (goal <70), AST 15, ALT 16. Triglycerides improved but LDL/HDL slightly worsened since January (Trig 431, LDL 61, HDL 40 on 04/12/11). Patient currently on Ezetimibe, Simvastatin, high dose fish oil, niacin, and metamucil. Will continue to work on diet and exercise and consider other medication options at follow-up in 4 months with fasting blood work.  Pt will have blood work done at his PCP.

## 2011-09-03 DIAGNOSIS — J449 Chronic obstructive pulmonary disease, unspecified: Secondary | ICD-10-CM | POA: Diagnosis not present

## 2011-09-03 DIAGNOSIS — I10 Essential (primary) hypertension: Secondary | ICD-10-CM | POA: Diagnosis not present

## 2011-09-03 DIAGNOSIS — E785 Hyperlipidemia, unspecified: Secondary | ICD-10-CM | POA: Diagnosis not present

## 2011-11-30 ENCOUNTER — Encounter: Payer: Self-pay | Admitting: Cardiology

## 2011-11-30 DIAGNOSIS — I10 Essential (primary) hypertension: Secondary | ICD-10-CM | POA: Diagnosis not present

## 2011-11-30 DIAGNOSIS — E785 Hyperlipidemia, unspecified: Secondary | ICD-10-CM | POA: Diagnosis not present

## 2011-11-30 DIAGNOSIS — Z79899 Other long term (current) drug therapy: Secondary | ICD-10-CM | POA: Diagnosis not present

## 2011-12-03 DIAGNOSIS — I10 Essential (primary) hypertension: Secondary | ICD-10-CM | POA: Diagnosis not present

## 2011-12-03 DIAGNOSIS — E785 Hyperlipidemia, unspecified: Secondary | ICD-10-CM | POA: Diagnosis not present

## 2011-12-03 DIAGNOSIS — J449 Chronic obstructive pulmonary disease, unspecified: Secondary | ICD-10-CM | POA: Diagnosis not present

## 2011-12-07 ENCOUNTER — Ambulatory Visit (INDEPENDENT_AMBULATORY_CARE_PROVIDER_SITE_OTHER): Payer: Medicare Other | Admitting: Pharmacist

## 2011-12-07 ENCOUNTER — Encounter: Payer: Self-pay | Admitting: Pharmacist

## 2011-12-07 VITALS — Wt 177.0 lb

## 2011-12-07 DIAGNOSIS — E785 Hyperlipidemia, unspecified: Secondary | ICD-10-CM | POA: Diagnosis not present

## 2011-12-07 NOTE — Patient Instructions (Addendum)
It was nice to see you today  Your cholesterol looked good today and has been for the last few visits, it was a pleasure working with you but since you look so good we will have you continue to follow up with your primary doctor.

## 2011-12-07 NOTE — Progress Notes (Signed)
[name] is a 38 yoM presenting for lipid follow-upwith his wife. Current cholesterol regimen: Ezetimibe/Simvasatin 10/40 mg daily, fenofibrate 160 mg daily, fish oil 8 grams daily, Niacin 2,000 mg. Denies any issues with any of the medications and endorses good adherence.   Labs from Dr. Lamar Sprinkles office (12/01/11): TChol 182, Trig 324, HDL 32, LDL calc 85, AST 20, ALT 16.  Diet: Has continued succesful efforts to decrease fried foods and oatmeal cookies. Average diet remains mostly unchanged with an average meal breakdown as follows:  Breakfast: raisin bran w/skim milk and decaffeinated coffee. Lunch: low fat chicken/turkey sandwich. Dinner: sometimes L[employer]ist fry, [place] burger, green beans, occasional salad, brown rice  Exercise: Arthritis of the knee and some back pain are a limitation. Reports using an exercise bike frequently, most days for ~15 min/session if able but sometime days he is too tired.   Assessment/Plan: 83yoM with dyslipidemia--Most recent lipid panel (12/01/11): TChol 182 (Goal <200), Trig 324 (goal <150), HDL 32 (goal >40), LDL calc 85 (goal <70), AST 20, ALT 16. Triglycerides slightly worse but LDL is improved since last visit. Patient currently on Ezetimibe, Simvastatin, high dose fish oil, niacin, and metamucil. He will continue to work on diet and exercise, but his lipids are mostly near goal and stable on current regimen. Given his age more aggressive therapy is likely not warranted. Patient will continue current regimen and follow up with his PCP.  Thank you for the consult and we would be happy to see him again if he has any future issues with his lipids

## 2011-12-10 NOTE — Progress Notes (Signed)
Pt seen with Weston Brass, PharmD.  Agree with plan below.

## 2011-12-11 NOTE — Progress Notes (Signed)
I can not close without diagnosis.

## 2011-12-13 NOTE — Progress Notes (Signed)
No diagnosis

## 2012-01-18 DIAGNOSIS — J449 Chronic obstructive pulmonary disease, unspecified: Secondary | ICD-10-CM | POA: Diagnosis not present

## 2012-02-28 ENCOUNTER — Other Ambulatory Visit: Payer: Self-pay | Admitting: Cardiology

## 2012-02-28 DIAGNOSIS — I6529 Occlusion and stenosis of unspecified carotid artery: Secondary | ICD-10-CM

## 2012-03-02 ENCOUNTER — Encounter (INDEPENDENT_AMBULATORY_CARE_PROVIDER_SITE_OTHER): Payer: Medicare Other

## 2012-03-02 DIAGNOSIS — I6529 Occlusion and stenosis of unspecified carotid artery: Secondary | ICD-10-CM | POA: Diagnosis not present

## 2012-03-23 DIAGNOSIS — J449 Chronic obstructive pulmonary disease, unspecified: Secondary | ICD-10-CM | POA: Diagnosis not present

## 2012-03-23 DIAGNOSIS — N182 Chronic kidney disease, stage 2 (mild): Secondary | ICD-10-CM | POA: Diagnosis not present

## 2012-03-23 DIAGNOSIS — I10 Essential (primary) hypertension: Secondary | ICD-10-CM | POA: Diagnosis not present

## 2012-03-23 DIAGNOSIS — E785 Hyperlipidemia, unspecified: Secondary | ICD-10-CM | POA: Diagnosis not present

## 2012-04-13 DIAGNOSIS — R0902 Hypoxemia: Secondary | ICD-10-CM | POA: Diagnosis not present

## 2012-05-08 ENCOUNTER — Other Ambulatory Visit: Payer: Self-pay

## 2012-05-08 MED ORDER — FENOFIBRATE 160 MG PO TABS: 160.0000 mg | ORAL_TABLET | Freq: Every day | ORAL | Status: DC

## 2012-05-09 ENCOUNTER — Encounter: Payer: Self-pay | Admitting: *Deleted

## 2012-05-09 ENCOUNTER — Encounter: Payer: Self-pay | Admitting: Cardiology

## 2012-05-09 ENCOUNTER — Ambulatory Visit (INDEPENDENT_AMBULATORY_CARE_PROVIDER_SITE_OTHER): Payer: Medicare Other | Admitting: Cardiology

## 2012-05-09 VITALS — BP 122/80 | HR 72 | Wt 171.0 lb

## 2012-05-09 DIAGNOSIS — I6529 Occlusion and stenosis of unspecified carotid artery: Secondary | ICD-10-CM | POA: Diagnosis not present

## 2012-05-09 DIAGNOSIS — I2510 Atherosclerotic heart disease of native coronary artery without angina pectoris: Secondary | ICD-10-CM | POA: Diagnosis not present

## 2012-05-09 DIAGNOSIS — E785 Hyperlipidemia, unspecified: Secondary | ICD-10-CM | POA: Diagnosis not present

## 2012-05-09 DIAGNOSIS — I10 Essential (primary) hypertension: Secondary | ICD-10-CM

## 2012-05-09 LAB — HEPATIC FUNCTION PANEL
ALT: 18 U/L (ref 0–53)
Albumin: 4.1 g/dL (ref 3.5–5.2)
Alkaline Phosphatase: 31 U/L — ABNORMAL LOW (ref 39–117)
Total Protein: 6.9 g/dL (ref 6.0–8.3)

## 2012-05-09 LAB — LIPID PANEL
Cholesterol: 171 mg/dL (ref 0–200)
VLDL: 43.6 mg/dL — ABNORMAL HIGH (ref 0.0–40.0)

## 2012-05-09 LAB — LDL CHOLESTEROL, DIRECT: Direct LDL: 99.5 mg/dL

## 2012-05-09 NOTE — Patient Instructions (Addendum)
Your physician wants you to follow-up in: ONE YEAR WITH DR CRENSHAW You will receive a reminder letter in the mail two months in advance. If you don't receive a letter, please call our office to schedule the follow-up appointment.   Your physician recommends that you HAVE LAB WORK TODAY 

## 2012-05-09 NOTE — Progress Notes (Signed)
HPI: [name] is a very pleasant male for fu of coronary artery disease. He is status post bypass grafting in 1988 and 1999. He had an inferior infarct and underwent stenting of the vein graft to the right coronary artery. Ejection fraction was 62%. He had an episode of presyncope and underwent cardiac catheterization in 2007. This showed patent grafts with an EF of 50%. Myoview in Jan 2013 showed normal perfusion and EF 65. Carotid Dopplers in Dec 2013 showed 0-39% stenosis bilaterally. Abdominal CT in 2000 showed no aneurysm. ABIs in November of 2011 were normal. Patient last seen in Jan 2013. Since then, the patient denies any dyspnea on exertion, orthopnea, PND, pedal edema, palpitations, syncope or chest pain.   Current Outpatient Prescriptions  Medication Sig Dispense Refill  . albuterol (PROVENTIL) (2.5 MG/3ML) 0.083% nebulizer solution Take 2.5 mg by nebulization every 6 (six) hours as needed.        Marland Kitchen aspirin 81 MG tablet Take 81 mg by mouth daily.        . beclomethasone (QVAR) 80 MCG/ACT inhaler Inhale 2 puffs into the lungs as needed.        . ezetimibe-simvastatin (VYTORIN) 10-40 MG per tablet Take 1 tablet by mouth at bedtime.  90 tablet  3  . fenofibrate 160 MG tablet Take 1 tablet (160 mg total) by mouth daily.  90 tablet  2  . fish oil-omega-3 fatty acids 1000 MG capsule Take 8 g by mouth daily.        Marland Kitchen ipratropium (ATROVENT) 0.02 % nebulizer solution Take 500 mcg by nebulization as needed.       . metoprolol (LOPRESSOR) 50 MG tablet Take 1 tablet (50 mg total) by mouth 2 (two) times daily.  60 tablet  6  . niacin 500 MG tablet Take 2,000 mg by mouth daily.        . psyllium (METAMUCIL) 58.6 % powder Take 1 packet by mouth daily as needed.        . ramipril (ALTACE) 10 MG capsule Take 1 capsule (10 mg total) by mouth daily.  30 capsule  6  . ranitidine (ZANTAC) 150 MG tablet Take 150 mg by mouth 2 (two) times daily.         No current facility-administered medications for  this visit.     Past Medical History  Diagnosis Date  . Coronary artery disease     post CABG x3in 1988 with left nternal mammary artery to left anterior descending, vein graft to the obtuse marginal, vein graft to the right coronary artery  . Inferior myocardial infarction     Status post inferior myocardial infarction in 1988  . Hypertension   . Hyperlipidemia   . Hypertriglyceridemia   . Gastroesophageal reflux disease with ulceration   . Peptic ulcer disease   . Postoperative atrial fibrillation     history of   . Back pain   . Leg weakness     Past Surgical History  Procedure Laterality Date  . Coronary artery bypass graft  1988    x3in 1988 with left nternal mammary artery to left anterior descending, vein graft to the obtuse marginal, vein graft to the right coronary artery  . Coronary angioplasty with stent placement  11/1997    status post percutaneous coronary intervention and stenting with bare metal stent of the vein graft to the right coronary artery.  . Cardiac catheterization  06/2005     EF 50%. severe native CAD. SVG-RCA 50-60 othewise  all grafts patent    History   Social History  . Marital Status: Married    Spouse Name: N/A    Number of Children: N/A  . Years of Education: N/A   Occupational History  . Not on file.   Social History Main Topics  . Smoking status: Former Smoker    Quit date: 10/14/1961  . Smokeless tobacco: Not on file     Comment: Quit in 1963  . Alcohol Use: No  . Drug Use: Not on file  . Sexually Active: Not on file   Other Topics Concern  . Not on file   Social History Narrative  . No narrative on file    ROS: recent sinus congestion but no fevers or chills, productive cough, hemoptysis, dysphasia, odynophagia, melena, hematochezia, dysuria, hematuria, rash, seizure activity, orthopnea, PND, pedal edema, claudication. Remaining systems are negative.  Physical Exam: Well-developed well-nourished in no acute distress.    Skin is warm and dry.  HEENT is normal.  Neck is supple.  Chest with diminished breath sounds throughout Cardiovascular exam is regular rate and rhythm.  Abdominal exam nontender or distended. No masses palpated. Extremities show no edema. neuro grossly intact  ECG sinus rhythm at a rate of 72. First-degree AV block. Nonspecific ST changes.

## 2012-05-09 NOTE — Assessment & Plan Note (Signed)
Blood pressure controlled. Continue present medications. Check potassium and renal function. 

## 2012-05-09 NOTE — Assessment & Plan Note (Signed)
Continue aspirin and statin. 

## 2012-05-09 NOTE — Assessment & Plan Note (Signed)
Continue present medications. Check lipids and liver. 

## 2012-05-09 NOTE — Assessment & Plan Note (Signed)
Continue aspirin and statin.recent Myoview negative.

## 2012-06-07 DIAGNOSIS — N323 Diverticulum of bladder: Secondary | ICD-10-CM | POA: Diagnosis not present

## 2012-06-13 DIAGNOSIS — N320 Bladder-neck obstruction: Secondary | ICD-10-CM | POA: Diagnosis not present

## 2012-06-13 DIAGNOSIS — N390 Urinary tract infection, site not specified: Secondary | ICD-10-CM | POA: Diagnosis not present

## 2012-06-20 DIAGNOSIS — N320 Bladder-neck obstruction: Secondary | ICD-10-CM | POA: Diagnosis not present

## 2012-06-26 DIAGNOSIS — I10 Essential (primary) hypertension: Secondary | ICD-10-CM | POA: Diagnosis not present

## 2012-06-26 DIAGNOSIS — E785 Hyperlipidemia, unspecified: Secondary | ICD-10-CM | POA: Diagnosis not present

## 2012-06-26 DIAGNOSIS — Z23 Encounter for immunization: Secondary | ICD-10-CM | POA: Diagnosis not present

## 2012-06-26 DIAGNOSIS — J441 Chronic obstructive pulmonary disease with (acute) exacerbation: Secondary | ICD-10-CM | POA: Diagnosis not present

## 2012-06-26 DIAGNOSIS — J449 Chronic obstructive pulmonary disease, unspecified: Secondary | ICD-10-CM | POA: Diagnosis not present

## 2012-07-06 DIAGNOSIS — N390 Urinary tract infection, site not specified: Secondary | ICD-10-CM | POA: Diagnosis not present

## 2012-07-06 DIAGNOSIS — N320 Bladder-neck obstruction: Secondary | ICD-10-CM | POA: Diagnosis not present

## 2012-07-28 DIAGNOSIS — N320 Bladder-neck obstruction: Secondary | ICD-10-CM | POA: Diagnosis not present

## 2012-07-28 DIAGNOSIS — N390 Urinary tract infection, site not specified: Secondary | ICD-10-CM | POA: Diagnosis not present

## 2012-08-09 DIAGNOSIS — H6120 Impacted cerumen, unspecified ear: Secondary | ICD-10-CM | POA: Diagnosis not present

## 2012-10-27 DIAGNOSIS — I10 Essential (primary) hypertension: Secondary | ICD-10-CM | POA: Diagnosis not present

## 2012-10-27 DIAGNOSIS — E785 Hyperlipidemia, unspecified: Secondary | ICD-10-CM | POA: Diagnosis not present

## 2012-10-27 DIAGNOSIS — I2510 Atherosclerotic heart disease of native coronary artery without angina pectoris: Secondary | ICD-10-CM | POA: Diagnosis not present

## 2012-10-27 DIAGNOSIS — N400 Enlarged prostate without lower urinary tract symptoms: Secondary | ICD-10-CM | POA: Diagnosis not present

## 2012-10-27 DIAGNOSIS — N182 Chronic kidney disease, stage 2 (mild): Secondary | ICD-10-CM | POA: Diagnosis not present

## 2012-10-30 DIAGNOSIS — N390 Urinary tract infection, site not specified: Secondary | ICD-10-CM | POA: Diagnosis not present

## 2012-10-30 DIAGNOSIS — N320 Bladder-neck obstruction: Secondary | ICD-10-CM | POA: Diagnosis not present

## 2012-11-01 DIAGNOSIS — R0602 Shortness of breath: Secondary | ICD-10-CM | POA: Diagnosis not present

## 2012-11-01 DIAGNOSIS — J410 Simple chronic bronchitis: Secondary | ICD-10-CM | POA: Diagnosis not present

## 2012-11-01 DIAGNOSIS — R0902 Hypoxemia: Secondary | ICD-10-CM | POA: Diagnosis not present

## 2013-03-07 DIAGNOSIS — R5381 Other malaise: Secondary | ICD-10-CM | POA: Diagnosis not present

## 2013-03-07 DIAGNOSIS — Z23 Encounter for immunization: Secondary | ICD-10-CM | POA: Diagnosis not present

## 2013-03-07 DIAGNOSIS — N400 Enlarged prostate without lower urinary tract symptoms: Secondary | ICD-10-CM | POA: Diagnosis not present

## 2013-03-07 DIAGNOSIS — E785 Hyperlipidemia, unspecified: Secondary | ICD-10-CM | POA: Diagnosis not present

## 2013-03-07 DIAGNOSIS — I10 Essential (primary) hypertension: Secondary | ICD-10-CM | POA: Diagnosis not present

## 2013-03-20 DIAGNOSIS — E785 Hyperlipidemia, unspecified: Secondary | ICD-10-CM | POA: Diagnosis not present

## 2013-03-20 DIAGNOSIS — R7989 Other specified abnormal findings of blood chemistry: Secondary | ICD-10-CM | POA: Diagnosis not present

## 2013-04-09 DIAGNOSIS — F0391 Unspecified dementia with behavioral disturbance: Secondary | ICD-10-CM | POA: Diagnosis not present

## 2013-04-09 DIAGNOSIS — F03918 Unspecified dementia, unspecified severity, with other behavioral disturbance: Secondary | ICD-10-CM | POA: Diagnosis not present

## 2013-04-30 DIAGNOSIS — L02619 Cutaneous abscess of unspecified foot: Secondary | ICD-10-CM | POA: Diagnosis not present

## 2013-04-30 DIAGNOSIS — L03119 Cellulitis of unspecified part of limb: Secondary | ICD-10-CM | POA: Diagnosis not present

## 2013-05-04 DIAGNOSIS — N390 Urinary tract infection, site not specified: Secondary | ICD-10-CM | POA: Diagnosis not present

## 2013-05-04 DIAGNOSIS — N320 Bladder-neck obstruction: Secondary | ICD-10-CM | POA: Diagnosis not present

## 2013-05-07 DIAGNOSIS — J449 Chronic obstructive pulmonary disease, unspecified: Secondary | ICD-10-CM | POA: Diagnosis not present

## 2013-05-07 DIAGNOSIS — F03918 Unspecified dementia, unspecified severity, with other behavioral disturbance: Secondary | ICD-10-CM | POA: Diagnosis not present

## 2013-05-07 DIAGNOSIS — F0391 Unspecified dementia with behavioral disturbance: Secondary | ICD-10-CM | POA: Diagnosis not present

## 2013-05-10 ENCOUNTER — Ambulatory Visit: Payer: Medicare Other | Admitting: Cardiology

## 2013-05-15 DIAGNOSIS — I10 Essential (primary) hypertension: Secondary | ICD-10-CM | POA: Diagnosis not present

## 2013-05-15 DIAGNOSIS — R05 Cough: Secondary | ICD-10-CM | POA: Diagnosis not present

## 2013-05-15 DIAGNOSIS — E7800 Pure hypercholesterolemia, unspecified: Secondary | ICD-10-CM | POA: Diagnosis not present

## 2013-05-15 DIAGNOSIS — J9819 Other pulmonary collapse: Secondary | ICD-10-CM | POA: Diagnosis not present

## 2013-05-15 DIAGNOSIS — R059 Cough, unspecified: Secondary | ICD-10-CM | POA: Diagnosis not present

## 2013-05-15 DIAGNOSIS — J180 Bronchopneumonia, unspecified organism: Secondary | ICD-10-CM | POA: Diagnosis not present

## 2013-05-15 DIAGNOSIS — R0602 Shortness of breath: Secondary | ICD-10-CM | POA: Diagnosis not present

## 2013-05-15 DIAGNOSIS — J441 Chronic obstructive pulmonary disease with (acute) exacerbation: Secondary | ICD-10-CM | POA: Diagnosis not present

## 2013-05-15 DIAGNOSIS — Z79899 Other long term (current) drug therapy: Secondary | ICD-10-CM | POA: Diagnosis not present

## 2013-06-05 ENCOUNTER — Encounter (HOSPITAL_COMMUNITY): Payer: Self-pay | Admitting: Emergency Medicine

## 2013-06-05 ENCOUNTER — Inpatient Hospital Stay (HOSPITAL_COMMUNITY)
Admission: EM | Admit: 2013-06-05 | Discharge: 2013-06-07 | DRG: 287 | Disposition: A | Discharge status: Home / Self Care | Payer: Medicare Other | Attending: Cardiology | Admitting: Cardiology

## 2013-06-05 ENCOUNTER — Emergency Department (HOSPITAL_COMMUNITY): Payer: Medicare Other

## 2013-06-05 DIAGNOSIS — I2510 Atherosclerotic heart disease of native coronary artery without angina pectoris: Secondary | ICD-10-CM | POA: Diagnosis not present

## 2013-06-05 DIAGNOSIS — Y849 Medical procedure, unspecified as the cause of abnormal reaction of the patient, or of later complication, without mention of misadventure at the time of the procedure: Secondary | ICD-10-CM | POA: Diagnosis present

## 2013-06-05 DIAGNOSIS — Z951 Presence of aortocoronary bypass graft: Secondary | ICD-10-CM | POA: Diagnosis not present

## 2013-06-05 DIAGNOSIS — T82897A Other specified complication of cardiac prosthetic devices, implants and grafts, initial encounter: Secondary | ICD-10-CM | POA: Diagnosis present

## 2013-06-05 DIAGNOSIS — Z87891 Personal history of nicotine dependence: Secondary | ICD-10-CM

## 2013-06-05 DIAGNOSIS — I6529 Occlusion and stenosis of unspecified carotid artery: Secondary | ICD-10-CM | POA: Diagnosis present

## 2013-06-05 DIAGNOSIS — I252 Old myocardial infarction: Secondary | ICD-10-CM

## 2013-06-05 DIAGNOSIS — Z79899 Other long term (current) drug therapy: Secondary | ICD-10-CM

## 2013-06-05 DIAGNOSIS — K219 Gastro-esophageal reflux disease without esophagitis: Secondary | ICD-10-CM | POA: Diagnosis present

## 2013-06-05 DIAGNOSIS — I2582 Chronic total occlusion of coronary artery: Secondary | ICD-10-CM | POA: Diagnosis not present

## 2013-06-05 DIAGNOSIS — I2584 Coronary atherosclerosis due to calcified coronary lesion: Secondary | ICD-10-CM | POA: Diagnosis present

## 2013-06-05 DIAGNOSIS — F0390 Unspecified dementia without behavioral disturbance: Secondary | ICD-10-CM | POA: Diagnosis present

## 2013-06-05 DIAGNOSIS — R079 Chest pain, unspecified: Secondary | ICD-10-CM

## 2013-06-05 DIAGNOSIS — I200 Unstable angina: Secondary | ICD-10-CM | POA: Diagnosis present

## 2013-06-05 DIAGNOSIS — R55 Syncope and collapse: Secondary | ICD-10-CM | POA: Diagnosis present

## 2013-06-05 DIAGNOSIS — I10 Essential (primary) hypertension: Secondary | ICD-10-CM | POA: Diagnosis not present

## 2013-06-05 DIAGNOSIS — I440 Atrioventricular block, first degree: Secondary | ICD-10-CM | POA: Diagnosis present

## 2013-06-05 DIAGNOSIS — Z7982 Long term (current) use of aspirin: Secondary | ICD-10-CM

## 2013-06-05 DIAGNOSIS — I658 Occlusion and stenosis of other precerebral arteries: Secondary | ICD-10-CM | POA: Diagnosis present

## 2013-06-05 DIAGNOSIS — E785 Hyperlipidemia, unspecified: Secondary | ICD-10-CM | POA: Diagnosis present

## 2013-06-05 DIAGNOSIS — T463X5A Adverse effect of coronary vasodilators, initial encounter: Secondary | ICD-10-CM | POA: Diagnosis present

## 2013-06-05 DIAGNOSIS — I369 Nonrheumatic tricuspid valve disorder, unspecified: Secondary | ICD-10-CM | POA: Diagnosis not present

## 2013-06-05 LAB — BASIC METABOLIC PANEL
BUN: 22 mg/dL (ref 6–23)
CHLORIDE: 104 meq/L (ref 96–112)
CO2: 22 meq/L (ref 19–32)
Calcium: 9.2 mg/dL (ref 8.4–10.5)
Creatinine, Ser: 1.29 mg/dL (ref 0.50–1.35)
GFR calc Af Amer: 58 mL/min — ABNORMAL LOW (ref >90)
GFR calc non Af Amer: 50 mL/min — ABNORMAL LOW (ref >90)
Glucose, Bld: 129 mg/dL — ABNORMAL HIGH (ref 70–99)
Potassium: 4.4 mEq/L (ref 3.7–5.3)
Sodium: 139 mEq/L (ref 137–147)

## 2013-06-05 LAB — CBC
HCT: 40.9 % (ref 39.0–52.0)
Hemoglobin: 14.3 g/dL (ref 13.0–17.0)
MCH: 32.1 pg (ref 26.0–34.0)
MCHC: 35 g/dL (ref 30.0–36.0)
MCV: 91.9 fL (ref 78.0–100.0)
Platelets: 125 10*3/uL — ABNORMAL LOW (ref 150–400)
RBC: 4.45 MIL/uL (ref 4.22–5.81)
RDW: 13.6 % (ref 11.5–15.5)
WBC: 6.4 10*3/uL (ref 4.0–10.5)

## 2013-06-05 LAB — PROTIME-INR
INR: 1.05 (ref 0.00–1.49)
PROTHROMBIN TIME: 13.5 s (ref 11.6–15.2)

## 2013-06-05 LAB — MRSA PCR SCREENING: MRSA by PCR: NEGATIVE

## 2013-06-05 LAB — PRO B NATRIURETIC PEPTIDE: Pro B Natriuretic peptide (BNP): 165 pg/mL (ref 0–450)

## 2013-06-05 LAB — TROPONIN I: Troponin I: <0.3 ng/mL (ref <0.30)

## 2013-06-05 MED ORDER — ASPIRIN 81 MG PO CHEW
81.0000 mg | CHEWABLE_TABLET | ORAL | Status: AC
  Administered 2013-06-06: 81 mg via ORAL
  Filled 2013-06-05: qty 1

## 2013-06-05 MED ORDER — SODIUM CHLORIDE 0.9 % IV SOLN: 250.0000 mL | INTRAVENOUS | Status: DC | PRN

## 2013-06-05 MED ORDER — HEPARIN (PORCINE) IN NACL 100-0.45 UNIT/ML-% IJ SOLN
1200.0000 [IU]/h | INTRAMUSCULAR | Status: DC
  Administered 2013-06-05: 1000 [IU]/h via INTRAVENOUS
  Administered 2013-06-06: 1200 [IU]/h via INTRAVENOUS
  Filled 2013-06-05 (×2): qty 250

## 2013-06-05 MED ORDER — ALBUTEROL SULFATE (2.5 MG/3ML) 0.083% IN NEBU
2.5000 mg | INHALATION_SOLUTION | Freq: Four times a day (QID) | RESPIRATORY_TRACT | Status: DC | PRN
  Administered 2013-06-06: 20:00:00 2.5 mg via RESPIRATORY_TRACT
  Filled 2013-06-05: qty 3

## 2013-06-05 MED ORDER — SODIUM CHLORIDE 0.9 % IJ SOLN
3.0000 mL | Freq: Two times a day (BID) | INTRAMUSCULAR | Status: DC
  Administered 2013-06-06: 3 mL via INTRAVENOUS

## 2013-06-05 MED ORDER — IPRATROPIUM BROMIDE 0.02 % IN SOLN: 500.0000 ug | Freq: Four times a day (QID) | RESPIRATORY_TRACT | Status: DC | PRN

## 2013-06-05 MED ORDER — METOPROLOL TARTRATE 50 MG PO TABS
50.0000 mg | ORAL_TABLET | Freq: Two times a day (BID) | ORAL | Status: DC
  Administered 2013-06-05 – 2013-06-07 (×4): 50 mg via ORAL
  Filled 2013-06-05: qty 2
  Filled 2013-06-05 (×5): qty 1

## 2013-06-05 MED ORDER — FENOFIBRATE 160 MG PO TABS
160.0000 mg | ORAL_TABLET | Freq: Every day | ORAL | Status: DC
  Administered 2013-06-05 – 2013-06-07 (×3): 160 mg via ORAL
  Filled 2013-06-05 (×3): qty 1

## 2013-06-05 MED ORDER — ASPIRIN EC 81 MG PO TBEC
81.0000 mg | DELAYED_RELEASE_TABLET | Freq: Every day | ORAL | Status: DC
  Filled 2013-06-05: qty 1

## 2013-06-05 MED ORDER — HEPARIN BOLUS VIA INFUSION
4000.0000 [IU] | Freq: Once | INTRAVENOUS | Status: AC
  Administered 2013-06-05: 4000 [IU] via INTRAVENOUS
  Filled 2013-06-05: qty 4000

## 2013-06-05 MED ORDER — ASPIRIN 81 MG PO CHEW
324.0000 mg | CHEWABLE_TABLET | Freq: Once | ORAL | Status: AC
Start: 2013-06-05 — End: 2013-06-05
  Administered 2013-06-05: 324 mg via ORAL
  Filled 2013-06-05: qty 4

## 2013-06-05 MED ORDER — BECLOMETHASONE DIPROPIONATE 80 MCG/ACT IN AERS
2.0000 | INHALATION_SPRAY | RESPIRATORY_TRACT | Status: DC | PRN
  Filled 2013-06-05: qty 8.7

## 2013-06-05 MED ORDER — OMEGA-3-ACID ETHYL ESTERS 1 G PO CAPS
2.0000 g | ORAL_CAPSULE | Freq: Two times a day (BID) | ORAL | Status: DC
  Administered 2013-06-05 – 2013-06-07 (×4): 2 g via ORAL
  Filled 2013-06-05 (×7): qty 2

## 2013-06-05 MED ORDER — OMEGA-3 FATTY ACIDS 1000 MG PO CAPS: 4.0000 g | ORAL_CAPSULE | Freq: Two times a day (BID) | ORAL | Status: DC

## 2013-06-05 MED ORDER — SODIUM CHLORIDE 0.9 % IJ SOLN: 3.0000 mL | INTRAMUSCULAR | Status: DC | PRN

## 2013-06-05 MED ORDER — EZETIMIBE-SIMVASTATIN 10-40 MG PO TABS
1.0000 | ORAL_TABLET | Freq: Every day | ORAL | Status: DC
  Administered 2013-06-05 – 2013-06-07 (×3): 1 via ORAL
  Filled 2013-06-05 (×3): qty 1

## 2013-06-05 MED ORDER — TAMSULOSIN HCL 0.4 MG PO CAPS
0.8000 mg | ORAL_CAPSULE | Freq: Every day | ORAL | Status: DC
  Administered 2013-06-06: 0.8 mg via ORAL
  Filled 2013-06-05 (×3): qty 2

## 2013-06-05 MED ORDER — NITROGLYCERIN 0.4 MG SL SUBL: 0.4000 mg | SUBLINGUAL_TABLET | SUBLINGUAL | Status: DC | PRN

## 2013-06-05 MED ORDER — RAMIPRIL 10 MG PO CAPS
10.0000 mg | ORAL_CAPSULE | Freq: Every day | ORAL | Status: DC
  Administered 2013-06-05 – 2013-06-06 (×2): 10 mg via ORAL
  Filled 2013-06-05 (×2): qty 1

## 2013-06-05 MED ORDER — SODIUM CHLORIDE 0.9 % IV BOLUS (SEPSIS)
500.0000 mL | Freq: Once | INTRAVENOUS | Status: AC
  Administered 2013-06-05: 500 mL via INTRAVENOUS

## 2013-06-05 MED ORDER — NIACIN 500 MG PO TABS
1000.0000 mg | ORAL_TABLET | Freq: Every day | ORAL | Status: DC
  Administered 2013-06-06: 21:00:00 1000 mg via ORAL
  Filled 2013-06-05 (×4): qty 2

## 2013-06-05 NOTE — ED Notes (Signed)
Assisted up to the [facility], voided and passed gas.

## 2013-06-05 NOTE — ED Notes (Signed)
Heart Healthy Diet ordered  

## 2013-06-05 NOTE — ED Notes (Signed)
Cardiology PA to bedside

## 2013-06-05 NOTE — ED Notes (Signed)
Pt here for Teller ems for chest pain on set this, relieved with NTG, pt had syncopal episode after 2nd ntg and with ems bp in 65'H systolic,

## 2013-06-05 NOTE — H&P (Signed)
[name] is an 80 y.o. male.   Chief Complaint:  CP HPI:   [name] is a very pleasant male with coronary artery disease, HTN, HL ad mild dementia. He is status post bypass grafting in 1988 and 1999. He had an inferior infarct and underwent stenting of the vein graft to the right coronary artery. Ejection fraction was 62%. He had an episode of presyncope and underwent cardiac catheterization in 2007. This showed patent grafts with an EF of 50%. Myoview in Jan 2013 showed normal perfusion and EF 65. Carotid Dopplers in Dec 2013 showed 0-39% stenosis bilaterally. Abdominal CT in 2000 showed no aneurysm. ABIs in November of 2011 were normal. Patient last seen in Jan 2013. Since then, the patient denies any dyspnea on exertion, orthopnea, PND, pedal edema, palpitations, syncope or chest pain.  The patient's wife reports that at 1000hrs this morning he developed CP while lying down.  She gave him a SL NTG which promptly relieved the pain.  30-40 minutes later the pain came back and another NTG relieved it again.  He then proceeded to get up and walk to the bathroom with his walker.  Hie wife states he passed out.  She was able to get him tot he seat on the walker but his head fell back.  He then became pale and stopped breathing.  She started CPR and and in ~10-30sec he started breathing again.  His wife also reports N, V, SOB.  The pt says he became dizzy when he started walking.  No orthopnea, PND, LEE.   The patient currently denies fever, cough, congestion, abdominal pain, hematochezia, melena. Gets around with walker. Struggles with mild dementia.    Medications:  Prior to Admission medications   Medication Sig Start Date End Date Taking? Authorizing Provider  albuterol (PROVENTIL) (2.5 MG/3ML) 0.083% nebulizer solution Take 2.5 mg by nebulization every 6 (six) hours as needed.     Yes Historical Provider, MD  aspirin EC 81 MG tablet Take 81 mg by mouth at bedtime.   Yes Historical Provider,  MD  beclomethasone (QVAR) 80 MCG/ACT inhaler Inhale 2 puffs into the lungs as needed (for shortness of breath).    Yes Historical Provider, MD  ezetimibe-simvastatin (VYTORIN) 10-40 MG per tablet Take 1 tablet by mouth daily.   Yes Historical Provider, MD  fenofibrate 160 MG tablet Take 160 mg by mouth daily.   Yes Historical Provider, MD  fish oil-omega-3 fatty acids 1000 MG capsule Take 4 g by mouth 2 (two) times daily.    Yes Historical Provider, MD  ipratropium (ATROVENT) 0.02 % nebulizer solution Take 500 mcg by nebulization every 6 (six) hours as needed for shortness of breath.    Yes Historical Provider, MD  metoprolol (LOPRESSOR) 50 MG tablet Take 50 mg by mouth 2 (two) times daily.   Yes Historical Provider, MD  niacin 500 MG tablet Take 1,000 mg by mouth at bedtime.    Yes Historical Provider, MD  nitroGLYCERIN (NITROSTAT) 0.4 MG SL tablet Place 0.4 mg under the tongue every 5 (five) minutes as needed for chest pain.   Yes Historical Provider, MD  ramipril (ALTACE) 10 MG capsule Take 10 mg by mouth daily.   Yes Historical Provider, MD  ranitidine (ZANTAC) 150 MG tablet Take 150 mg by mouth 2 (two) times daily.     Yes Historical Provider, MD  tamsulosin (FLOMAX) 0.4 MG CAPS capsule Take 0.8 mg by mouth daily after supper.   Yes Historical  Provider, MD      Past Medical History  Diagnosis Date  . Coronary artery disease     post CABG x3in 1988 with left nternal mammary artery to left anterior descending, vein graft to the obtuse marginal, vein graft to the right coronary artery  . Inferior myocardial infarction     Status post inferior myocardial infarction in 1988  . Hypertension   . Hyperlipidemia   . Hypertriglyceridemia   . Gastroesophageal reflux disease with ulceration   . Peptic ulcer disease   . Postoperative atrial fibrillation     history of   . Back pain   . Leg weakness     Past Surgical History  Procedure Laterality Date  . Coronary artery bypass graft  1988      x3in 1988 with left nternal mammary artery to left anterior descending, vein graft to the obtuse marginal, vein graft to the right coronary artery  . Coronary angioplasty with stent placement  11/1997    status post percutaneous coronary intervention and stenting with bare metal stent of the vein graft to the right coronary artery.  . Cardiac catheterization  06/2005     EF 50%. severe native CAD. SVG-RCA 50-60 othewise all grafts patent    Family History  Problem Relation Age of Onset  . Stroke Father    Social History:  reports that he quit smoking about 51 years ago. He does not have any smokeless tobacco history on file. He reports that he does not drink alcohol. His drug history is not on file.  Allergies:  Allergies  Allergen Reactions  . Penicillins     REACTION: hives     (Not in a hospital admission)  Results for orders placed during the hospital encounter of 06/05/13 (from the past 48 hour(s))  CBC     Status: Abnormal   Collection Time    06/05/13 12:43 PM      Result Value Ref Range   WBC 6.4  4.0 - 10.5 K/uL   RBC 4.45  4.22 - 5.81 MIL/uL   Hemoglobin 14.3  13.0 - 17.0 g/dL   HCT 40.9  39.0 - 52.0 %   MCV 91.9  78.0 - 100.0 fL   MCH 32.1  26.0 - 34.0 pg   MCHC 35.0  30.0 - 36.0 g/dL   RDW 13.6  11.5 - 15.5 %   Platelets 125 (*) 150 - 400 K/uL  BASIC METABOLIC PANEL     Status: Abnormal   Collection Time    06/05/13 12:43 PM      Result Value Ref Range   Sodium 139  137 - 147 mEq/L   Potassium 4.4  3.7 - 5.3 mEq/L   Chloride 104  96 - 112 mEq/L   CO2 22  19 - 32 mEq/L   Glucose, Bld 129 (*) 70 - 99 mg/dL   BUN 22  6 - 23 mg/dL   Creatinine, Ser 1.29  0.50 - 1.35 mg/dL   Calcium 9.2  8.4 - 10.5 mg/dL   GFR calc non Af Amer 50 (*) >90 mL/min   GFR calc Af Amer 58 (*) >90 mL/min   Comment: (NOTE)     The eGFR has been calculated using the CKD EPI equation.     This calculation has not been validated in all clinical situations.     eGFR's persistently  <90 mL/min signify possible Chronic Kidney     Disease.  PRO B NATRIURETIC PEPTIDE     Status:  None   Collection Time    06/05/13 12:43 PM      Result Value Ref Range   Pro B Natriuretic peptide (BNP) 165.0  0 - 450 pg/mL  PROTIME-INR     Status: None   Collection Time    06/05/13 12:43 PM      Result Value Ref Range   Prothrombin Time 13.5  11.6 - 15.2 seconds   INR 1.05  0.00 - 1.49  TROPONIN I     Status: None   Collection Time    06/05/13 12:43 PM      Result Value Ref Range   Troponin I <0.30  <0.30 ng/mL   Comment:            Due to the release kinetics of cTnI,     a negative result within the first hours     of the onset of symptoms does not rule out     myocardial infarction with certainty.     If myocardial infarction is still suspected,     repeat the test at appropriate intervals.   Dg Chest Port 1 View  06/05/2013   CLINICAL DATA:  Chest pain, near syncope  EXAM: PORTABLE CHEST - 1 VIEW  COMPARISON:  Chest x-ray of 05/15/2013  FINDINGS: No active infiltrate or effusion is seen. The lungs remain slightly hyper aerated. Cardiomegaly is stable. Median sternotomy sutures are noted from prior CABG.  IMPRESSION: Stable cardiomegaly.  No active lung disease.   Electronically Signed   By: Ivar Drape M.D.   On: 06/05/2013 13:20    Review of Systems  Constitutional: Positive for fever. Negative for diaphoresis.  HENT: Negative for congestion and sore throat.   Respiratory: Positive for shortness of breath. Negative for cough and wheezing.   Cardiovascular: Positive for chest pain. Negative for palpitations, orthopnea, leg swelling and PND.  Gastrointestinal: Positive for nausea. Negative for vomiting, abdominal pain, blood in stool and melena.  Genitourinary: Negative for hematuria.  Musculoskeletal: Negative for myalgias.  Neurological: Positive for dizziness and weakness.  All other systems reviewed and are negative.    Blood pressure 116/64, pulse 58, temperature  97.5 F (36.4 C), temperature source Oral, resp. rate 17, height _0  (1.803 m), weight 171 lb (77.565 kg), SpO2 97.00%. Physical Exam  Constitutional: He is oriented to person, place, and time. He appears well-developed and well-nourished. No distress.  HENT:  Head: Normocephalic and atraumatic.  Mouth/Throat: Oropharynx is clear and moist. No oropharyngeal exudate.  Eyes: EOM are normal. Pupils are equal, round, and reactive to light. No scleral icterus.  Neck: Normal range of motion. Neck supple. No JVD present.  Cardiovascular: Normal rate, regular rhythm, S1 normal and S2 normal.   No murmur heard. Pulses:      Radial pulses are 2+ on the right side, and 2+ on the left side.       Dorsalis pedis pulses are 2+ on the right side, and 2+ on the left side.  No Carotid bruits  Respiratory: Effort normal. He has no wheezes. He has no rales.  GI: Soft. Bowel sounds are normal. He exhibits no distension. There is no tenderness.  Musculoskeletal: He exhibits no edema.  Lymphadenopathy:    He has no cervical adenopathy.  Neurological: He is alert and oriented to person, place, and time. He exhibits normal muscle tone.  Skin: Skin is warm and dry.  Psychiatric: He has a normal mood and affect.     Assessment/Plan Principal Problem:  Unstable angina Active Problems:   Syncope   HYPERLIPIDEMIA-MIXED   HYPERTENSION, BENIGN   CAROTID ARTERY DISEASE   S/P CABG (coronary artery bypass graft) 1988  Plan:  Admit to stepdown.  Cycle troponin.  Add IV heparin.  Syncope likely form Hypotension after NTG and sudden position change.  Will monitor on telemetry for arrhythmia.  Left heart cath tomorrow.   Tarri Fuller 06/05/2013, 5:56 PM  Patient seen and examined with Tarri Fuller, PA-C. We discussed all aspects of the encounter. I agree with the assessment and plan as stated above. Symptoms very concerning for [place]. Suspect syncope from NTG. Will admit for R/O MI and cath tomorrow. Start  heparin. Continue asa and statin. Watch for sundowning.   Maxten Shuler,MD 6:50 PM

## 2013-06-05 NOTE — ED Notes (Signed)
Paged and spoke with cardiology re pt's admission, reports that they will see and admit the pt

## 2013-06-05 NOTE — ED Provider Notes (Signed)
CSN: [id number]     Arrival date & time 06/05/13  1224 History   First MD Initiated Contact with Patient 06/05/13 1230     Chief Complaint  Patient presents with  . Near Syncope  . Chest Pain     (Consider location/radiation/quality/duration/timing/severity/associated sxs/prior Treatment) HPI  This is an 80 year old male with a history of coronary artery disease, hypertension, hyperlipidemia who presents with chest pain. Patient reports onset of chest pain this morning at approximately 10 AM. He took one nitroglycerin with complete resolution of pain. Patient reports pain with right-sided and radiated to his back. It was similar to his prior anginal pain. Patient reports second episode of pain approximately 30 minutes later. He took a second nitroglycerin with relief of the pain. Patient reports that he stood up and had a syncopal episode. He did not hit his head. He denies any neck pain. Per EMS, patient was awake upon arrival. Mild hypertension noted with systolic in the 24P. Patient is currently chest pain-free. No recent fevers or cough.  Past Medical History  Diagnosis Date  . Coronary artery disease     post CABG x3in 1988 with left nternal mammary artery to left anterior descending, vein graft to the obtuse marginal, vein graft to the right coronary artery  . Inferior myocardial infarction     Status post inferior myocardial infarction in 1988  . Hypertension   . Hyperlipidemia   . Hypertriglyceridemia   . Gastroesophageal reflux disease with ulceration   . Peptic ulcer disease   . Postoperative atrial fibrillation     history of   . Back pain   . Leg weakness    Past Surgical History  Procedure Laterality Date  . Coronary artery bypass graft  1988    x3in 1988 with left nternal mammary artery to left anterior descending, vein graft to the obtuse marginal, vein graft to the right coronary artery  . Coronary angioplasty with stent placement  11/1997    status post  percutaneous coronary intervention and stenting with bare metal stent of the vein graft to the right coronary artery.  . Cardiac catheterization  06/2005     EF 50%. severe native CAD. SVG-RCA 50-60 othewise all grafts patent   Family History  Problem Relation Age of Onset  . Stroke Father    History  Substance Use Topics  . Smoking status: Former Smoker    Quit date: 10/14/1961  . Smokeless tobacco: Not on file     Comment: Quit in 1963  . Alcohol Use: No    Review of Systems  Constitutional: Negative.  Negative for fever.  Respiratory: Positive for chest tightness. Negative for shortness of breath.   Cardiovascular: Positive for chest pain. Negative for leg swelling.  Gastrointestinal: Negative.  Negative for abdominal pain.  Genitourinary: Negative.  Negative for dysuria.  Musculoskeletal: Negative for back pain and neck pain.  Skin: Negative for wound.  Neurological: Positive for syncope. Negative for dizziness, weakness and headaches.  All other systems reviewed and are negative.      Allergies  Penicillins  Home Medications   Current Outpatient Rx  Name  Route  Sig  Dispense  Refill  . albuterol (PROVENTIL) (2.5 MG/3ML) 0.083% nebulizer solution   Nebulization   Take 2.5 mg by nebulization every 6 (six) hours as needed.           Marland Kitchen aspirin EC 81 MG tablet   Oral   Take 81 mg by mouth at bedtime.         Marland Kitchen  beclomethasone (QVAR) 80 MCG/ACT inhaler   Inhalation   Inhale 2 puffs into the lungs as needed (for shortness of breath).          . ezetimibe-simvastatin (VYTORIN) 10-40 MG per tablet   Oral   Take 1 tablet by mouth daily.         . fenofibrate 160 MG tablet   Oral   Take 160 mg by mouth daily.         . fish oil-omega-3 fatty acids 1000 MG capsule   Oral   Take 4 g by mouth 2 (two) times daily.          Marland Kitchen ipratropium (ATROVENT) 0.02 % nebulizer solution   Nebulization   Take 500 mcg by nebulization every 6 (six) hours as needed for  shortness of breath.          . metoprolol (LOPRESSOR) 50 MG tablet   Oral   Take 50 mg by mouth 2 (two) times daily.         . niacin 500 MG tablet   Oral   Take 1,000 mg by mouth at bedtime.          . nitroGLYCERIN (NITROSTAT) 0.4 MG SL tablet   Sublingual   Place 0.4 mg under the tongue every 5 (five) minutes as needed for chest pain.         . ramipril (ALTACE) 10 MG capsule   Oral   Take 10 mg by mouth daily.         . ranitidine (ZANTAC) 150 MG tablet   Oral   Take 150 mg by mouth 2 (two) times daily.           . tamsulosin (FLOMAX) 0.4 MG CAPS capsule   Oral   Take 0.8 mg by mouth daily after supper.          BP 137/76  Pulse 72  Temp(Src) 97.5 F (36.4 C) (Oral)  Resp 24  Ht 5\' 11"  (1.803 m)  Wt 171 lb (77.565 kg)  BMI 23.86 kg/m2  SpO2 90% Physical Exam  Nursing note and vitals reviewed. Constitutional: He is oriented to person, place, and time. No distress.  Elderly  HENT:  Head: Normocephalic and atraumatic.  Mucous membranes dry  Neck: Neck supple. No JVD present.  Cardiovascular: Normal rate, regular rhythm and normal heart sounds.   No murmur heard. Pulmonary/Chest: Effort normal and breath sounds normal. No respiratory distress. He has no wheezes.  Midline sternotomy scar  Abdominal: Soft. Bowel sounds are normal. There is no tenderness. There is no rebound.  Musculoskeletal: He exhibits edema.  Lymphadenopathy:    He has no cervical adenopathy.  Neurological: He is alert and oriented to person, place, and time.  Skin: Skin is warm and dry.  Psychiatric: He has a normal mood and affect.    ED Course  Procedures (including critical care time) Labs Review Labs Reviewed  CBC - Abnormal; Notable for the following:    Platelets 125 (*)    All other components within normal limits  BASIC METABOLIC PANEL - Abnormal; Notable for the following:    Glucose, Bld 129 (*)    GFR calc non Af Amer 50 (*)    GFR calc Af Amer 58 (*)     All other components within normal limits  PRO B NATRIURETIC PEPTIDE  PROTIME-INR  TROPONIN I  HEPARIN LEVEL (UNFRACTIONATED)  HEPARIN LEVEL (UNFRACTIONATED)  CBC   Imaging Review Dg Chest [facility] [address]  06/05/2013   CLINICAL DATA:  Chest pain, near syncope  EXAM: PORTABLE CHEST - 1 VIEW  COMPARISON:  Chest x-ray of 05/15/2013  FINDINGS: No active infiltrate or effusion is seen. The lungs remain slightly hyper aerated. Cardiomegaly is stable. Median sternotomy sutures are noted from prior CABG.  IMPRESSION: Stable cardiomegaly.  No active lung disease.   Electronically Signed   By: Ivar Drape M.D.   On: 06/05/2013 13:20     EKG Interpretation None     EKG independently reviewed by myself: Sinus bradycardia with a rate of 52, less than 1 mm ST elevation inferior leads, similar to prior MDM   Final diagnoses:  Chest pain  Syncope    Patient presents for chest pain and syncope. Syncope likely secondary to nitroglycerin administration. Currently patient is chest pain-free. Story concerning for unstable angina. Workup reassuring at this time. Patient has remained chest pain-free in the ER. Patient is a patient of Dr. Stanford Breed. Cardiology to evaluate and admit.    Merryl Hacker, MD 06/06/13 [phone]

## 2013-06-05 NOTE — Progress Notes (Signed)
ANTICOAGULATION CONSULT NOTE - Initial Consult  Pharmacy Consult for heparin gtt Indication: chest pain/ACS  Allergies  Allergen Reactions  . Penicillins     REACTION: hives    Patient Measurements: Height: 5\' 11"  (180.3 cm) Weight: 171 lb (77.565 kg) IBW/kg (Calculated) : 75.3 Heparin Dosing Weight: 77.6 kg (actual weight)  Vital Signs: Temp: 97.5 F (36.4 C) (03/17 1246) Temp src: Oral (03/17 1246) BP: 130/71 mmHg (03/17 1830) Pulse Rate: 57 (03/17 1830)  Labs:  Recent Labs  06/05/13 1243  HGB 14.3  HCT 40.9  PLT 125*  LABPROT 13.5  INR 1.05  CREATININE 1.29  TROPONINI <0.30    Estimated Creatinine Clearance: 47 ml/min (by C-G formula based on Cr of 1.29).   Medical History: Past Medical History  Diagnosis Date  . Coronary artery disease     post CABG x3in 1988 with left nternal mammary artery to left anterior descending, vein graft to the obtuse marginal, vein graft to the right coronary artery  . Inferior myocardial infarction     Status post inferior myocardial infarction in 1988  . Hypertension   . Hyperlipidemia   . Hypertriglyceridemia   . Gastroesophageal reflux disease with ulceration   . Peptic ulcer disease   . Postoperative atrial fibrillation     history of   . Back pain   . Leg weakness     Assessment: 81 yo M with hx of CABG ([place]) and cath in 2007 presents to ED after CP this AM relieved with NTGx2 and syncope episode in which his wife began CPR.  She reports return of breathing after 10-30 seconds.  Pharmacy consulted to begin heparin gtt for ACS.  Initial labs reveal H/H wnl and Plt's at 125.  SCr is 1.29 with estimated CrCl ~47.  I spoke with patient/family, and the only "blood thinner" he is currently taking is ASA.  He reports no recent bleeding issues and has never had a "head bleed".  Goal of Therapy:  Heparin level 0.3-0.7 units/ml Monitor platelets by anticoagulation protocol: Yes   Plan:  - give heparin IV bolus 4000  units, followed by 1000 units/hr - draw 8h HL - daily HL and CBC - monitor for s/s of bleeding  Ovid Curd E. Jacqlyn Larsen, PharmD Clinical Pharmacist - Resident Pager: [phone] Pharmacy: [phone] 06/05/2013 7:02 PM

## 2013-06-06 ENCOUNTER — Encounter (HOSPITAL_COMMUNITY)
Admission: EM | Disposition: A | Discharge status: Home / Self Care | Payer: Medicare Other | Source: Home / Self Care | Attending: Cardiology

## 2013-06-06 DIAGNOSIS — I200 Unstable angina: Secondary | ICD-10-CM | POA: Diagnosis not present

## 2013-06-06 DIAGNOSIS — T82897A Other specified complication of cardiac prosthetic devices, implants and grafts, initial encounter: Secondary | ICD-10-CM | POA: Diagnosis not present

## 2013-06-06 DIAGNOSIS — R079 Chest pain, unspecified: Secondary | ICD-10-CM

## 2013-06-06 DIAGNOSIS — I2510 Atherosclerotic heart disease of native coronary artery without angina pectoris: Secondary | ICD-10-CM

## 2013-06-06 DIAGNOSIS — I2582 Chronic total occlusion of coronary artery: Secondary | ICD-10-CM | POA: Diagnosis not present

## 2013-06-06 HISTORY — PX: LEFT HEART CATHETERIZATION WITH CORONARY/GRAFT ANGIOGRAM: SHX5450

## 2013-06-06 LAB — HEPARIN LEVEL (UNFRACTIONATED): Heparin Unfractionated: 0.16 IU/mL — ABNORMAL LOW (ref 0.30–0.70)

## 2013-06-06 LAB — TROPONIN I: Troponin I: <0.3 ng/mL (ref <0.30)

## 2013-06-06 LAB — TSH: TSH: 0.668 u[IU]/mL (ref 0.350–4.500)

## 2013-06-06 SURGERY — LEFT HEART CATHETERIZATION WITH CORONARY/GRAFT ANGIOGRAM
Anesthesia: LOCAL

## 2013-06-06 MED ORDER — SODIUM CHLORIDE 0.9 % IV SOLN
1.0000 mL/kg/h | INTRAVENOUS | Status: AC
  Administered 2013-06-06: 1 mL/kg/h via INTRAVENOUS

## 2013-06-06 MED ORDER — MIDAZOLAM HCL 2 MG/2ML IJ SOLN
INTRAMUSCULAR | Status: AC
  Filled 2013-06-06: qty 2

## 2013-06-06 MED ORDER — ALUM & MAG HYDROXIDE-SIMETH 200-200-20 MG/5ML PO SUSP
30.0000 mL | ORAL | Status: DC | PRN
  Administered 2013-06-06: 30 mL via ORAL
  Filled 2013-06-06 (×2): qty 30

## 2013-06-06 MED ORDER — ONDANSETRON HCL 4 MG/2ML IJ SOLN
4.0000 mg | Freq: Four times a day (QID) | INTRAMUSCULAR | Status: DC | PRN
  Administered 2013-06-07: 02:00:00 4 mg via INTRAVENOUS
  Filled 2013-06-06: qty 2

## 2013-06-06 MED ORDER — VERAPAMIL HCL 2.5 MG/ML IV SOLN
INTRAVENOUS | Status: AC
  Filled 2013-06-06: qty 2

## 2013-06-06 MED ORDER — HEPARIN SODIUM (PORCINE) 1000 UNIT/ML IJ SOLN
INTRAMUSCULAR | Status: AC
  Filled 2013-06-06: qty 1

## 2013-06-06 MED ORDER — RAMIPRIL 5 MG PO CAPS
5.0000 mg | ORAL_CAPSULE | Freq: Every day | ORAL | Status: DC
  Administered 2013-06-07: 10:00:00 5 mg via ORAL
  Filled 2013-06-06: qty 1

## 2013-06-06 MED ORDER — LIDOCAINE HCL (PF) 1 % IJ SOLN
INTRAMUSCULAR | Status: AC
  Filled 2013-06-06: qty 30

## 2013-06-06 MED ORDER — ACETAMINOPHEN 325 MG PO TABS
650.0000 mg | ORAL_TABLET | ORAL | Status: DC | PRN
  Administered 2013-06-06: 650 mg via ORAL
  Filled 2013-06-06: qty 2

## 2013-06-06 MED ORDER — SODIUM CHLORIDE 0.9 % IV SOLN: 250.0000 mL | INTRAVENOUS | Status: DC | PRN

## 2013-06-06 MED ORDER — RAMIPRIL 5 MG PO CAPS: 5.0000 mg | ORAL_CAPSULE | Freq: Every day | ORAL | Status: DC

## 2013-06-06 MED ORDER — SODIUM CHLORIDE 0.9 % IJ SOLN
3.0000 mL | Freq: Two times a day (BID) | INTRAMUSCULAR | Status: DC
Start: 2013-06-06 — End: 2013-06-06

## 2013-06-06 MED ORDER — NITROGLYCERIN 0.2 MG/ML ON CALL CATH LAB
INTRAVENOUS | Status: AC
  Filled 2013-06-06: qty 1

## 2013-06-06 MED ORDER — HEPARIN (PORCINE) IN NACL 2-0.9 UNIT/ML-% IJ SOLN
INTRAMUSCULAR | Status: AC
  Filled 2013-06-06: qty 1500

## 2013-06-06 MED ORDER — SODIUM CHLORIDE 0.9 % IJ SOLN: 3.0000 mL | INTRAMUSCULAR | Status: DC | PRN

## 2013-06-06 MED ORDER — GUAIFENESIN 100 MG/5ML PO SYRP
200.0000 mg | ORAL_SOLUTION | ORAL | Status: DC | PRN
  Administered 2013-06-06 (×2): 200 mg via ORAL
  Filled 2013-06-06 (×2): qty 10

## 2013-06-06 NOTE — Progress Notes (Signed)
Utilization Review Completed Trystin Terhune J. Santa Abdelrahman, RN, BSN, NCM [phone]  

## 2013-06-06 NOTE — H&P (View-Only) (Signed)
Patient Name: [name] Date of Encounter: 06/06/2013     Principal Problem:   Unstable angina Active Problems:   HYPERLIPIDEMIA-MIXED   HYPERTENSION, BENIGN   CAROTID ARTERY DISEASE   S/P CABG (coronary artery bypass graft) 1988   Syncope    SUBJECTIVE  No further chest pain. Rested well last night.. Rhythm is NSR with first degree AV block and occasional PACs. Awaiting cath.Troponins normal. Family states normal BP at home is in range of 110/60.  Syncope yesterday occurred after standing up after his second NTG.   CURRENT MEDS . [START ON 06/07/2013] aspirin EC  81 mg Oral QHS  . ezetimibe-simvastatin  1 tablet Oral Daily  . fenofibrate  160 mg Oral Daily  . metoprolol  50 mg Oral BID  . niacin  1,000 mg Oral QHS  . omega-3 acid ethyl esters  2 g Oral BID  . ramipril  10 mg Oral Daily  . sodium chloride  3 mL Intravenous Q12H  . tamsulosin  0.8 mg Oral QPC supper    OBJECTIVE  Filed Vitals:   06/06/13 0741 06/06/13 0800 06/06/13 0900 06/06/13 0946  BP: 118/74 108/55 122/67 122/67  Pulse: 67 62 69 64  Temp: 97.4 F (36.3 C)     TempSrc: Oral     Resp: 25 24 25    Height:      Weight:      SpO2: 96% 95% 96%     Intake/Output Summary (Last 24 hours) at 06/06/13 1004 Last data filed at 06/06/13 0900  Gross per 24 hour  Intake    268 ml  Output    400 ml  Net   -132 ml   Filed Weights   06/05/13 1239 06/05/13 2055 06/06/13 0426  Weight: 171 lb (77.565 kg) 164 lb 7.4 oz (74.6 kg) 164 lb 7.4 oz (74.6 kg)    PHYSICAL EXAM  General: Pleasant, NAD. Neuro: Alert and oriented X 3. Moves all extremities spontaneously. Psych: Normal affect. HEENT:  Normal  Neck: Supple without bruits or JVD. Lungs:  Resp regular and unlabored, CTA. Heart: RRR no s3, s4, or murmurs. Abdomen: Soft, non-tender, non-distended, BS + x 4.  Extremities: No clubbing, cyanosis or edema. DP/PT/Radials 2+ and equal bilaterally.  Accessory Clinical Findings  CBC  Recent  Labs  06/05/13 1243  WBC 6.4  HGB 14.3  HCT 40.9  MCV 91.9  PLT 379*   Basic Metabolic Panel  Recent Labs  06/05/13 1243  NA 139  K 4.4  CL 104  CO2 22  GLUCOSE 129*  BUN 22  CREATININE 1.29  CALCIUM 9.2   Liver Function Tests No results found for this basename: AST, ALT, ALKPHOS, BILITOT, PROT, ALBUMIN,  in the last 72 hours No results found for this basename: LIPASE, AMYLASE,  in the last 72 hours Cardiac Enzymes  Recent Labs  06/05/13 1243 06/05/13 2147 06/06/13 0310  TROPONINI <0.30 <0.30 <0.30   BNP No components found with this basename: POCBNP,  D-Dimer No results found for this basename: DDIMER,  in the last 72 hours Hemoglobin A1C No results found for this basename: HGBA1C,  in the last 72 hours Fasting Lipid Panel No results found for this basename: CHOL, HDL, LDLCALC, TRIG, CHOLHDL, LDLDIRECT,  in the last 72 hours Thyroid Function Tests No results found for this basename: TSH, T4TOTAL, FREET3, T3FREE, THYROIDAB,  in the last 72 hours  TELE  NSR with PACs  ECG  NSR with PACs. No acute ischemic changes.  Radiology/Studies  Dg Chest Port 1 View  06/05/2013   CLINICAL DATA:  Chest pain, near syncope  EXAM: PORTABLE CHEST - 1 VIEW  COMPARISON:  Chest x-ray of 05/15/2013  FINDINGS: No active infiltrate or effusion is seen. The lungs remain slightly hyper aerated. Cardiomegaly is stable. Median sternotomy sutures are noted from prior CABG.  IMPRESSION: Stable cardiomegaly.  No active lung disease.   Electronically Signed   By: Ivar Drape M.D.   On: 06/05/2013 13:20    ASSESSMENT AND PLAN 1. Unstable angina. S/P CABG. 2. Syncope probably orthostatic after SL NTG  Plan: Cath today. Will reduce his ramipril to 5 mg daily to allow higher baseline BP.   Signed, Darlin Coco MD

## 2013-06-06 NOTE — CV Procedure (Signed)
    Cardiac Catheterization Procedure Note  Name: [name] MRN: [id number] DOB: 1928/05/27  Procedure: Left Heart Cath, Selective Coronary Angiography, LV angiography, LIMA angiography, SVG angiography  Indication: Chest pain with syncope, concerning for USAP. 80 year-old male with known CAD s/p CABG.   Procedural Details: The left wrist was prepped, draped, and anesthetized with 1% lidocaine. Using the modified Seldinger technique, a 5 French sheath was introduced into the left radial artery. 3 mg of verapamil was administered through the sheath, weight-based unfractionated heparin was administered intravenously. Standard Judkins catheters were used for selective coronary angiography and left ventriculography. A LIMA catheter was used to inject the LIMA. An a.l. 2 catheter was used to inject the saphenous vein graft obtuse marginal. A multipurpose catheter was used to inject the vein graft to right PDA. Catheter exchanges were performed over an exchange length guidewire. There were no immediate procedural complications. A TR band was used for radial hemostasis at the completion of the procedure.  The patient was transferred to the post catheterization recovery area for further monitoring.  Procedural Findings: Hemodynamics: AO 110/14 LV 108/53 with a mean of 75  Coronary angiography: Coronary dominance: right  Left mainstem: The left main is mildly calcified. There is no significant obstructive disease.  Left anterior descending (LAD): The proximal LAD is patent. The mid LAD is occluded. The vessel fills from the LIMA. The first diagonal has a 90% stenosis.  Left circumflex (LCx): The circumflex is totally occluded in the proximal aspect  Right coronary artery (RCA): The RCA is totally occluded in the proximal aspect.  LIMA to LAD: Widely patent throughout. There is no obstructive disease. The LAD is a large caliber vessel that wraps around the left ventricular apex. There is mild  diffuse stenosis noted.  Saphenous vein graft to obtuse marginal: Widely patent with no obstructive disease. The native OM is widely patent with no obstructive disease.  Saphenous vein graft to right PDA: The graft is patent throughout. There is 40-50% in-stent restenosis that is not apparent flow obstructive. The PDA is patent throughout and it is a large caliber vessel.  Left ventriculography: Left ventricular systolic function is normal, LVEF is estimated at 55-65%, there is no significant mitral regurgitation   Final Conclusions:   1. Severe native three-vessel coronary artery disease with total occlusion of all native coronary arteries and severe stenosis in a small caliber first diagonal branch.  2. Status post CABG with continued patency of the LIMA to LAD, saphenous vein graft to OM, and saphenous vein graft to PDA.  3. Normal LV systolic function.  Recommendations: Continued medical therapy. He does have a tight lesion in a small diagonal branch and I would recommend medical therapy for this. PCI could be considered if he has refractory anginal symptoms.  [name] 06/06/2013, 12:51 PM

## 2013-06-06 NOTE — Progress Notes (Signed)
Patient Name: [name] Date of Encounter: 06/06/2013     Principal Problem:   Unstable angina Active Problems:   HYPERLIPIDEMIA-MIXED   HYPERTENSION, BENIGN   CAROTID ARTERY DISEASE   S/P CABG (coronary artery bypass graft) 1988   Syncope    SUBJECTIVE  No further chest pain. Rested well last night.. Rhythm is NSR with first degree AV block and occasional PACs. Awaiting cath.Troponins normal. Family states normal BP at home is in range of 110/60.  Syncope yesterday occurred after standing up after his second NTG.   CURRENT MEDS . [START ON 06/07/2013] aspirin EC  81 mg Oral QHS  . ezetimibe-simvastatin  1 tablet Oral Daily  . fenofibrate  160 mg Oral Daily  . metoprolol  50 mg Oral BID  . niacin  1,000 mg Oral QHS  . omega-3 acid ethyl esters  2 g Oral BID  . ramipril  10 mg Oral Daily  . sodium chloride  3 mL Intravenous Q12H  . tamsulosin  0.8 mg Oral QPC supper    OBJECTIVE  Filed Vitals:   06/06/13 0741 06/06/13 0800 06/06/13 0900 06/06/13 0946  BP: 118/74 108/55 122/67 122/67  Pulse: 67 62 69 64  Temp: 97.4 F (36.3 C)     TempSrc: Oral     Resp: 25 24 25    Height:      Weight:      SpO2: 96% 95% 96%     Intake/Output Summary (Last 24 hours) at 06/06/13 1004 Last data filed at 06/06/13 0900  Gross per 24 hour  Intake    268 ml  Output    400 ml  Net   -132 ml   Filed Weights   06/05/13 1239 06/05/13 2055 06/06/13 0426  Weight: 171 lb (77.565 kg) 164 lb 7.4 oz (74.6 kg) 164 lb 7.4 oz (74.6 kg)    PHYSICAL EXAM  General: Pleasant, NAD. Neuro: Alert and oriented X 3. Moves all extremities spontaneously. Psych: Normal affect. HEENT:  Normal  Neck: Supple without bruits or JVD. Lungs:  Resp regular and unlabored, CTA. Heart: RRR no s3, s4, or murmurs. Abdomen: Soft, non-tender, non-distended, BS + x 4.  Extremities: No clubbing, cyanosis or edema. DP/PT/Radials 2+ and equal bilaterally.  Accessory Clinical Findings  CBC  Recent  Labs  06/05/13 1243  WBC 6.4  HGB 14.3  HCT 40.9  MCV 91.9  PLT 379*   Basic Metabolic Panel  Recent Labs  06/05/13 1243  NA 139  K 4.4  CL 104  CO2 22  GLUCOSE 129*  BUN 22  CREATININE 1.29  CALCIUM 9.2   Liver Function Tests No results found for this basename: AST, ALT, ALKPHOS, BILITOT, PROT, ALBUMIN,  in the last 72 hours No results found for this basename: LIPASE, AMYLASE,  in the last 72 hours Cardiac Enzymes  Recent Labs  06/05/13 1243 06/05/13 2147 06/06/13 0310  TROPONINI <0.30 <0.30 <0.30   BNP No components found with this basename: POCBNP,  D-Dimer No results found for this basename: DDIMER,  in the last 72 hours Hemoglobin A1C No results found for this basename: HGBA1C,  in the last 72 hours Fasting Lipid Panel No results found for this basename: CHOL, HDL, LDLCALC, TRIG, CHOLHDL, LDLDIRECT,  in the last 72 hours Thyroid Function Tests No results found for this basename: TSH, T4TOTAL, FREET3, T3FREE, THYROIDAB,  in the last 72 hours  TELE  NSR with PACs  ECG  NSR with PACs. No acute ischemic changes.  Radiology/Studies  Dg Chest Port 1 View  06/05/2013   CLINICAL DATA:  Chest pain, near syncope  EXAM: PORTABLE CHEST - 1 VIEW  COMPARISON:  Chest x-ray of 05/15/2013  FINDINGS: No active infiltrate or effusion is seen. The lungs remain slightly hyper aerated. Cardiomegaly is stable. Median sternotomy sutures are noted from prior CABG.  IMPRESSION: Stable cardiomegaly.  No active lung disease.   Electronically Signed   By: Paul  Barry M.D.   On: 06/05/2013 13:20    ASSESSMENT AND PLAN 1. Unstable angina. S/P CABG. 2. Syncope probably orthostatic after SL NTG  Plan: Cath today. Will reduce his ramipril to 5 mg daily to allow higher baseline BP.   Signed, Fran Neiswonger MD 

## 2013-06-06 NOTE — Progress Notes (Signed)
ANTICOAGULATION CONSULT NOTE - Follow Up Consult  Pharmacy Consult for Heparin  Indication: chest pain/ACS  Allergies  Allergen Reactions  . Penicillins     REACTION: hives    Patient Measurements: Height: 5\' 11"  (180.3 cm) Weight: 164 lb 7.4 oz (74.6 kg) IBW/kg (Calculated) : 75.3  Vital Signs: Temp: 98.4 F (36.9 C) (03/18 0021) Temp src: Oral (03/18 0021) BP: 120/70 mmHg (03/18 0021) Pulse Rate: 59 (03/18 0021)  Labs:  Recent Labs  06/05/13 1243 06/05/13 2147 06/06/13 0310  HGB 14.3  --   --   HCT 40.9  --   --   PLT 125*  --   --   LABPROT 13.5  --   --   INR 1.05  --   --   HEPARINUNFRC  --   --  0.16*  CREATININE 1.29  --   --   TROPONINI <0.30 <0.30 <0.30    Estimated Creatinine Clearance: 46.6 ml/min (by C-G formula based on Cr of 1.29).   Medications:  Heparin 1000 units/hr  Assessment: 80 y/o M on heparin for CP/syncope. HL is 0.16. Other labs as above. No issues per RN.   Goal of Therapy:  Heparin level 0.3-0.7 units/ml Monitor platelets by anticoagulation protocol: Yes   Plan:  -Increase heparin to 1200 units/hr -1300 HL -Daily CBC/HL -Monitor for bleeding  Per, [name] 06/06/2013,4:15 AM

## 2013-06-06 NOTE — Progress Notes (Signed)
TR BAND REMOVAL  LOCATION:  left radial  DEFLATED PER PROTOCOL:  yes  TIME BAND OFF / DRESSING APPLIED:   1600   SITE UPON ARRIVAL:   Level 0  SITE AFTER BAND REMOVAL:  Level 0  REVERSE ALLEN'S TEST:    positive  CIRCULATION SENSATION AND MOVEMENT:  Within Normal Limits  yes  COMMENTS:    

## 2013-06-06 NOTE — Interval H&P Note (Signed)
History and Physical Interval Note:  06/06/2013 12:11 PM  [name]  has presented today for surgery, with the diagnosis of c/p  The various methods of treatment have been discussed with the patient and family. After consideration of risks, benefits and other options for treatment, the patient has consented to  Procedure(s): LEFT HEART CATHETERIZATION WITH CORONARY/GRAFT ANGIOGRAM (N/A) as a surgical intervention .  The patient's history has been reviewed, patient examined, no change in status, stable for surgery.  I have reviewed the patient's chart and labs.  Questions were answered to the patient's satisfaction.    Cath Lab Visit (complete for each Cath Lab visit)  Clinical Evaluation Leading to the Procedure:   ACS: yes  Non-ACS:    Anginal Classification: CCS IV  Anti-ischemic medical therapy: Minimal Therapy (1 class of medications)  Non-Invasive Test Results: No non-invasive testing performed  Prior CABG: Previous CABG       [name]

## 2013-06-07 ENCOUNTER — Encounter (HOSPITAL_COMMUNITY): Payer: Self-pay | Admitting: Nurse Practitioner

## 2013-06-07 DIAGNOSIS — I200 Unstable angina: Secondary | ICD-10-CM | POA: Diagnosis not present

## 2013-06-07 DIAGNOSIS — I369 Nonrheumatic tricuspid valve disorder, unspecified: Secondary | ICD-10-CM

## 2013-06-07 DIAGNOSIS — I2510 Atherosclerotic heart disease of native coronary artery without angina pectoris: Secondary | ICD-10-CM

## 2013-06-07 LAB — CBC
HCT: 41.2 % (ref 39.0–52.0)
Hemoglobin: 14.3 g/dL (ref 13.0–17.0)
MCH: 32.1 pg (ref 26.0–34.0)
MCHC: 34.7 g/dL (ref 30.0–36.0)
MCV: 92.4 fL (ref 78.0–100.0)
PLATELETS: 120 10*3/uL — AB (ref 150–400)
RBC: 4.46 MIL/uL (ref 4.22–5.81)
RDW: 13.7 % (ref 11.5–15.5)
WBC: 7 10*3/uL (ref 4.0–10.5)

## 2013-06-07 NOTE — Discharge Instructions (Signed)

## 2013-06-07 NOTE — Progress Notes (Signed)
Echocardiogram 2D Echocardiogram has been performed.  [name], [name] 06/07/2013, 10:24 AM

## 2013-06-07 NOTE — Discharge Summary (Signed)
Discharge Summary   Patient ID: [name],  MRN: [id number], DOB/AGE: 78/24/1930 78 y.o.  Admit date: 06/05/2013 Discharge date: 06/07/2013  Primary Care Provider: No primary provider on file. Primary Cardiologist: B. Crenshaw, MD   Discharge Diagnoses Principal Problem:   Unstable angina  **S/P catheterization this admission revealing stable coronary anatomy.  Active Problems:   CAD (coronary artery disease)   S/P CABG (coronary artery bypass graft) 1988   Syncope   HYPERLIPIDEMIA-MIXED   HYPERTENSION, BENIGN   CAROTID ARTERY DISEASE  Allergies Allergies  Allergen Reactions  . Penicillins     REACTION: hives   Procedures  Cardiac Catheterization 3.18.2015  Procedural Findings: Hemodynamics: AO 110/14 LV 108/53 with a mean of 75  Coronary angiography: Coronary dominance: right  Left mainstem: The left main is mildly calcified. There is no significant obstructive disease. Left anterior descending (LAD): The proximal LAD is patent. The mid LAD is occluded. The vessel fills from the LIMA. The first diagonal has a 90% stenosis. Left circumflex (LCx): The circumflex is totally occluded in the proximal aspect Right coronary artery (RCA): The RCA is totally occluded in the proximal aspect. LIMA to LAD: Widely patent throughout. There is no obstructive disease. The LAD is a large caliber vessel that wraps around the left ventricular apex. There is mild diffuse stenosis noted. Saphenous vein graft to obtuse marginal: Widely patent with no obstructive disease. The native OM is widely patent with no obstructive disease. Saphenous vein graft to right PDA: The graft is patent throughout. There is 40-50% in-stent restenosis that is not apparent flow obstructive. The PDA is patent throughout and it is a large caliber vessel. Left ventriculography: Left ventricular systolic function is normal, LVEF is estimated at 55-65%, there is no significant mitral regurgitation   Final  Conclusions:   1. Severe native three-vessel coronary artery disease with total occlusion of all native coronary arteries and severe stenosis in a small caliber first diagonal branch. 2. Status post CABG with continued patency of the LIMA to LAD, saphenous vein graft to OM, and saphenous vein graft to PDA. 3. Normal LV systolic function. _____________   History of Present Illness  80 year old male with prior history of coronary artery disease status post coronary artery bypass grafting in 1988 with inferior myocardial infarction in 1999 requiring stenting of the vein graft to the right PDA. He also has a history of hypertension and hyperlipidemia. He was in his usual state of health until morning of admission when he developed substernal chest discomfort while lying down. He took a sublingual nitroglycerin with properly of approximately 30-40 minutes later he had recurrent discomfort and again took nitroglycerin. After taking the nitroglycerin, he got up to use the bathroom but became lightheaded and lost consciousness. He was able to be assisted back to his seat by his wife. She witnessed him to become pale with apparent respiratory arrest and CPR was briefly carried out for 10-30 seconds prior to him becoming alert. EMS was called and the patient was taken to the [facility] where ECG was nonacute and initial troponin was normal. He was admitted for further evaluation.  Hospital Course  Patient ruled out for myocardial infarction. Given presenting symptoms, decision was made to pursue diagnostic catheterization which was performed on March 18 revealed severe native coronary artery disease with occlusions of all major epicardial vessels. He had 3 of 3 patent grafts with patency of the previously placed stent in the vein graft to the PDA. EF was 55-65%. In the  absence of a culprit for his symptoms, continue medical therapy was recommended. He was also advised not to use a sublingual nitroglycerin as he is  now had syncope after nitroglycerin usage on 2 occasions. Post catheterization, he has been ambulating without recurrent symptoms or limitations. He will be discharged home today in good condition.  Discharge Vitals Blood pressure 100/57, pulse 68, temperature 97.9 F (36.6 C), temperature source Oral, resp. rate 16, height 5\' 11"  (1.803 m), weight 165 lb 9.1 oz (75.1 kg), SpO2 95.00%.  Filed Weights   06/06/13 0426 06/07/13 0033 06/07/13 0500  Weight: 164 lb 7.4 oz (74.6 kg) 165 lb 9.1 oz (75.1 kg) 165 lb 9.1 oz (75.1 kg)   Labs  CBC  Recent Labs  06/05/13 1243 06/07/13 0344  WBC 6.4 7.0  HGB 14.3 14.3  HCT 40.9 41.2  MCV 91.9 92.4  PLT 125* 025*   Basic Metabolic Panel  Recent Labs  06/05/13 1243  NA 139  K 4.4  CL 104  CO2 22  GLUCOSE 129*  BUN 22  CREATININE 1.29  CALCIUM 9.2   Cardiac Enzymes  Recent Labs  06/05/13 2147 06/06/13 0310 06/06/13 0915  TROPONINI <0.30 <0.30 <0.30   Thyroid Function Tests  Recent Labs  06/05/13 2147  TSH 0.668   Disposition  Pt is being discharged home today in good condition.  Follow-up Plans & Appointments  Follow-up Information   Follow up with Kirk Ruths, MD On 07/05/2013. (4:00 PM)    Specialty:  Cardiology   Contact information:   [address] [place] [address] [phone]      Discharge Medications    Medication List    STOP taking these medications       nitroGLYCERIN 0.4 MG SL tablet  Commonly known as:  NITROSTAT      TAKE these medications       albuterol (2.5 MG/3ML) 0.083% nebulizer solution  Commonly known as:  PROVENTIL  Take 2.5 mg by nebulization every 6 (six) hours as needed.     aspirin EC 81 MG tablet  Take 81 mg by mouth at bedtime.     beclomethasone 80 MCG/ACT inhaler  Commonly known as:  QVAR  Inhale 2 puffs into the lungs as needed (for shortness of breath).     ezetimibe-simvastatin 10-40 MG per tablet  Commonly known as:  VYTORIN  Take 1  tablet by mouth daily.     fenofibrate 160 MG tablet  Take 160 mg by mouth daily.     fish oil-omega-3 fatty acids 1000 MG capsule  Take 4 g by mouth 2 (two) times daily.     ipratropium 0.02 % nebulizer solution  Commonly known as:  ATROVENT  Take 500 mcg by nebulization every 6 (six) hours as needed for shortness of breath.     metoprolol 50 MG tablet  Commonly known as:  LOPRESSOR  Take 50 mg by mouth 2 (two) times daily.     niacin 500 MG tablet  Take 1,000 mg by mouth at bedtime.     ramipril 10 MG capsule  Commonly known as:  ALTACE  Take 10 mg by mouth daily.     ranitidine 150 MG tablet  Commonly known as:  ZANTAC  Take 150 mg by mouth 2 (two) times daily.     tamsulosin 0.4 MG Caps capsule  Commonly known as:  FLOMAX  Take 0.8 mg by mouth daily after supper.       Outstanding Labs/Studies  None  Duration of Discharge Encounter   Greater than 30 minutes including physician time.  Signed, Murray Hodgkins NP 06/07/2013, 9:46 AM

## 2013-06-07 NOTE — Progress Notes (Signed)
    Subjective:  No chest pain or dyspnea overnight. No complaints this morning.  Objective:  Vital Signs in the last 24 hours: Temp:  [97.4 F (36.3 C)-98.4 F (36.9 C)] 97.9 F (36.6 C) (03/19 0755) Pulse Rate:  [59-73] 68 (03/19 0755) Resp:  [12-23] 16 (03/19 0755) BP: (95-147)/(45-77) 100/57 mmHg (03/19 0755) SpO2:  [94 %-99 %] 95 % (03/19 0755) Weight:  [165 lb 9.1 oz (75.1 kg)] 165 lb 9.1 oz (75.1 kg) (03/19 0500)  Intake/Output from previous day: 03/18 0701 - 03/19 0700 In: 1022.1 [P.O.:300; I.V.:722.1] Out: 400 [Urine:400]  Physical Exam: Pt is alert and oriented, elderly male in NAD HEENT: normal Neck: JVP - normal Lungs: CTA bilaterally CV: RRR without murmur or gallop Abd: soft, NT, Positive BS, no hepatomegaly Ext: no C/C/E, distal pulses intact and equal, left radial site clear Skin: warm/dry no rash   Lab Results:  Recent Labs  06/05/13 1243 06/07/13 0344  WBC 6.4 7.0  HGB 14.3 14.3  PLT 125* 120*    Recent Labs  06/05/13 1243  NA 139  K 4.4  CL 104  CO2 22  GLUCOSE 129*  BUN 22  CREATININE 1.29    Recent Labs  06/06/13 0310 06/06/13 0915  TROPONINI <0.30 <0.30    Tele: Sinus rhythm without significant arrhythmia.  Assessment/Plan:  1. Unstable angina. Cardiac catheterization yesterday demonstrated stable coronary artery disease with continued patency of his bypass grafts. He does have some small vessel obstructive CAD unit will be treated medically.  2. Syncope. This was related to sublingual nitroglycerin. This is the second time this has occurred. I advised him not to use nitroglycerin anymore, even if chest pain were to occur again. Advised him to sit down and rest if chest pain occurs, and activate EMS if symptoms don't resolve within 10 minutes.  Other problems including hypertension and hyperlipidemia are stable. He will remain on his outpatient medical program and followup with Dr. Stanford Breed.  [name],  M.D. 06/07/2013, 9:09 AM

## 2013-07-05 ENCOUNTER — Encounter: Payer: Self-pay | Admitting: Cardiology

## 2013-07-05 ENCOUNTER — Ambulatory Visit (INDEPENDENT_AMBULATORY_CARE_PROVIDER_SITE_OTHER): Payer: Medicare Other | Admitting: Cardiology

## 2013-07-05 VITALS — BP 115/62 | HR 67 | Ht 71.0 in | Wt 168.8 lb

## 2013-07-05 DIAGNOSIS — I2510 Atherosclerotic heart disease of native coronary artery without angina pectoris: Secondary | ICD-10-CM | POA: Diagnosis not present

## 2013-07-05 DIAGNOSIS — E785 Hyperlipidemia, unspecified: Secondary | ICD-10-CM | POA: Diagnosis not present

## 2013-07-05 DIAGNOSIS — I10 Essential (primary) hypertension: Secondary | ICD-10-CM

## 2013-07-05 DIAGNOSIS — I6529 Occlusion and stenosis of unspecified carotid artery: Secondary | ICD-10-CM

## 2013-07-05 NOTE — Assessment & Plan Note (Signed)
Continue statin. 

## 2013-07-05 NOTE — Patient Instructions (Signed)
Your physician wants you to follow-up in: 6 MONTHS WITH DR CRENSHAW You will receive a reminder letter in the mail two months in advance. If you don't receive a letter, please call our office to schedule the follow-up appointment.  

## 2013-07-05 NOTE — Assessment & Plan Note (Signed)
Blood pressure controlled. Continue present medications. 

## 2013-07-05 NOTE — Assessment & Plan Note (Addendum)
Continue aspirin and statin. We will avoid sublingual nitroglycerin given history of recurrent syncope with this medication.

## 2013-07-05 NOTE — Assessment & Plan Note (Signed)
Continue aspirin and statin. 

## 2013-07-05 NOTE — Progress Notes (Signed)
HPI: FU coronary artery disease. He is status post bypass grafting in 1988 and 1999. He had an inferior infarct and underwent stenting of the vein graft to the right coronary artery. Abdominal CT in 2000 showed no aneurysm.  Carotid Dopplers in Dec 2013 showed 0-39% stenosis bilaterally. ABIs in November of 2011 were normal. Cardiac catheterization in March of 2015 showed severe three-vessel coronary disease with occlusion of all native vessels. The LIMA to the LAD, saphenous vein graft to the acute marginal and saphenous vein graft to the PDA were patent. LV function normal. There was note of a tight lesion in a small diagonal branch and PCI could be considered for refractory symptoms. Echocardiogram in March 2015 showed normal LV function, grade 1 diastolic dysfunction and mild left atrial enlargement. Admitted in March of 2015 with chest pain and ruled out. Catheterization and echo as above. Note he did have a syncopal episode after taking nitroglycerin prior to the admission. Since discharge, He does have some dyspnea but no exertional chest pain. No pedal edema or syncope.   Current Outpatient Prescriptions  Medication Sig Dispense Refill  . albuterol (PROVENTIL) (2.5 MG/3ML) 0.083% nebulizer solution Take 2.5 mg by nebulization every 6 (six) hours as needed.        Marland Kitchen aspirin EC 81 MG tablet Take 81 mg by mouth at bedtime.      . beclomethasone (QVAR) 80 MCG/ACT inhaler Inhale 2 puffs into the lungs as needed (for shortness of breath).       . ezetimibe-simvastatin (VYTORIN) 10-40 MG per tablet Take 1 tablet by mouth daily.      . fenofibrate 160 MG tablet Take 160 mg by mouth daily.      . fish oil-omega-3 fatty acids 1000 MG capsule Take 4 g by mouth 2 (two) times daily.       Marland Kitchen ipratropium (ATROVENT) 0.02 % nebulizer solution Take 500 mcg by nebulization every 6 (six) hours as needed for shortness of breath.       . metoprolol (LOPRESSOR) 50 MG tablet Take 50 mg by mouth 2 (two) times  daily.      . niacin 500 MG tablet Take 1,000 mg by mouth at bedtime.       . ramipril (ALTACE) 10 MG capsule Take 10 mg by mouth daily.      . ranitidine (ZANTAC) 150 MG tablet Take 150 mg by mouth 2 (two) times daily.        . tamsulosin (FLOMAX) 0.4 MG CAPS capsule Take 0.8 mg by mouth daily after supper.       No current facility-administered medications for this visit.     Past Medical History  Diagnosis Date  . Coronary artery disease     a. 1988 s/p CABG x3 with LIMA->LAD, VG->OM, VG->RCA;  b. 1999 s/p inf MI and PCI/stenting of VG->PDA;  c. 05/2013 Cath: LM nl, LAD 12m, D1 90, LCX 100p, RCA 100p, LIMA->LAD nl, VG->OM nl, VG->RPDA 40-50 isr, EF 55-65%->Med Rx.  Marland Kitchen Hypertension   . Hyperlipidemia   . Hypertriglyceridemia   . Gastroesophageal reflux disease with ulceration   . Peptic ulcer disease   . Postoperative atrial fibrillation     history of   . Back pain   . Leg weakness     Past Surgical History  Procedure Laterality Date  . Coronary artery bypass graft  1988    x3in 1988 with left nternal mammary artery to left anterior descending, vein graft to the  obtuse marginal, vein graft to the right coronary artery  . Coronary angioplasty with stent placement  11/1997    status post percutaneous coronary intervention and stenting with bare metal stent of the vein graft to the right coronary artery.  . Cardiac catheterization  06/2005     EF 50%. severe native CAD. SVG-RCA 50-60 othewise all grafts patent    History   Social History  . Marital Status: Married    Spouse Name: N/A    Number of Children: N/A  . Years of Education: N/A   Occupational History  . Not on file.   Social History Main Topics  . Smoking status: Former Smoker    Quit date: 10/14/1961  . Smokeless tobacco: Not on file     Comment: Quit in 1963  . Alcohol Use: No  . Drug Use: Not on file  . Sexual Activity: Not on file   Other Topics Concern  . Not on file   Social History Narrative  .  No narrative on file    ROS: no fevers or chills, productive cough, hemoptysis, dysphasia, odynophagia, melena, hematochezia, dysuria, hematuria, rash, seizure activity, orthopnea, PND, pedal edema, claudication. Remaining systems are negative.  Physical Exam: Well-developed well-nourished in no acute distress.  Skin is warm and dry.  HEENT is normal.  Neck is supple.  Chest is clear to auscultation with normal expansion.  Cardiovascular exam is regular rate and rhythm.  Abdominal exam nontender or distended. No masses palpated. Extremities show no edema. neuro grossly intact

## 2013-07-10 DIAGNOSIS — E785 Hyperlipidemia, unspecified: Secondary | ICD-10-CM | POA: Diagnosis not present

## 2013-07-10 DIAGNOSIS — I10 Essential (primary) hypertension: Secondary | ICD-10-CM | POA: Diagnosis not present

## 2013-07-10 DIAGNOSIS — F0391 Unspecified dementia with behavioral disturbance: Secondary | ICD-10-CM | POA: Diagnosis not present

## 2013-07-10 DIAGNOSIS — J449 Chronic obstructive pulmonary disease, unspecified: Secondary | ICD-10-CM | POA: Diagnosis not present

## 2013-07-10 DIAGNOSIS — F03918 Unspecified dementia, unspecified severity, with other behavioral disturbance: Secondary | ICD-10-CM | POA: Diagnosis not present

## 2013-07-10 DIAGNOSIS — Z23 Encounter for immunization: Secondary | ICD-10-CM | POA: Diagnosis not present

## 2013-10-01 DIAGNOSIS — IMO0002 Reserved for concepts with insufficient information to code with codable children: Secondary | ICD-10-CM | POA: Diagnosis not present

## 2013-10-01 DIAGNOSIS — M109 Gout, unspecified: Secondary | ICD-10-CM | POA: Diagnosis not present

## 2013-11-12 DIAGNOSIS — I10 Essential (primary) hypertension: Secondary | ICD-10-CM | POA: Diagnosis not present

## 2013-11-12 DIAGNOSIS — E785 Hyperlipidemia, unspecified: Secondary | ICD-10-CM | POA: Diagnosis not present

## 2013-11-12 DIAGNOSIS — IMO0002 Reserved for concepts with insufficient information to code with codable children: Secondary | ICD-10-CM | POA: Diagnosis not present

## 2013-11-12 DIAGNOSIS — F03918 Unspecified dementia, unspecified severity, with other behavioral disturbance: Secondary | ICD-10-CM | POA: Diagnosis not present

## 2013-11-12 DIAGNOSIS — I2510 Atherosclerotic heart disease of native coronary artery without angina pectoris: Secondary | ICD-10-CM | POA: Diagnosis not present

## 2013-11-12 DIAGNOSIS — N183 Chronic kidney disease, stage 3 unspecified: Secondary | ICD-10-CM | POA: Diagnosis not present

## 2013-11-12 DIAGNOSIS — F0391 Unspecified dementia with behavioral disturbance: Secondary | ICD-10-CM | POA: Diagnosis not present

## 2014-02-28 ENCOUNTER — Encounter (HOSPITAL_COMMUNITY): Payer: Self-pay | Admitting: Cardiovascular Disease

## 2014-03-13 DIAGNOSIS — Z23 Encounter for immunization: Secondary | ICD-10-CM | POA: Diagnosis not present

## 2014-03-13 DIAGNOSIS — E785 Hyperlipidemia, unspecified: Secondary | ICD-10-CM | POA: Diagnosis not present

## 2014-03-13 DIAGNOSIS — N401 Enlarged prostate with lower urinary tract symptoms: Secondary | ICD-10-CM | POA: Diagnosis not present

## 2014-03-13 DIAGNOSIS — Z6824 Body mass index (BMI) 24.0-24.9, adult: Secondary | ICD-10-CM | POA: Diagnosis not present

## 2014-03-13 DIAGNOSIS — I10 Essential (primary) hypertension: Secondary | ICD-10-CM | POA: Diagnosis not present

## 2014-04-08 DIAGNOSIS — N390 Urinary tract infection, site not specified: Secondary | ICD-10-CM | POA: Diagnosis not present

## 2014-04-08 DIAGNOSIS — N320 Bladder-neck obstruction: Secondary | ICD-10-CM | POA: Diagnosis not present

## 2014-05-10 DIAGNOSIS — N390 Urinary tract infection, site not specified: Secondary | ICD-10-CM | POA: Diagnosis not present

## 2014-05-10 DIAGNOSIS — N320 Bladder-neck obstruction: Secondary | ICD-10-CM | POA: Diagnosis not present

## 2014-05-10 DIAGNOSIS — R351 Nocturia: Secondary | ICD-10-CM | POA: Diagnosis not present

## 2014-05-10 DIAGNOSIS — R350 Frequency of micturition: Secondary | ICD-10-CM | POA: Diagnosis not present

## 2014-06-10 DIAGNOSIS — R350 Frequency of micturition: Secondary | ICD-10-CM | POA: Diagnosis not present

## 2014-06-10 DIAGNOSIS — N320 Bladder-neck obstruction: Secondary | ICD-10-CM | POA: Diagnosis not present

## 2014-06-10 DIAGNOSIS — R351 Nocturia: Secondary | ICD-10-CM | POA: Diagnosis not present

## 2014-07-11 DIAGNOSIS — E785 Hyperlipidemia, unspecified: Secondary | ICD-10-CM | POA: Diagnosis not present

## 2014-07-11 DIAGNOSIS — F0391 Unspecified dementia with behavioral disturbance: Secondary | ICD-10-CM | POA: Diagnosis not present

## 2014-07-11 DIAGNOSIS — J449 Chronic obstructive pulmonary disease, unspecified: Secondary | ICD-10-CM | POA: Diagnosis not present

## 2014-07-11 DIAGNOSIS — I10 Essential (primary) hypertension: Secondary | ICD-10-CM | POA: Diagnosis not present

## 2014-07-11 DIAGNOSIS — N183 Chronic kidney disease, stage 3 (moderate): Secondary | ICD-10-CM | POA: Diagnosis not present

## 2014-08-26 DIAGNOSIS — R310 Gross hematuria: Secondary | ICD-10-CM | POA: Diagnosis not present

## 2014-08-28 DIAGNOSIS — N202 Calculus of kidney with calculus of ureter: Secondary | ICD-10-CM | POA: Diagnosis not present

## 2014-08-28 DIAGNOSIS — R310 Gross hematuria: Secondary | ICD-10-CM | POA: Diagnosis not present

## 2014-08-28 DIAGNOSIS — N210 Calculus in bladder: Secondary | ICD-10-CM | POA: Diagnosis not present

## 2014-09-10 ENCOUNTER — Encounter (HOSPITAL_COMMUNITY): Payer: Self-pay | Admitting: Emergency Medicine

## 2014-09-10 ENCOUNTER — Emergency Department (HOSPITAL_COMMUNITY)
Admission: EM | Admit: 2014-09-10 | Discharge: 2014-09-10 | Disposition: A | Discharge status: Home / Self Care | Payer: Medicare Other | Attending: Emergency Medicine | Admitting: Emergency Medicine

## 2014-09-10 DIAGNOSIS — R310 Gross hematuria: Secondary | ICD-10-CM | POA: Diagnosis not present

## 2014-09-10 DIAGNOSIS — Z880 Allergy status to penicillin: Secondary | ICD-10-CM | POA: Insufficient documentation

## 2014-09-10 DIAGNOSIS — Z951 Presence of aortocoronary bypass graft: Secondary | ICD-10-CM | POA: Diagnosis not present

## 2014-09-10 DIAGNOSIS — Z7982 Long term (current) use of aspirin: Secondary | ICD-10-CM | POA: Diagnosis not present

## 2014-09-10 DIAGNOSIS — K219 Gastro-esophageal reflux disease without esophagitis: Secondary | ICD-10-CM | POA: Insufficient documentation

## 2014-09-10 DIAGNOSIS — Z87891 Personal history of nicotine dependence: Secondary | ICD-10-CM | POA: Insufficient documentation

## 2014-09-10 DIAGNOSIS — N390 Urinary tract infection, site not specified: Secondary | ICD-10-CM | POA: Diagnosis not present

## 2014-09-10 DIAGNOSIS — E781 Pure hyperglyceridemia: Secondary | ICD-10-CM | POA: Insufficient documentation

## 2014-09-10 DIAGNOSIS — I959 Hypotension, unspecified: Secondary | ICD-10-CM | POA: Insufficient documentation

## 2014-09-10 DIAGNOSIS — Z8711 Personal history of peptic ulcer disease: Secondary | ICD-10-CM | POA: Diagnosis not present

## 2014-09-10 DIAGNOSIS — Z79899 Other long term (current) drug therapy: Secondary | ICD-10-CM | POA: Insufficient documentation

## 2014-09-10 DIAGNOSIS — N200 Calculus of kidney: Secondary | ICD-10-CM | POA: Diagnosis not present

## 2014-09-10 DIAGNOSIS — E785 Hyperlipidemia, unspecified: Secondary | ICD-10-CM | POA: Diagnosis not present

## 2014-09-10 DIAGNOSIS — I10 Essential (primary) hypertension: Secondary | ICD-10-CM | POA: Diagnosis not present

## 2014-09-10 DIAGNOSIS — E860 Dehydration: Secondary | ICD-10-CM

## 2014-09-10 DIAGNOSIS — N323 Diverticulum of bladder: Secondary | ICD-10-CM | POA: Diagnosis not present

## 2014-09-10 DIAGNOSIS — N320 Bladder-neck obstruction: Secondary | ICD-10-CM | POA: Diagnosis not present

## 2014-09-10 DIAGNOSIS — N210 Calculus in bladder: Secondary | ICD-10-CM | POA: Diagnosis not present

## 2014-09-10 LAB — BASIC METABOLIC PANEL
Anion gap: 8 (ref 5–15)
BUN: 32 mg/dL — ABNORMAL HIGH (ref 6–20)
CO2: 23 mmol/L (ref 22–32)
Calcium: 9.1 mg/dL (ref 8.9–10.3)
Chloride: 109 mmol/L (ref 101–111)
Creatinine, Ser: 1.92 mg/dL — ABNORMAL HIGH (ref 0.61–1.24)
GFR calc Af Amer: 35 mL/min — ABNORMAL LOW (ref >60)
GFR calc non Af Amer: 30 mL/min — ABNORMAL LOW (ref >60)
Glucose, Bld: 121 mg/dL — ABNORMAL HIGH (ref 65–99)
Potassium: 4.2 mmol/L (ref 3.5–5.1)
Sodium: 140 mmol/L (ref 135–145)

## 2014-09-10 LAB — CBC
HCT: 41.9 % (ref 39.0–52.0)
Hemoglobin: 13.9 g/dL (ref 13.0–17.0)
MCH: 32.2 pg (ref 26.0–34.0)
MCHC: 33.2 g/dL (ref 30.0–36.0)
MCV: 97 fL (ref 78.0–100.0)
Platelets: 124 10*3/uL — ABNORMAL LOW (ref 150–400)
RBC: 4.32 MIL/uL (ref 4.22–5.81)
RDW: 14 % (ref 11.5–15.5)
WBC: 4 10*3/uL (ref 4.0–10.5)

## 2014-09-10 MED ORDER — METOPROLOL TARTRATE 25 MG PO TABS
25.0000 mg | ORAL_TABLET | Freq: Two times a day (BID) | ORAL | Status: DC
Start: 2014-09-10 — End: 2014-10-07

## 2014-09-10 MED ORDER — SODIUM CHLORIDE 0.9 % IV BOLUS (SEPSIS)
1000.0000 mL | Freq: Once | INTRAVENOUS | Status: AC
  Administered 2014-09-10: 1000 mL via INTRAVENOUS

## 2014-09-10 NOTE — Progress Notes (Signed)
Pt actually came to [facility] ED from his [place]  pcp Dr Reinaldo Meeker EPIC updated

## 2014-09-10 NOTE — ED Provider Notes (Signed)
CSN: [id number]     Arrival date & time 09/10/14  1145 History   First MD Initiated Contact with Patient 09/10/14 1147     Chief Complaint  Patient presents with  . Hypotension     (Consider location/radiation/quality/duration/timing/severity/associated sxs/prior Treatment) HPI   86yM with hypotension. Noted during urology office visit today and referred to the ED. Pt has no complaints.  When asked about orthostatic symptoms specifically he does endorse that he feels a little lightheaded sometimes with changes in position. On metoprolol and altace. No recent med changes. No acute pain. No sob. Urinating fine. No unusual swelling.   Past Medical History  Diagnosis Date  . Coronary artery disease     a. 1988 s/p CABG x3 with LIMA->LAD, VG->OM, VG->RCA;  b. 1999 s/p inf MI and PCI/stenting of VG->PDA;  c. 05/2013 Cath: LM nl, LAD 132m, D1 90, LCX 100p, RCA 100p, LIMA->LAD nl, VG->OM nl, VG->RPDA 40-50 isr, EF 55-65%->Med Rx.  Marland Kitchen Hypertension   . Hyperlipidemia   . Hypertriglyceridemia   . Gastroesophageal reflux disease with ulceration   . Peptic ulcer disease   . Postoperative atrial fibrillation     history of   . Back pain   . Leg weakness    Past Surgical History  Procedure Laterality Date  . Coronary artery bypass graft  1988    x3in 1988 with left nternal mammary artery to left anterior descending, vein graft to the obtuse marginal, vein graft to the right coronary artery  . Coronary angioplasty with stent placement  11/1997    status post percutaneous coronary intervention and stenting with bare metal stent of the vein graft to the right coronary artery.  . Cardiac catheterization  06/2005     EF 50%. severe native CAD. SVG-RCA 50-60 othewise all grafts patent  . Left heart catheterization with coronary/graft angiogram N/A 06/06/2013    Procedure: LEFT HEART CATHETERIZATION WITH Beatrix Fetters;  Surgeon: Blane Ohara, MD;  Location: [facility] CATH LAB;  Service:  Cardiovascular;  Laterality: N/A;   Family History  Problem Relation Age of Onset  . Stroke Father    History  Substance Use Topics  . Smoking status: Former Smoker    Quit date: 10/14/1961  . Smokeless tobacco: Not on file     Comment: Quit in 1963  . Alcohol Use: No    Review of Systems  All systems reviewed and negative, other than as noted in HPI.   Allergies  Penicillins  Home Medications   Prior to Admission medications   Medication Sig Start Date End Date Taking? Authorizing Provider  albuterol (PROVENTIL) (2.5 MG/3ML) 0.083% nebulizer solution Take 2.5 mg by nebulization every 6 (six) hours as needed.      Historical Provider, MD  aspirin EC 81 MG tablet Take 81 mg by mouth at bedtime.    Historical Provider, MD  beclomethasone (QVAR) 80 MCG/ACT inhaler Inhale 2 puffs into the lungs as needed (for shortness of breath).     Historical Provider, MD  ezetimibe-simvastatin (VYTORIN) 10-40 MG per tablet Take 1 tablet by mouth daily.    Historical Provider, MD  fenofibrate 160 MG tablet Take 160 mg by mouth daily.    Historical Provider, MD  fish oil-omega-3 fatty acids 1000 MG capsule Take 4 g by mouth 2 (two) times daily.     Historical Provider, MD  ipratropium (ATROVENT) 0.02 % nebulizer solution Take 500 mcg by nebulization every 6 (six) hours as needed for shortness of breath.  Historical Provider, MD  metoprolol (LOPRESSOR) 50 MG tablet Take 50 mg by mouth 2 (two) times daily.    Historical Provider, MD  niacin 500 MG tablet Take 1,000 mg by mouth at bedtime.     Historical Provider, MD  ramipril (ALTACE) 10 MG capsule Take 10 mg by mouth daily.    Historical Provider, MD  ranitidine (ZANTAC) 150 MG tablet Take 150 mg by mouth 2 (two) times daily.      Historical Provider, MD  tamsulosin (FLOMAX) 0.4 MG CAPS capsule Take 0.8 mg by mouth daily after supper.    Historical Provider, MD   BP 84/62 mmHg  Pulse 54  Temp(Src) 97.4 F (36.3 C) (Oral)  Resp 16  SpO2  98% Physical Exam  Constitutional: He appears well-developed and well-nourished. No distress.  HENT:  Head: Normocephalic and atraumatic.  Eyes: Conjunctivae are normal. Right eye exhibits no discharge. Left eye exhibits no discharge.  Neck: Neck supple.  Cardiovascular: Normal rate, regular rhythm and normal heart sounds.  Exam reveals no gallop and no friction rub.   No murmur heard. Pulmonary/Chest: Effort normal and breath sounds normal. No respiratory distress.  Abdominal: Soft. He exhibits no distension. There is no tenderness.  Musculoskeletal: He exhibits no edema or tenderness.  Neurological: He is alert.  Skin: Skin is warm and dry.  Psychiatric: He has a normal mood and affect. His behavior is normal. Thought content normal.  Nursing note and vitals reviewed.   ED Course  Procedures (including critical care time) Labs Review Labs Reviewed - No data to display  Imaging Review No results found.   EKG Interpretation None      MDM   Final diagnoses:  Dehydration  Hypotension, unspecified hypotension type    86yM with hypotension. Incidentally noted at Dr's appointment today. He has no acute complaints. W/u significant for AKI. IVF. BP improved. Advised to hold his altace and reduce metoprolol dose to 1/2 until can follow-up with PCP this upcoming week. It has been determined that no acute conditions requiring further emergency intervention are present at this time. The patient has been advised of the diagnosis and plan. I reviewed any labs and imaging including any potential incidental findings. We have discussed signs and symptoms that warrant return to the ED and they are listed in the discharge instructions.      Virgel Manifold, MD 09/16/14 [phone]

## 2014-09-10 NOTE — Discharge Instructions (Signed)
I want you to not take either of your blood pressure medications again until Friday (6/24). Continue altace as prescribed resume metoprolol at lower dose until you can follow-up with your PCP or cardiologist.   Dehydration, Adult Dehydration is when you lose more fluids from the body than you take in. Vital organs like the kidneys, brain, and heart cannot function without a proper amount of fluids and salt. Any loss of fluids from the body can cause dehydration.  CAUSES   Vomiting.  Diarrhea.  Excessive sweating.  Excessive urine output.  Fever. SYMPTOMS  Mild dehydration  Thirst.  Dry lips.  Slightly dry mouth. Moderate dehydration  Very dry mouth.  Sunken eyes.  Skin does not bounce back quickly when lightly pinched and released.  Dark urine and decreased urine production.  Decreased tear production.  Headache. Severe dehydration  Very dry mouth.  Extreme thirst.  Rapid, weak pulse (more than 100 beats per minute at rest).  Cold hands and feet.  Not able to sweat in spite of heat and temperature.  Rapid breathing.  Blue lips.  Confusion and lethargy.  Difficulty being awakened.  Minimal urine production.  No tears. DIAGNOSIS  Your caregiver will diagnose dehydration based on your symptoms and your exam. Blood and urine tests will help confirm the diagnosis. The diagnostic evaluation should also identify the cause of dehydration. TREATMENT  Treatment of mild or moderate dehydration can often be done at home by increasing the amount of fluids that you drink. It is best to drink small amounts of fluid more often. Drinking too much at one time can make vomiting worse. Refer to the home care instructions below. Severe dehydration needs to be treated at the hospital where you will probably be given intravenous (IV) fluids that contain water and electrolytes. HOME CARE INSTRUCTIONS   Ask your caregiver about specific rehydration instructions.  Drink  enough fluids to keep your urine clear or pale yellow.  Drink small amounts frequently if you have nausea and vomiting.  Eat as you normally do.  Avoid:  Foods or drinks high in sugar.  Carbonated drinks.  Juice.  Extremely hot or cold fluids.  Drinks with caffeine.  Fatty, greasy foods.  Alcohol.  Tobacco.  Overeating.  Gelatin desserts.  Wash your hands well to avoid spreading bacteria and viruses.  Only take over-the-counter or prescription medicines for pain, discomfort, or fever as directed by your caregiver.  Ask your caregiver if you should continue all prescribed and over-the-counter medicines.  Keep all follow-up appointments with your caregiver. SEEK MEDICAL CARE IF:  You have abdominal pain and it increases or stays in one area (localizes).  You have a rash, stiff neck, or severe headache.  You are irritable, sleepy, or difficult to awaken.  You are weak, dizzy, or extremely thirsty. SEEK IMMEDIATE MEDICAL CARE IF:   You are unable to keep fluids down or you get worse despite treatment.  You have frequent episodes of vomiting or diarrhea.  You have blood or green matter (bile) in your vomit.  You have blood in your stool or your stool looks black and tarry.  You have not urinated in 6 to 8 hours, or you have only urinated a small amount of very dark urine.  You have a fever.  You faint. MAKE SURE YOU:   Understand these instructions.  Will watch your condition.  Will get help right away if you are not doing well or get worse. Document Released: 03/08/2005 Document Revised: 05/31/2011 Document  Reviewed: 10/26/2010 ExitCare Patient Information 2015 [place], [place]. This information is not intended to replace advice given to you by your health care provider. Make sure you discuss any questions you have with your health care provider.

## 2014-09-10 NOTE — ED Notes (Signed)
Pt sent here from PCP for low BP. BP in 62G systolic at PCP office. Pt on BP medications, BP normally in 315V systolic. 92 systolic a week ago. Denies any recent illness or pain.

## 2014-09-11 ENCOUNTER — Telehealth: Payer: Self-pay | Admitting: Cardiology

## 2014-09-11 NOTE — Telephone Encounter (Signed)
Spoke with pt wife, Follow up scheduled  

## 2014-09-11 NOTE — Telephone Encounter (Signed)
Not seen since 4/15. paov for preop eval Kirk Ruths

## 2014-09-11 NOTE — Telephone Encounter (Signed)
Pt need clarence for green light laser of the prostate and removal of bladder stone. Please fax clarence [phone] Att:Pam

## 2014-09-11 NOTE — Telephone Encounter (Signed)
Forward to Dr Stanford Breed

## 2014-09-19 DIAGNOSIS — I10 Essential (primary) hypertension: Secondary | ICD-10-CM | POA: Diagnosis not present

## 2014-09-19 DIAGNOSIS — Z6823 Body mass index (BMI) 23.0-23.9, adult: Secondary | ICD-10-CM | POA: Diagnosis not present

## 2014-09-19 IMAGING — DX DG CHEST 1V PORT
2 series · 2 of 2 positions shown · non-contrast
Comparison: Chest x-ray of 05/15/2013

CLINICAL DATA: Chest pain, near syncope

EXAM:
PORTABLE CHEST - 1 VIEW

[portable (1 of 2)]
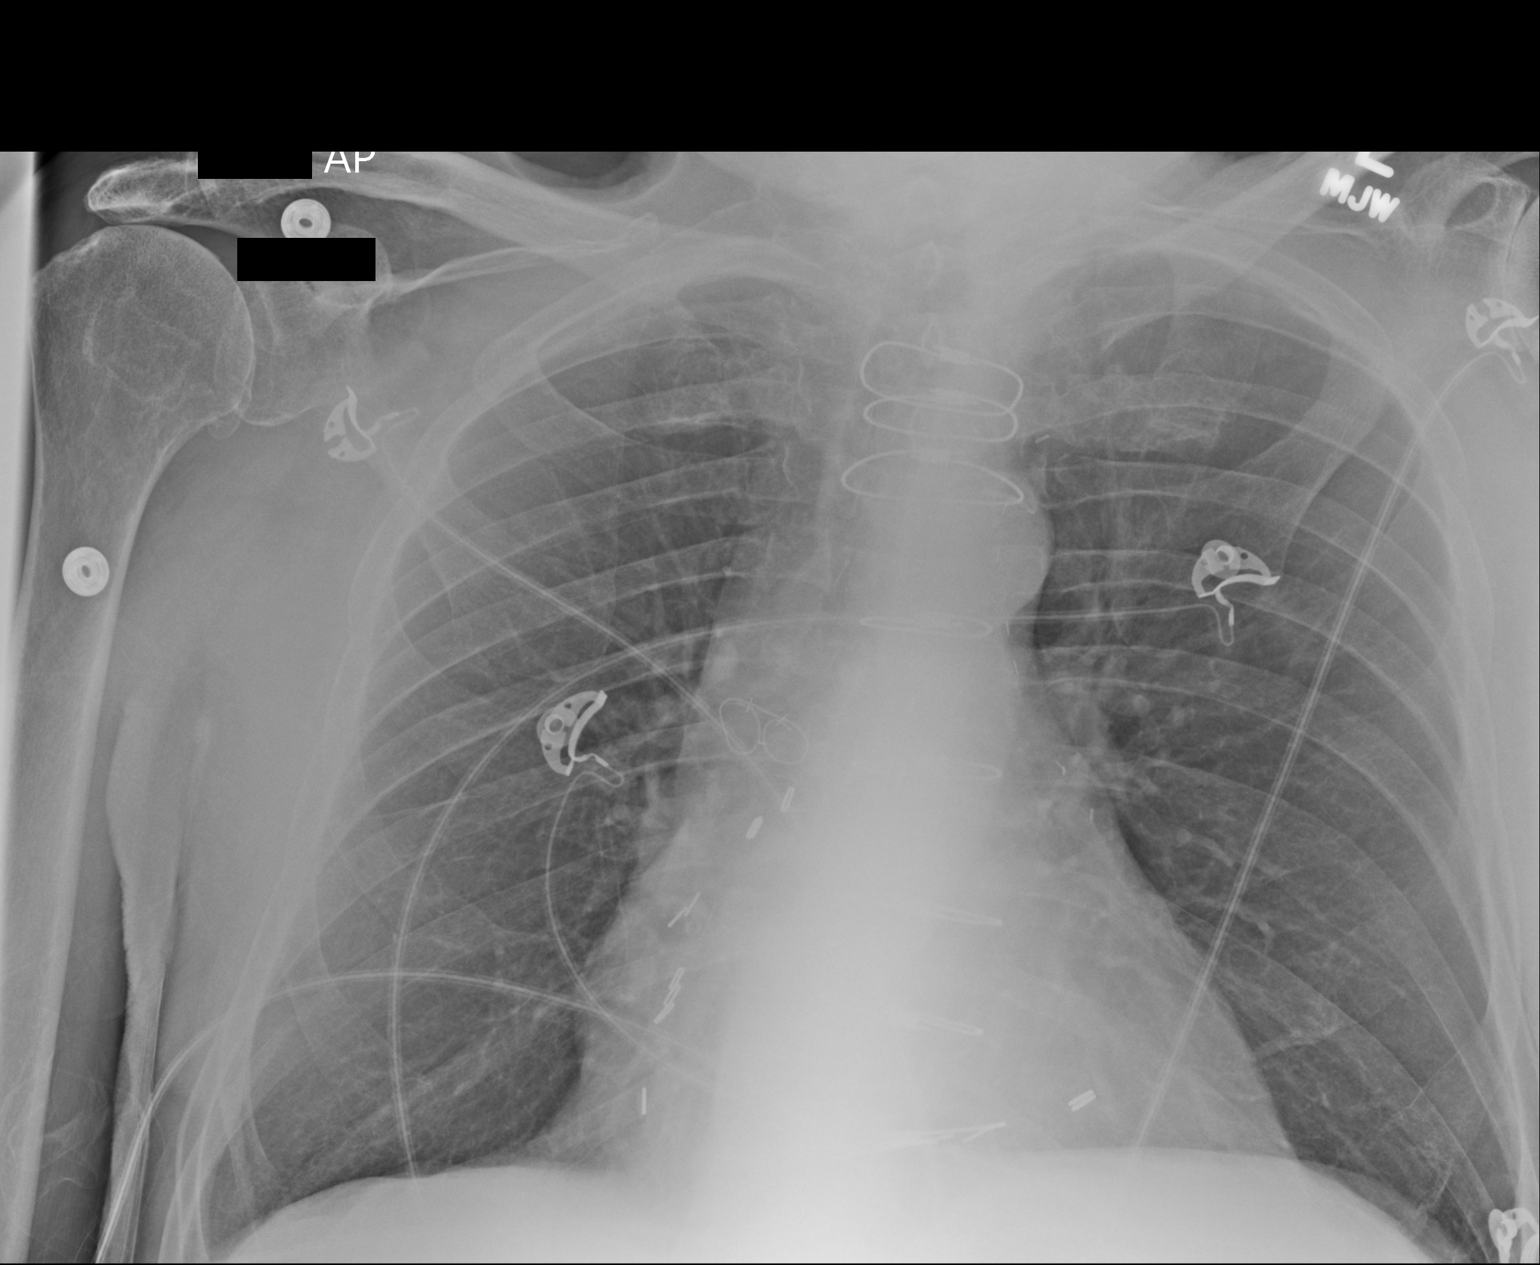

[portable (2 of 2)]
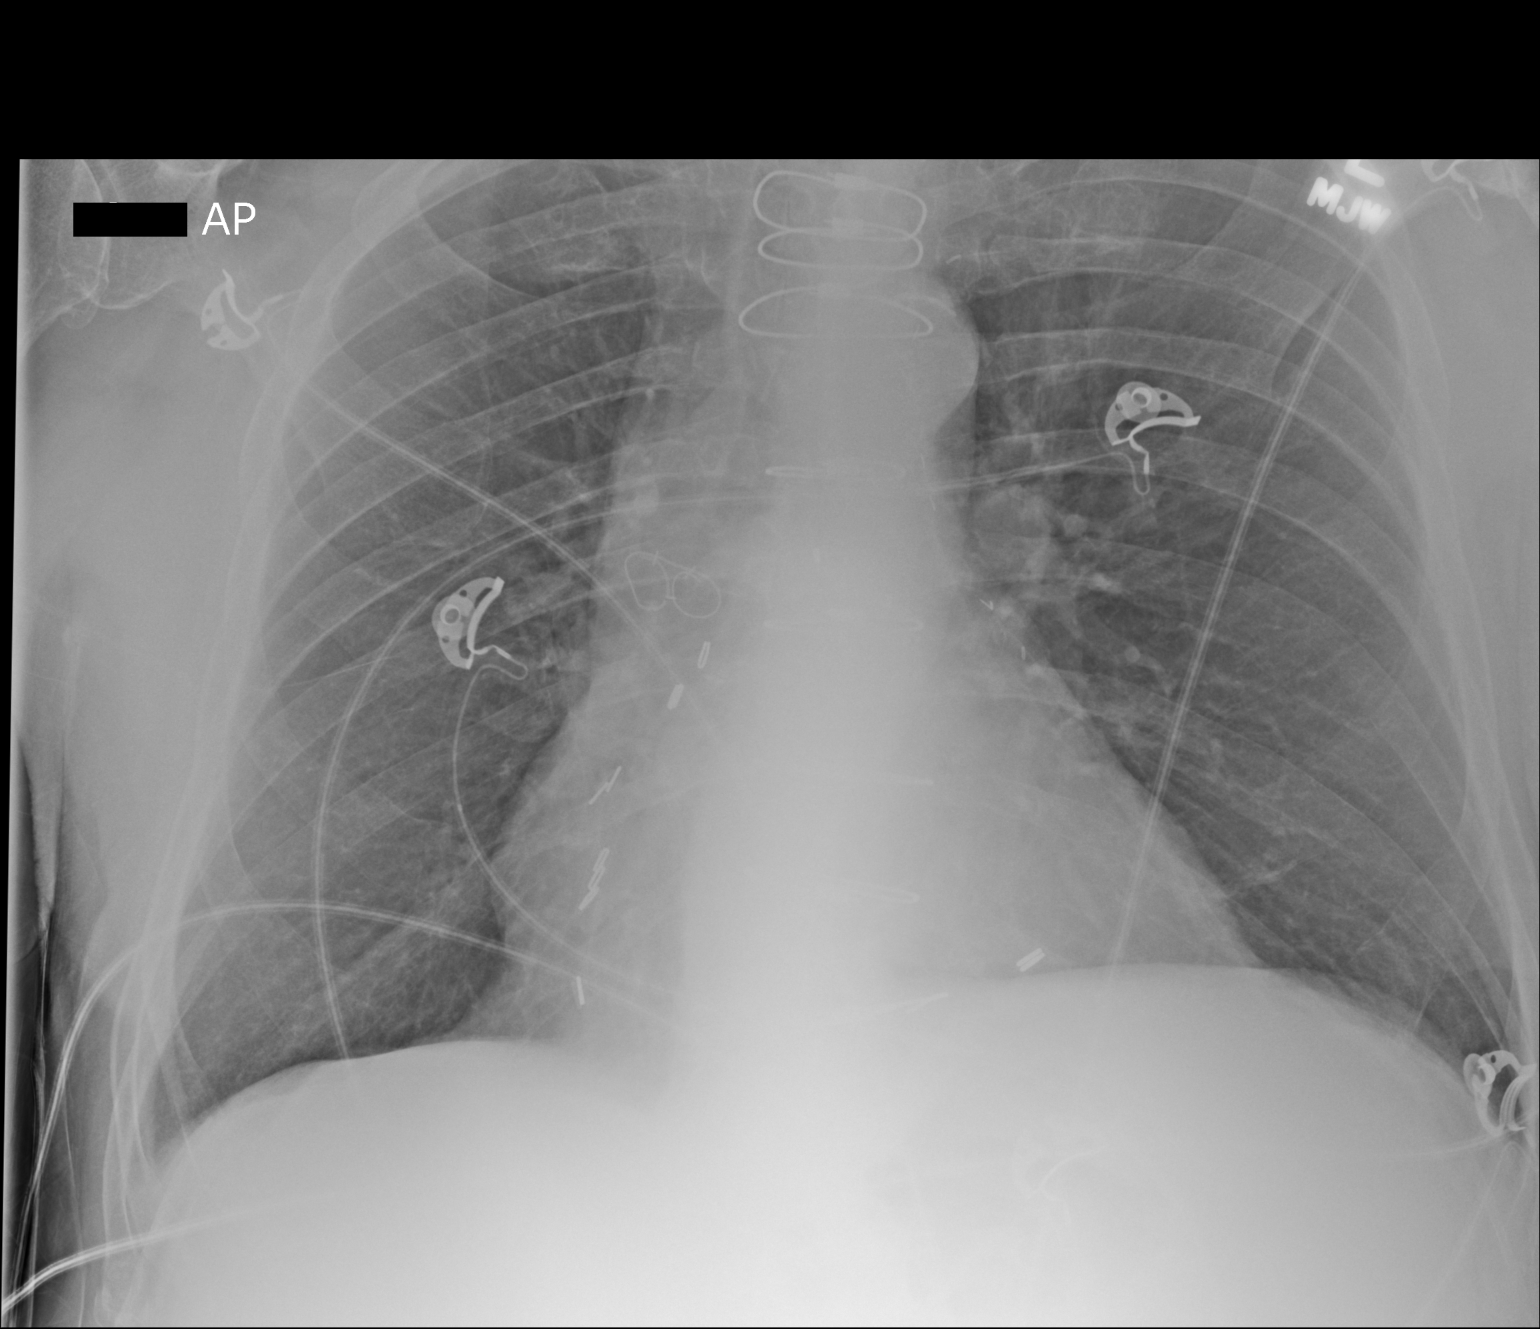

[2 of 2 positions shown; findings below may reference images not displayed]

FINDINGS: No active infiltrate or effusion is seen. The lungs remain slightly
hyper aerated. Cardiomegaly is stable. Median sternotomy sutures are
noted from prior CABG.
IMPRESSION: Stable cardiomegaly.  No active lung disease.

## 2014-10-03 NOTE — Progress Notes (Signed)
HPI: FU coronary artery disease. He is status post bypass grafting in 1988 and 1999. He had an inferior infarct and underwent stenting of the vein graft to the right coronary artery. Abdominal CT in 2000 showed no aneurysm. Carotid Dopplers in Dec 2013 showed 0-39% stenosis bilaterally. ABIs in November of 2011 were normal. Cardiac catheterization in March of 2015 showed severe three-vessel coronary disease with occlusion of all native vessels. The LIMA to the LAD, saphenous vein graft to the acute marginal and saphenous vein graft to the PDA were patent. LV function normal. There was note of a tight lesion in a small diagonal branch and PCI could be considered for refractory symptoms. Echocardiogram in March 2015 showed normal LV function, grade 1 diastolic dysfunction and mild left atrial enlargement. Note he did have a syncopal episode after taking nitroglycerin prior to the admission. Since last seen, he does have some dyspnea on exertion which is unchanged. There is no chest pain. No orthopnea, PND or pedal edema.  Current Outpatient Prescriptions  Medication Sig Dispense Refill  . acetaminophen (TYLENOL) 500 MG tablet Take 1,000 mg by mouth every 6 (six) hours as needed for mild pain, moderate pain, fever or headache.    . albuterol (PROVENTIL) (2.5 MG/3ML) 0.083% nebulizer solution Take 2.5 mg by nebulization every 6 (six) hours as needed for wheezing or shortness of breath.     . Artificial Tear Ointment (DRY EYES OP) Place 2 drops into both eyes daily as needed (for dry eyes).    Marland Kitchen aspirin EC 81 MG tablet Take 81 mg by mouth at bedtime.    . beclomethasone (QVAR) 80 MCG/ACT inhaler Inhale 2 puffs into the lungs daily as needed (for shortness of breath).     . ezetimibe-simvastatin (VYTORIN) 10-40 MG per tablet Take 1 tablet by mouth at bedtime.     . fenofibrate 160 MG tablet Take 160 mg by mouth at bedtime.     . finasteride (PROSCAR) 5 MG tablet Take 5 mg by mouth every evening.      . fish oil-omega-3 fatty acids 1000 MG capsule Take 4 g by mouth 2 (two) times daily.     Marland Kitchen ipratropium (ATROVENT) 0.02 % nebulizer solution Take 500 mcg by nebulization every 6 (six) hours as needed for shortness of breath.     . niacin 500 MG tablet Take 1,000 mg by mouth at bedtime.     . ramipril (ALTACE) 10 MG capsule Take 10 mg by mouth daily with breakfast.     . ranitidine (ZANTAC) 150 MG tablet Take 300 mg by mouth 2 (two) times daily.     . tamsulosin (FLOMAX) 0.4 MG CAPS capsule Take 0.8 mg by mouth daily after supper.     No current facility-administered medications for this visit.     Past Medical History  Diagnosis Date  . Coronary artery disease     a. 1988 s/p CABG x3 with LIMA->LAD, VG->OM, VG->RCA;  b. 1999 s/p inf MI and PCI/stenting of VG->PDA;  c. 05/2013 Cath: LM nl, LAD 161m, D1 90, LCX 100p, RCA 100p, LIMA->LAD nl, VG->OM nl, VG->RPDA 40-50 isr, EF 55-65%->Med Rx.  Marland Kitchen Hypertension   . Hyperlipidemia   . Hypertriglyceridemia   . Gastroesophageal reflux disease with ulceration   . Peptic ulcer disease   . Postoperative atrial fibrillation     history of   . Back pain   . Leg weakness     Past Surgical History  Procedure Laterality  Date  . Coronary artery bypass graft  1988    x3in 1988 with left nternal mammary artery to left anterior descending, vein graft to the obtuse marginal, vein graft to the right coronary artery  . Coronary angioplasty with stent placement  11/1997    status post percutaneous coronary intervention and stenting with bare metal stent of the vein graft to the right coronary artery.  . Cardiac catheterization  06/2005     EF 50%. severe native CAD. SVG-RCA 50-60 othewise all grafts patent  . Left heart catheterization with coronary/graft angiogram N/A 06/06/2013    Procedure: LEFT HEART CATHETERIZATION WITH Beatrix Fetters;  Surgeon: Blane Ohara, MD;  Location: [facility] CATH LAB;  Service: Cardiovascular;  Laterality: N/A;     History   Social History  . Marital Status: Married    Spouse Name: N/A  . Number of Children: N/A  . Years of Education: N/A   Occupational History  . Not on file.   Social History Main Topics  . Smoking status: Former Smoker    Quit date: 10/14/1961  . Smokeless tobacco: Not on file     Comment: Quit in 1963  . Alcohol Use: No  . Drug Use: Not on file  . Sexual Activity: Not on file   Other Topics Concern  . Not on file   Social History Narrative    ROS: no fevers or chills, productive cough, hemoptysis, dysphasia, odynophagia, melena, hematochezia, dysuria, hematuria, rash, seizure activity, orthopnea, PND, pedal edema, claudication. Remaining systems are negative.  Physical Exam: Well-developed well-nourished in no acute distress.  Skin is warm and dry.  HEENT is normal.  Neck is supple.  Chest with diminished breath sounds throughout Cardiovascular exam is regular rate and rhythm.  Abdominal exam nontender or distended. No masses palpated. Extremities show no edema. neuro grossly intact  ECG 09/10/2014-sinus rhythm, first-degree AV block, although conduction delay.

## 2014-10-07 ENCOUNTER — Encounter: Payer: Self-pay | Admitting: Cardiology

## 2014-10-07 ENCOUNTER — Ambulatory Visit (INDEPENDENT_AMBULATORY_CARE_PROVIDER_SITE_OTHER): Payer: Medicare Other | Admitting: Cardiology

## 2014-10-07 VITALS — BP 114/64 | HR 79 | Ht 71.0 in | Wt 151.1 lb

## 2014-10-07 DIAGNOSIS — Z01810 Encounter for preprocedural cardiovascular examination: Secondary | ICD-10-CM | POA: Insufficient documentation

## 2014-10-07 DIAGNOSIS — I2510 Atherosclerotic heart disease of native coronary artery without angina pectoris: Secondary | ICD-10-CM | POA: Diagnosis not present

## 2014-10-07 DIAGNOSIS — I10 Essential (primary) hypertension: Secondary | ICD-10-CM

## 2014-10-07 NOTE — Assessment & Plan Note (Signed)
Continue statin.lipids and liver monitored by primary care. 

## 2014-10-07 NOTE — Assessment & Plan Note (Signed)
Patient will require surgery for nephrolithiasis. His catheterization approximately one year ago as outlined above. No chest pain. No further evaluation indicated preoperatively. Given his age and overall medical condition his risk will be somewhat higher. However he may proceed without further cardiac evaluation.

## 2014-10-07 NOTE — Patient Instructions (Signed)
Your physician wants you to follow-up in: ONE YEAR WITH DR CRENSHAW You will receive a reminder letter in the mail two months in advance. If you don't receive a letter, please call our office to schedule the follow-up appointment.  

## 2014-10-07 NOTE — Assessment & Plan Note (Signed)
Blood pressure controlled. Continue present medications. 

## 2014-10-07 NOTE — Assessment & Plan Note (Signed)
Continue aspirin and statin. No chest pain.

## 2014-10-07 NOTE — Assessment & Plan Note (Signed)
Continue aspirin and statin. 

## 2014-10-10 ENCOUNTER — Telehealth: Payer: Self-pay | Admitting: *Deleted

## 2014-10-10 NOTE — Telephone Encounter (Signed)
Office note containing clearance for CYSTOLITHOLAPAXY TURP faxed

## 2014-10-31 DIAGNOSIS — Z6823 Body mass index (BMI) 23.0-23.9, adult: Secondary | ICD-10-CM | POA: Diagnosis not present

## 2014-10-31 DIAGNOSIS — Z0000 Encounter for general adult medical examination without abnormal findings: Secondary | ICD-10-CM | POA: Diagnosis not present

## 2014-11-07 DIAGNOSIS — Z1211 Encounter for screening for malignant neoplasm of colon: Secondary | ICD-10-CM | POA: Diagnosis not present

## 2014-11-07 DIAGNOSIS — N138 Other obstructive and reflux uropathy: Secondary | ICD-10-CM | POA: Diagnosis not present

## 2014-11-07 DIAGNOSIS — N210 Calculus in bladder: Secondary | ICD-10-CM | POA: Diagnosis not present

## 2014-11-07 DIAGNOSIS — N200 Calculus of kidney: Secondary | ICD-10-CM | POA: Diagnosis not present

## 2014-11-07 DIAGNOSIS — N401 Enlarged prostate with lower urinary tract symptoms: Secondary | ICD-10-CM | POA: Diagnosis not present

## 2014-11-07 DIAGNOSIS — N323 Diverticulum of bladder: Secondary | ICD-10-CM | POA: Diagnosis not present

## 2014-11-11 ENCOUNTER — Other Ambulatory Visit: Payer: Self-pay | Admitting: Urology

## 2014-11-14 ENCOUNTER — Other Ambulatory Visit: Payer: Self-pay | Admitting: Urology

## 2014-11-21 NOTE — Progress Notes (Addendum)
lov dr Stanford Breed cardiology clearance note  10-07-14 epic Echo 06-07-13 epic ekg 09-10-14 epic

## 2014-11-21 NOTE — Patient Instructions (Addendum)
AVIEL DAVALOS  11/21/2014   Your procedure is scheduled on: Friday September 9th, 2016  Report to [facility] Main  Entrance take [place]  elevators to 3rd floor to  [place] at 700 AM.  Call this number if you have problems the morning of surgery [phone]   Remember: ONLY 1 PERSON MAY GO WITH YOU TO SHORT STAY TO GET  READY MORNING OF [place].  Do not eat food or drink liquids :After Midnight.     Take these medicines the morning of surgery with A SIP OF WATER: albuterol nebulizer, atrovent inhaler,  qvar inhaler as needed and bring inhaler, zantac                               You may not have any metal on your body including hair pins and              piercings  Do not wear jewelry, make-up, lotions, powders or perfumes, deodorant             Do not wear nail polish.  Do not shave  48 hours prior to surgery.              Men may shave face and neck.   Do not bring valuables to the hospital. [place].  Contacts, dentures or bridgework may not be worn into surgery.  Leave suitcase in the car. After surgery it may be brought to your room.     Patients discharged the day of surgery will not be allowed to drive home.  Name and phone number of your driver:  Special Instructions: N/A              Please read over the following fact sheets you were given: _____________________________________________________________________             [facility] - Preparing for Surgery Before surgery, you can play an important role.  Because skin is not sterile, your skin needs to be as free of germs as possible.  You can reduce the number of germs on your skin by washing with CHG (chlorahexidine gluconate) soap before surgery.  CHG is an antiseptic cleaner which kills germs and bonds with the skin to continue killing germs even after washing. Please DO NOT use if you have an allergy to CHG or antibacterial soaps.   If your skin becomes reddened/irritated stop using the CHG and inform your nurse when you arrive at Short Stay. Do not shave (including legs and underarms) for at least 48 hours prior to the first CHG shower.  You may shave your face/neck. Please follow these instructions carefully:  1.  Shower with CHG Soap the night before surgery and the  morning of Surgery.  2.  If you choose to wash your hair, wash your hair first as usual with your  normal  shampoo.  3.  After you shampoo, rinse your hair and body thoroughly to remove the  shampoo.                           4.  Use CHG as you would any other liquid soap.  You can apply chg directly  to the skin and  wash                       Gently with a scrungie or clean washcloth.  5.  Apply the CHG Soap to your body ONLY FROM THE NECK DOWN.   Do not use on face/ open                           Wound or open sores. Avoid contact with eyes, ears mouth and genitals (private parts).                       Wash face,  Genitals (private parts) with your normal soap.             6.  Wash thoroughly, paying special attention to the area where your surgery  will be performed.  7.  Thoroughly rinse your body with warm water from the neck down.  8.  DO NOT shower/wash with your normal soap after using and rinsing off  the CHG Soap.                9.  Pat yourself dry with a clean towel.            10.  Wear clean pajamas.            11.  Place clean sheets on your bed the night of your first shower and do not  sleep with pets. Day of Surgery : Do not apply any lotions/deodorants the morning of surgery.  Please wear clean clothes to the hospital/surgery center.  FAILURE TO FOLLOW THESE INSTRUCTIONS MAY RESULT IN THE CANCELLATION OF YOUR SURGERY PATIENT SIGNATURE_________________________________  NURSE SIGNATURE__________________________________  ________________________________________________________________________

## 2014-11-26 ENCOUNTER — Encounter (HOSPITAL_COMMUNITY)
Admission: RE | Admit: 2014-11-26 | Discharge: 2014-11-26 | Disposition: A | Discharge status: Home / Self Care | Payer: Medicare Other | Source: Ambulatory Visit | Attending: Urology | Admitting: Urology

## 2014-11-26 ENCOUNTER — Encounter (HOSPITAL_COMMUNITY): Payer: Self-pay

## 2014-11-26 DIAGNOSIS — N200 Calculus of kidney: Secondary | ICD-10-CM | POA: Diagnosis present

## 2014-11-26 DIAGNOSIS — R3911 Hesitancy of micturition: Secondary | ICD-10-CM | POA: Diagnosis not present

## 2014-11-26 DIAGNOSIS — N132 Hydronephrosis with renal and ureteral calculous obstruction: Secondary | ICD-10-CM | POA: Diagnosis not present

## 2014-11-26 DIAGNOSIS — J449 Chronic obstructive pulmonary disease, unspecified: Secondary | ICD-10-CM | POA: Diagnosis not present

## 2014-11-26 DIAGNOSIS — N323 Diverticulum of bladder: Secondary | ICD-10-CM | POA: Diagnosis not present

## 2014-11-26 DIAGNOSIS — I10 Essential (primary) hypertension: Secondary | ICD-10-CM | POA: Diagnosis not present

## 2014-11-26 DIAGNOSIS — I252 Old myocardial infarction: Secondary | ICD-10-CM | POA: Diagnosis not present

## 2014-11-26 DIAGNOSIS — F0390 Unspecified dementia without behavioral disturbance: Secondary | ICD-10-CM | POA: Diagnosis not present

## 2014-11-26 DIAGNOSIS — E780 Pure hypercholesterolemia: Secondary | ICD-10-CM | POA: Diagnosis not present

## 2014-11-26 DIAGNOSIS — Z87891 Personal history of nicotine dependence: Secondary | ICD-10-CM | POA: Diagnosis not present

## 2014-11-26 DIAGNOSIS — I4891 Unspecified atrial fibrillation: Secondary | ICD-10-CM | POA: Diagnosis not present

## 2014-11-26 DIAGNOSIS — Z951 Presence of aortocoronary bypass graft: Secondary | ICD-10-CM | POA: Diagnosis not present

## 2014-11-26 DIAGNOSIS — N138 Other obstructive and reflux uropathy: Secondary | ICD-10-CM | POA: Diagnosis not present

## 2014-11-26 DIAGNOSIS — N401 Enlarged prostate with lower urinary tract symptoms: Secondary | ICD-10-CM | POA: Diagnosis not present

## 2014-11-26 DIAGNOSIS — R338 Other retention of urine: Secondary | ICD-10-CM | POA: Diagnosis not present

## 2014-11-26 DIAGNOSIS — Z7982 Long term (current) use of aspirin: Secondary | ICD-10-CM | POA: Diagnosis not present

## 2014-11-26 HISTORY — DX: Unspecified hemorrhoids: K64.9

## 2014-11-26 HISTORY — DX: Chronic obstructive pulmonary disease, unspecified: J44.9

## 2014-11-26 HISTORY — DX: Acute myocardial infarction, unspecified: I21.9

## 2014-11-26 HISTORY — DX: Pain in unspecified joint: M25.50

## 2014-11-26 HISTORY — DX: Unspecified dementia, unspecified severity, without behavioral disturbance, psychotic disturbance, mood disturbance, and anxiety: F03.90

## 2014-11-26 HISTORY — DX: Unspecified osteoarthritis, unspecified site: M19.90

## 2014-11-26 LAB — BASIC METABOLIC PANEL
Anion gap: 6 (ref 5–15)
BUN: 25 mg/dL — AB (ref 6–20)
CO2: 25 mmol/L (ref 22–32)
Calcium: 9.5 mg/dL (ref 8.9–10.3)
Chloride: 106 mmol/L (ref 101–111)
Creatinine, Ser: 2 mg/dL — ABNORMAL HIGH (ref 0.61–1.24)
GFR calc non Af Amer: 29 mL/min — ABNORMAL LOW (ref >60)
GFR, EST AFRICAN AMERICAN: 34 mL/min — AB (ref >60)
Glucose, Bld: 108 mg/dL — ABNORMAL HIGH (ref 65–99)
POTASSIUM: 4.5 mmol/L (ref 3.5–5.1)
Sodium: 137 mmol/L (ref 135–145)

## 2014-11-26 LAB — CBC
HEMATOCRIT: 41.9 % (ref 39.0–52.0)
HEMOGLOBIN: 14 g/dL (ref 13.0–17.0)
MCH: 31.5 pg (ref 26.0–34.0)
MCHC: 33.4 g/dL (ref 30.0–36.0)
MCV: 94.4 fL (ref 78.0–100.0)
Platelets: 173 10*3/uL (ref 150–400)
RBC: 4.44 MIL/uL (ref 4.22–5.81)
RDW: 13.3 % (ref 11.5–15.5)
WBC: 5.7 10*3/uL (ref 4.0–10.5)

## 2014-11-26 NOTE — Progress Notes (Signed)
BMET RESULTS FAXED BY EPIC TO DR ESKRIDGE 

## 2014-11-26 NOTE — Progress Notes (Signed)
   11/26/14 1444  OBSTRUCTIVE SLEEP APNEA  Have you ever been diagnosed with sleep apnea through a sleep study? No  Do you snore loudly (loud enough to be heard through closed doors)?  1  Do you often feel tired, fatigued, or sleepy during the daytime? 0  Has anyone observed you stop breathing during your sleep? 0  Do you have, or are you being treated for high blood pressure? 1  BMI more than 35 kg/m2? 0  Age over 79 years old? 1  Neck circumference greater than 40 cm/16 inches? 0  Gender: 1  Obstructive Sleep Apnea Score 4

## 2014-11-28 NOTE — H&P (Signed)
History of Present Illness Consultation for renal stones, BPH associated with gross hematuria, bladder diverticulum and bladder stone for by Dr. Janice Norrie. PCP is Dr. Henrene Pastor.    1-BPH associated with incomplete bladder emptying, bladder diverticulum, bladder stone-patient presented with gross hematuria. CT scan revealed small kidney stones, 8 mm right UPJ stone without hydro-, bladder diverticulum on the right with high-density material possible tumor, 60 mm stone in bladder, BPH regrowth. Recent PVR was 168 - 266 ml. Patient has a history of prior retention and TURP. Dr. Janice Norrie perform cystoscopy recently and noted the stone in the bladder and regrowth of prostate tissue. Patient's urine culture initially positive for enterococcus he was treated with antibiotics and repeat urine culture was clear.    Last PSA in 2010 was 2.68.    Today, he continues tamsulosin and finasteride. He confirms that with his daughter he is on combination therapy. He has some mild dysuria and suprapubic discomfort.    2-nephrolithiasis-patient with 8 mm right UPJ stone on June 2016 CT.     KUB today-stone visible in bladder, may still be in diverticulum.   Past Medical History Problems  1. History of Acute Myocardial Infarction 2. History of Arthritis 3. History of Bladder Calculus 4. History of Bladder Calculus 5. History of heartburn (Z87.898) 6. History of hypercholesterolemia (Z86.39) 7. History of hypertension (Z86.79)  Surgical History Problems  1. History of Back Surgery 2. History of CABG (CABG) 3. History of Cystoscopy With Fragmentation Of Bladder Calculus 4. History of Neck Surgery 5. History of Transurethral Resection Of Prostate (TURP)  Current Meds 1. Altace 10 MG Oral Capsule;  Therapy: (Recorded:12Jan2009) to Recorded 2. Aspirin 81 MG TABS;  Therapy: (Recorded:12Jan2009) to Recorded 3. Finasteride 5 MG Oral Tablet; TAKE 1 TABLET DAILY AS DIRECTED;  Therapy: [id number] to  (Evaluate:13Feb2017)  Requested for: 17Aug2016; Last  Rx:17Aug2016; Status: ACTIVE - Retrospective Authorization Ordered 4. Fish Oil CAPS;  Therapy: (Recorded:12Jan2009) to Recorded 5. Fluticasone Propionate 50 MCG/ACT Nasal Suspension;  Therapy: [id number] to Recorded 6. Levofloxacin 250 MG Oral Tablet; TAKE 1 TABLET DAILY AS DIRECTED;  Therapy: [id number] to (Evaluate:20Jun2016); Last Rx:10Jun2016 Ordered 7. Namenda TABS;  Therapy: (Recorded:13Feb2015) to Recorded 8. Niacin 500 MG Oral Tablet;  Therapy: (Recorded:12Jan2009) to Recorded 9. Qvar 80 MCG/ACT Inhalation Aerosol Solution;  Therapy: [id number] to Recorded 10. Ranitidine HCl - 150 MG Oral Tablet;   Therapy: (Recorded:23Feb2012) to Recorded 11. Tamsulosin HCl - 0.4 MG Oral Capsule; TAKE 2 CAPSULES BY MOUTH DAILY;   Therapy: [id number] to (Evaluate:24Aug2016)  Requested for: [phone]; Last   Rx:26Feb2016 Ordered 12. Tricor 145 MG Oral Tablet;   Therapy: (Recorded:12Jan2009) to Recorded 13. Vytorin 10-40 MG Oral Tablet;   Therapy: (Recorded:12Jan2009) to Recorded  Allergies Medication  1. Penicillins  Family History Problems  1. Family history of Family Health Status Number Of Children : Father 2. Family history of Father Deceased At Age ____ : Father 47. Family history of Hypertension 4. Family history of Mother Deceased At Age ____ : Father  Social History Problems    Denied: History of Alcohol Use   Caffeine Use   Former smoker [phone])   Marital History - Currently Married   Tobacco Use  Physical Exam Constitutional: Well nourished and well developed . No acute distress.  Pulmonary: No respiratory distress and normal respiratory rhythm and effort.  Cardiovascular: Heart rate and rhythm are normal . No peripheral edema.  Neuro/Psych:. Mood and affect are appropriate.    Results/Data  Old records or history reviewed:Marland Kitchen  The following  images/tracing/specimen were independently visualized:  CT, KUB.     Procedure KUB-scoliosis and spinal fusion evident. Degenerative changes of the spine appear significant. Soft tissue appeared normal. Stone in the right side of bladder stable. I didn't appreciate any stones over the course of the kidneys or the ureter.     Assessment Assessed  1. Calculus of right kidney (N20.0) 2. Diverticulum, bladder acquired (N32.3) 3. Bladder calculus (N21.0) 4. Benign prostatic hyperplasia with urinary obstruction (N40.1,N13.8)  Plan  Benign prostatic hyperplasia with urinary obstruction  1. Follow-up Schedule Surgery Office  Follow-up  Status: Hold For - Appointment   Requested for: [phone] Bladder calculus  2. URINE CULTURE; Status:Hold For - Specimen/Data Collection,Appointment; Requested  for:18Aug2016;   Discussion/Summary BPH, associated with gross hematuria, incomplete emptying, bladder diverticulum, bladder stone - I discussed with the patient and his wife the nature risks benefits and alternatives to cystoscopy, TURP, cystolitholapaxy, possible TURBT. All questions answered. We discussed risk of cardiopulmonary issues, bleeding, infection, incontinence and stricture among others. Discussed alternatives such as continuing surveillance and medical therapy.  He also discussed the diverticulum possible need for TURBT if there's a tumor in there.     He was cleared for surgery, "Patient will require surgery for nephrolithiasis. His catheterization approximately one year ago as outlined above. No chest pain. No further evaluation indicated preoperatively. Given his age and overall medical condition his risk will be somewhat higher. However he may proceed without further cardiac evaluation."    Right UPJ stone - KUB today equivocal. We'll plan to do a right retrograde and consider ureteroscopy and laser lithotripsy if things are going smoothly. We discussed even with the kidney stone but also with all were doing in the bladder he may need a staged procedure,  but It might make sense to go and place a right ureteral stent while under anesthesia and plan a staged procedure to clear out the kidney stones on the right.    Signatures Electronically signed by : Festus Aloe, M.D.; Nov 07 2014  5:00PM EST

## 2014-11-29 ENCOUNTER — Ambulatory Visit (HOSPITAL_COMMUNITY): Payer: Medicare Other | Admitting: Anesthesiology

## 2014-11-29 ENCOUNTER — Encounter (HOSPITAL_COMMUNITY)
Admission: RE | Disposition: A | Discharge status: Home / Self Care | Payer: Self-pay | Source: Ambulatory Visit | Attending: Urology

## 2014-11-29 ENCOUNTER — Encounter (HOSPITAL_COMMUNITY): Payer: Self-pay | Admitting: *Deleted

## 2014-11-29 ENCOUNTER — Ambulatory Visit (HOSPITAL_COMMUNITY)
Admission: RE | Admit: 2014-11-29 | Discharge: 2014-11-29 | Disposition: A | Discharge status: Home / Self Care | Payer: Medicare Other | Source: Ambulatory Visit | Attending: Urology | Admitting: Urology

## 2014-11-29 DIAGNOSIS — R3911 Hesitancy of micturition: Secondary | ICD-10-CM | POA: Insufficient documentation

## 2014-11-29 DIAGNOSIS — R338 Other retention of urine: Secondary | ICD-10-CM | POA: Insufficient documentation

## 2014-11-29 DIAGNOSIS — J449 Chronic obstructive pulmonary disease, unspecified: Secondary | ICD-10-CM | POA: Insufficient documentation

## 2014-11-29 DIAGNOSIS — N323 Diverticulum of bladder: Secondary | ICD-10-CM | POA: Diagnosis not present

## 2014-11-29 DIAGNOSIS — F0390 Unspecified dementia without behavioral disturbance: Secondary | ICD-10-CM | POA: Insufficient documentation

## 2014-11-29 DIAGNOSIS — R310 Gross hematuria: Secondary | ICD-10-CM | POA: Diagnosis not present

## 2014-11-29 DIAGNOSIS — N401 Enlarged prostate with lower urinary tract symptoms: Secondary | ICD-10-CM | POA: Insufficient documentation

## 2014-11-29 DIAGNOSIS — Z7982 Long term (current) use of aspirin: Secondary | ICD-10-CM | POA: Insufficient documentation

## 2014-11-29 DIAGNOSIS — N132 Hydronephrosis with renal and ureteral calculous obstruction: Secondary | ICD-10-CM | POA: Insufficient documentation

## 2014-11-29 DIAGNOSIS — I10 Essential (primary) hypertension: Secondary | ICD-10-CM | POA: Insufficient documentation

## 2014-11-29 DIAGNOSIS — Z87891 Personal history of nicotine dependence: Secondary | ICD-10-CM | POA: Insufficient documentation

## 2014-11-29 DIAGNOSIS — E780 Pure hypercholesterolemia: Secondary | ICD-10-CM | POA: Insufficient documentation

## 2014-11-29 DIAGNOSIS — I2510 Atherosclerotic heart disease of native coronary artery without angina pectoris: Secondary | ICD-10-CM | POA: Diagnosis not present

## 2014-11-29 DIAGNOSIS — I4891 Unspecified atrial fibrillation: Secondary | ICD-10-CM | POA: Insufficient documentation

## 2014-11-29 DIAGNOSIS — I252 Old myocardial infarction: Secondary | ICD-10-CM | POA: Insufficient documentation

## 2014-11-29 DIAGNOSIS — N210 Calculus in bladder: Secondary | ICD-10-CM | POA: Diagnosis not present

## 2014-11-29 DIAGNOSIS — N138 Other obstructive and reflux uropathy: Secondary | ICD-10-CM

## 2014-11-29 DIAGNOSIS — N400 Enlarged prostate without lower urinary tract symptoms: Secondary | ICD-10-CM | POA: Diagnosis not present

## 2014-11-29 DIAGNOSIS — Z951 Presence of aortocoronary bypass graft: Secondary | ICD-10-CM | POA: Insufficient documentation

## 2014-11-29 DIAGNOSIS — N201 Calculus of ureter: Secondary | ICD-10-CM | POA: Diagnosis not present

## 2014-11-29 HISTORY — PX: CYSTOSCOPY WITH LITHOLAPAXY: SHX1425

## 2014-11-29 HISTORY — PX: TRANSURETHRAL RESECTION OF PROSTATE: SHX73

## 2014-11-29 HISTORY — PX: HOLMIUM LASER APPLICATION: SHX5852

## 2014-11-29 HISTORY — PX: CYSTOSCOPY WITH RETROGRADE PYELOGRAM, URETEROSCOPY AND STENT PLACEMENT: SHX5789

## 2014-11-29 SURGERY — TURP (TRANSURETHRAL RESECTION OF PROSTATE)
Anesthesia: General | Laterality: Right

## 2014-11-29 MED ORDER — PHENYLEPHRINE HCL 10 MG/ML IJ SOLN
10.0000 mg | INTRAVENOUS | Status: DC | PRN
  Administered 2014-11-29: 20 ug/min via INTRAVENOUS

## 2014-11-29 MED ORDER — PROPOFOL 10 MG/ML IV BOLUS
INTRAVENOUS | Status: DC | PRN
  Administered 2014-11-29: 100 mg via INTRAVENOUS
  Administered 2014-11-29: 20 mg via INTRAVENOUS

## 2014-11-29 MED ORDER — IOHEXOL 350 MG/ML SOLN
INTRAVENOUS | Status: DC | PRN
  Administered 2014-11-29: 10 mL via URETHRAL

## 2014-11-29 MED ORDER — SODIUM CHLORIDE 0.9 % IR SOLN
Status: DC | PRN
  Administered 2014-11-29: 13000 mL via INTRAVESICAL

## 2014-11-29 MED ORDER — ONDANSETRON HCL 4 MG/2ML IJ SOLN
INTRAMUSCULAR | Status: DC | PRN
  Administered 2014-11-29: 4 mg via INTRAVENOUS

## 2014-11-29 MED ORDER — LACTATED RINGERS IV SOLN
INTRAVENOUS | Status: DC | PRN
  Administered 2014-11-29: 08:00:00 via INTRAVENOUS

## 2014-11-29 MED ORDER — ROCURONIUM BROMIDE 100 MG/10ML IV SOLN
INTRAVENOUS | Status: AC
  Filled 2014-11-29: qty 1

## 2014-11-29 MED ORDER — PHENYLEPHRINE HCL 10 MG/ML IJ SOLN
INTRAMUSCULAR | Status: DC | PRN
  Administered 2014-11-29 (×2): 40 ug via INTRAVENOUS

## 2014-11-29 MED ORDER — PROPOFOL 10 MG/ML IV BOLUS
INTRAVENOUS | Status: AC
  Filled 2014-11-29: qty 20

## 2014-11-29 MED ORDER — EPHEDRINE SULFATE 50 MG/ML IJ SOLN
INTRAMUSCULAR | Status: AC
  Filled 2014-11-29: qty 1

## 2014-11-29 MED ORDER — CEFAZOLIN SODIUM-DEXTROSE 2-3 GM-% IV SOLR: 2.0000 g | INTRAVENOUS | Status: DC

## 2014-11-29 MED ORDER — LIDOCAINE HCL (CARDIAC) 20 MG/ML IV SOLN
INTRAVENOUS | Status: DC | PRN
  Administered 2014-11-29: 40 mg via INTRAVENOUS

## 2014-11-29 MED ORDER — SODIUM CHLORIDE 0.9 % IJ SOLN
INTRAMUSCULAR | Status: AC
  Filled 2014-11-29: qty 10

## 2014-11-29 MED ORDER — PHENYLEPHRINE 40 MCG/ML (10ML) SYRINGE FOR IV PUSH (FOR BLOOD PRESSURE SUPPORT)
PREFILLED_SYRINGE | INTRAVENOUS | Status: AC
  Filled 2014-11-29: qty 10

## 2014-11-29 MED ORDER — FENTANYL CITRATE (PF) 100 MCG/2ML IJ SOLN
INTRAMUSCULAR | Status: DC | PRN
  Administered 2014-11-29 (×3): 25 ug via INTRAVENOUS

## 2014-11-29 MED ORDER — PHENYLEPHRINE HCL 10 MG/ML IJ SOLN
INTRAMUSCULAR | Status: AC
  Filled 2014-11-29: qty 1

## 2014-11-29 MED ORDER — ONDANSETRON HCL 4 MG/2ML IJ SOLN
INTRAMUSCULAR | Status: AC
  Filled 2014-11-29: qty 2

## 2014-11-29 MED ORDER — FENTANYL CITRATE (PF) 100 MCG/2ML IJ SOLN: 25.0000 ug | INTRAMUSCULAR | Status: DC | PRN

## 2014-11-29 MED ORDER — FENTANYL CITRATE (PF) 100 MCG/2ML IJ SOLN
INTRAMUSCULAR | Status: AC
  Filled 2014-11-29: qty 4

## 2014-11-29 MED ORDER — LIDOCAINE HCL (CARDIAC) 20 MG/ML IV SOLN
INTRAVENOUS | Status: AC
  Filled 2014-11-29: qty 5

## 2014-11-29 MED ORDER — LEVOFLOXACIN IN D5W 500 MG/100ML IV SOLN
500.0000 mg | Freq: Once | INTRAVENOUS | Status: AC
  Administered 2014-11-29: 500 mg via INTRAVENOUS
  Filled 2014-11-29: qty 100

## 2014-11-29 SURGICAL SUPPLY — 39 items
BAG URINE DRAINAGE (UROLOGICAL SUPPLIES) ×4 IMPLANT
BAG URO CATCHER STRL LF (DRAPE) ×4 IMPLANT
BASKET LASER NITINOL 1.9FR (BASKET) IMPLANT
BASKET STNLS GEMINI 4WIRE 3FR (BASKET) IMPLANT
BASKET ZERO TIP NITINOL 2.4FR (BASKET) ×4 IMPLANT
BRUSH URET BIOPSY 3F (UROLOGICAL SUPPLIES) IMPLANT
CATH INTERMIT  6FR 70CM (CATHETERS) IMPLANT
CATH TIEMANN FOLEY 18FR 5CC (CATHETERS) ×4 IMPLANT
CLOTH BEACON ORANGE TIMEOUT ST (SAFETY) ×4 IMPLANT
ELECT REM PT RETURN 9FT ADLT (ELECTROSURGICAL)
ELECTRODE REM PT RTRN 9FT ADLT (ELECTROSURGICAL) IMPLANT
EVACUATOR MICROVAS BLADDER (UROLOGICAL SUPPLIES) IMPLANT
FIBER LASER FLEXIVA 1000 (UROLOGICAL SUPPLIES) ×4 IMPLANT
FIBER LASER FLEXIVA 200 (UROLOGICAL SUPPLIES) IMPLANT
FIBER LASER FLEXIVA 365 (UROLOGICAL SUPPLIES) IMPLANT
FIBER LASER FLEXIVA 550 (UROLOGICAL SUPPLIES) IMPLANT
FIBER LASER TRAC TIP (UROLOGICAL SUPPLIES) IMPLANT
GLOVE BIOGEL M STRL SZ7.5 (GLOVE) ×12 IMPLANT
GOWN STRL REUS W/TWL LRG LVL3 (GOWN DISPOSABLE) IMPLANT
GOWN STRL REUS W/TWL XL LVL3 (GOWN DISPOSABLE) ×12 IMPLANT
GUIDEWIRE ANG ZIPWIRE 038X150 (WIRE) IMPLANT
GUIDEWIRE STR DUAL SENSOR (WIRE) ×4 IMPLANT
HOLDER FOLEY CATH W/STRAP (MISCELLANEOUS) ×4 IMPLANT
IV NS IRRIG 3000ML ARTHROMATIC (IV SOLUTION) IMPLANT
KIT BALLIN UROMAX 15FX10 (LABEL) IMPLANT
KIT BALLN UROMAX 15FX4 (MISCELLANEOUS) IMPLANT
KIT BALLN UROMAX 26 75X4 (MISCELLANEOUS)
LOOP CUT BIPOLAR 24F LRG (ELECTROSURGICAL) ×4 IMPLANT
MANIFOLD NEPTUNE II (INSTRUMENTS) ×4 IMPLANT
PACK CYSTO (CUSTOM PROCEDURE TRAY) ×4 IMPLANT
SET HIGH PRES BAL DIL (LABEL)
SHEATH ACCESS URETERAL 24CM (SHEATH) IMPLANT
SHEATH ACCESS URETERAL 38CM (SHEATH) ×4 IMPLANT
SHEATH ACCESS URETERAL 54CM (SHEATH) IMPLANT
STENT CONTOUR 6FRX26X.038 (STENTS) ×4 IMPLANT
SYR 30ML LL (SYRINGE) ×4 IMPLANT
SYRINGE IRR TOOMEY STRL 70CC (SYRINGE) ×4 IMPLANT
TUBING CONNECTING 10 (TUBING) ×3 IMPLANT
TUBING CONNECTING 10' (TUBING) ×1

## 2014-11-29 NOTE — Discharge Instructions (Signed)
Foley Catheter Care °A Foley catheter is a soft, flexible tube that is placed into the bladder to drain urine. A Foley catheter may be inserted if: °· You leak urine or are not able to control when you urinate (urinary incontinence). °· You are not able to urinate when you need to (urinary retention). °· You had prostate surgery or surgery on the genitals. °· You have certain medical conditions, such as multiple sclerosis, dementia, or a spinal cord injury. °If you are going home with a Foley catheter in place, follow the instructions below. °TAKING CARE OF THE CATHETER °1. Wash your hands with soap and water. °2. Using mild soap and warm water on a clean washcloth: °· Clean the area on your body closest to the catheter insertion site using a circular motion, moving away from the catheter. Never wipe toward the catheter because this could sweep bacteria up into the urethra and cause infection. °· Remove all traces of soap. Pat the area dry with a clean towel. For males, reposition the foreskin. °3. Attach the catheter to your leg so there is no tension on the catheter. Use adhesive tape or a leg strap. If you are using adhesive tape, remove any sticky residue left behind by the previous tape you used. °4. Keep the drainage bag below the level of the bladder, but keep it off the floor. °5. Check throughout the day to be sure the catheter is working and urine is draining freely. Make sure the tubing does not become kinked. °6. Do not pull on the catheter or try to remove it. Pulling could damage internal tissues. °TAKING CARE OF THE DRAINAGE BAGS °You will be given two drainage bags to take home. One is a large overnight drainage bag, and the other is a smaller leg bag that fits underneath clothing. You may wear the overnight bag at any time, but you should never wear the smaller leg bag at night. Follow the instructions below for how to empty, change, and clean your drainage bags. °Emptying the Drainage Bag °You must  empty your drainage bag when it is  -½ full or at least 2-3 times a day. °1. Wash your hands with soap and water. °2. Keep the drainage bag below your hips, below the level of your bladder. This stops urine from going back into the tubing and into your bladder. °3. Hold the dirty bag over the toilet or a clean container. °4. Open the pour spout at the bottom of the bag and empty the urine into the toilet or container. Do not let the pour spout touch the toilet, container, or any other surface. Doing so can place bacteria on the bag, which can cause an infection. °5. Clean the pour spout with a gauze pad or cotton ball that has rubbing alcohol on it. °6. Close the pour spout. °7. Attach the bag to your leg with adhesive tape or a leg strap. °8. Wash your hands well. °Changing the Drainage Bag °Change your drainage bag once a month or sooner if it starts to smell bad or look dirty. Below are steps to follow when changing the drainage bag. °1. Wash your hands with soap and water. °2. Pinch off the rubber catheter so that urine does not spill out. °3. Disconnect the catheter tube from the drainage tube at the connection valve. Do not let the tubes touch any surface. °4. Clean the end of the catheter tube with an alcohol wipe. Use a different alcohol wipe to clean the   end of the drainage tube. °5. Connect the catheter tube to the drainage tube of the clean drainage bag. °6. Attach the new bag to the leg with adhesive tape or a leg strap. Avoid attaching the new bag too tightly. °7. Wash your hands well. °Cleaning the Drainage Bag °1. Wash your hands with soap and water. °2. Wash the bag in warm, soapy water. °3. Rinse the bag thoroughly with warm water. °4. Fill the bag with a solution of white vinegar and water (1 cup vinegar to 1 qt warm water [.2 L vinegar to 1 L warm water]). Close the bag and soak it for 30 minutes in the solution. °5. Rinse the bag with warm water. °6. Hang the bag to dry with the pour spout open  and hanging downward. °7. Store the clean bag (once it is dry) in a clean plastic bag. °8. Wash your hands well. °PREVENTING INFECTION °· Wash your hands before and after handling your catheter. °· Take showers daily and wash the area where the catheter enters your body. Do not take baths. Replace wet leg straps with dry ones, if this applies. °· Do not use powders, sprays, or lotions on the genital area. Only use creams, lotions, or ointments as directed by your caregiver. °· For females, wipe from front to back after each bowel movement. °· Drink enough fluids to keep your urine clear or pale yellow unless you have a fluid restriction. °· Do not let the drainage bag or tubing touch or lie on the floor. °· Wear cotton underwear to absorb moisture and to keep your skin drier. °SEEK MEDICAL CARE IF:  °· Your urine is cloudy or smells unusually bad. °· Your catheter becomes clogged. °· You are not draining urine into the bag or your bladder feels full. °· Your catheter starts to leak. °SEEK IMMEDIATE MEDICAL CARE IF:  °· You have pain, swelling, redness, or pus where the catheter enters the body. °· You have pain in the abdomen, legs, lower back, or bladder. °· You have a fever. °· You see blood fill the catheter, or your urine is pink or red. °· You have nausea, vomiting, or chills. °· Your catheter gets pulled out. °MAKE SURE YOU:  °· Understand these instructions. °· Will watch your condition. °· Will get help right away if you are not doing well or get worse. °Document Released: 03/08/2005 Document Revised: 07/23/2013 Document Reviewed: 02/28/2012 °ExitCare® Patient Information ©2015 [employer]. This information is not intended to replace advice given to you by your health care provider. Make sure you discuss any questions you have with your health care provider. °Cystoscopy, Care After °Refer to this sheet in the next few weeks. These instructions provide you with information on caring for yourself after your  procedure. Your caregiver may also give you more specific instructions. Your treatment has been planned according to current medical practices, but problems sometimes occur. Call your caregiver if you have any problems or questions after your procedure. °HOME CARE INSTRUCTIONS  °Things you can do to ease any discomfort after your procedure include: °· Drinking enough water and fluids to keep your urine clear or pale yellow. °· Taking a warm bath to relieve any burning feelings. °SEEK IMMEDIATE MEDICAL CARE IF:  °7. You have an increase in blood in your urine. °8. You notice blood clots in your urine. °9. You have difficulty passing urine. °10. You have the chills. °11. You have abdominal pain. °12. You have a fever or persistent   symptoms for more than 2-3 days. °13. You have a fever and your symptoms suddenly get worse. °MAKE SURE YOU:  °9. Understand these instructions. °10. Will watch your condition. °11. Will get help right away if you are not doing well or get worse. °Document Released: 09/25/2004 Document Revised: 11/08/2012 Document Reviewed: 08/30/2011 °ExitCare® Patient Information ©2015 [employer]. This information is not intended to replace advice given to you by your health care provider. Make sure you discuss any questions you have with your health care provider. ° °

## 2014-11-29 NOTE — Anesthesia Procedure Notes (Signed)
Procedure Name: LMA Insertion Date/Time: 11/29/2014 9:17 AM Performed by: Glory Buff Pre-anesthesia Checklist: Patient identified, Emergency Drugs available, Suction available, Patient being monitored and Timeout performed Patient Re-evaluated:Patient Re-evaluated prior to inductionOxygen Delivery Method: Circle system utilized Preoxygenation: Pre-oxygenation with 100% oxygen Intubation Type: IV induction LMA: LMA inserted LMA Size: 4.0 Number of attempts: 1 Placement Confirmation: positive ETCO2 Tube secured with: Tape

## 2014-11-29 NOTE — Interval H&P Note (Signed)
History and Physical Interval Note:  11/29/2014 8:56 AM  [name]  has presented today for surgery, with the diagnosis of BPH BLADDER STONES,NEPHROLITHIASIS  The various methods of treatment have been discussed with the patient and family. After consideration of risks, benefits and other options for treatment, the patient has consented to  Procedure(s): CYSTO TRANSURETHRAL RESECTION OF THE PROSTATE (TURP) (N/A) CYSTOSCOPY WITH LITHOLAPAXY  (N/A) RIGHT  RETROGRADE PYELOGRAM, POSSIBLE URETEROSCOPY, LITHOTRIPSY  AND STENT PLACEMENT (Right) HOLMIUM LASER APPLICATION (Right) as a surgical intervention .  The patient's history has been reviewed, patient examined, no change in status, stable for surgery.  I have reviewed the patient's chart and labs.  Questions were answered to the patient's satisfaction.  He confirms he started the abx/Cipro. He has not passed a stone. Endorses hesitancy and weak stream. No fever or dysuria.    Izela Altier

## 2014-11-29 NOTE — Anesthesia Preprocedure Evaluation (Addendum)
Anesthesia Evaluation  Patient identified by MRN, date of birth, ID band Patient awake    Reviewed: Allergy & Precautions, H&P , NPO status , Patient's Chart, lab work & pertinent test results  Airway Mallampati: II  TM Distance: >3 FB Neck ROM: full    Dental  (+) Dental Advisory Given, Edentulous Upper, Edentulous Lower   Pulmonary COPD, former smoker,    Pulmonary exam normal breath sounds clear to auscultation       Cardiovascular hypertension, Pt. on medications + CAD, + Past MI and + CABG  Normal cardiovascular exam+ dysrhythmias Atrial Fibrillation  Rhythm:regular Rate:Normal  3/15 EF 55%   Neuro/Psych dementia negative neurological ROS  negative psych ROS   GI/Hepatic negative GI ROS, Neg liver ROS,   Endo/Other  negative endocrine ROS  Renal/GU Renal InsufficiencyRenal diseaseCRT 2.0  negative genitourinary   Musculoskeletal   Abdominal   Peds  Hematology negative hematology ROS (+)   Anesthesia Other Findings   Reproductive/Obstetrics negative OB ROS                           Anesthesia Physical Anesthesia Plan  ASA: III  Anesthesia Plan: General   Post-op Pain Management:    Induction: Intravenous  Airway Management Planned: LMA  Additional Equipment:   Intra-op Plan:   Post-operative Plan:   Informed Consent: I have reviewed the patients History and Physical, chart, labs and discussed the procedure including the risks, benefits and alternatives for the proposed anesthesia with the patient or authorized representative who has indicated his/her understanding and acceptance.   Dental Advisory Given  Plan Discussed with: CRNA and Surgeon  Anesthesia Plan Comments:         Anesthesia Quick Evaluation

## 2014-11-29 NOTE — Progress Notes (Signed)
Dr. Landry Dyke  Made aware of patient's vital signs in PACU  And order for discharge .

## 2014-11-29 NOTE — Anesthesia Postprocedure Evaluation (Signed)
  Anesthesia Post-op Note  Patient: [name]  Procedure(s) Performed: Procedure(s) (LRB): CYSTO TRANSURETHRAL RESECTION OF THE PROSTATE (TURP) (N/A) CYSTOSCOPY WITH LITHOLAPAXY  (N/A) RIGHT  RETROGRADE PYELOGRAM, URETEROSCOPY,  AND STENT PLACEMENT (Right) HOLMIUM LASER APPLICATION (Right)  Patient Location: PACU  Anesthesia Type: General  Level of Consciousness: awake and alert   Airway and Oxygen Therapy: Patient Spontanous Breathing  Post-op Pain: mild  Post-op Assessment: Post-op Vital signs reviewed, Patient's Cardiovascular Status Stable, Respiratory Function Stable, Patent Airway and No signs of Nausea or vomiting  Last Vitals:  Filed Vitals:   11/29/14 1332  BP: 111/58  Pulse: 73  Temp:   Resp: 18    Post-op Vital Signs: stable   Complications: No apparent anesthesia complications

## 2014-11-29 NOTE — Op Note (Signed)
Preoperative diagnosis: Gross hematuria, BPH, bladder diverticulum, bladder stone, right UPJ stone, urinary hesitancy, weak stream Postoperative diagnosis: Same  Procedure: Exam under anesthesia, cystoscopy, Right retrograde pyelogram, right ureteroscopy, Cystoscopy litholapaxy less than 2 cm, TURP limited, Right ureteral stent placement  Surgeon: Junious Silk  Anesthesia: Gen.  Indication for procedure: [name] is an 81 year old who underwent CT scan for gross hematuria. 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 He is brought to the operating room for the above procedures.  Findings: On exam under anesthesia the penis was circumcised without mass or lesion. The testicles were descended bilaterally and palpably normal. On digital rectal exam the prostate was enlarged but smooth without hard areas or nodules. All landmarks were preserved.  On cystoscopy the urethra appeared normal. The prostatic urethra was obstructed effusion of the lateral lobes from the mid prostatic urethra down to the apex. These had likely fused as the prostate regrew following his TURP. There is a channel above and below the fusion of these lobes. The bladder neck was widely patent. The bladder appeared normal which evacuation but no mucosal tumors. There was a large right bladder diverticulum which was inspected carefully and the mucosa noted to be smooth and normal. There was some very mild erythema in the base due to the stone. There was a stone in the diverticulum.  Right retrograde pyelogram-this outlined a tortuous ureter above the iliacs. The distal ureter appeared normal.  in the mid proximal ureter there is a filling defect consistent with prior stone in proximal to this some mild hydroureteronephrosis of the remaining ureter and collecting system. The stone was not readily visible on the scout  image.   Description of procedure: After consent was obtained patient brought to the operating room. After adequate anesthesia he is placed in lithotomy position and prepped and draped in the usual sterile fashion. A timeout was performed to confirm the patient and procedure. On cystoscopy the prostatic urethra was obstructed from fused lateral lobes. I could get the scope under the lobes but couldn't angle up into the bladder. A 1 above and found the channel into the bladder. The right ureteral orifice was cannulated with a 6 [place] open-ended catheter and retrograde injection of contrast was performed. The sensor wire was then advanced and coiled in the collecting system. The rigid ureteroscope was advanced but could not navigate the tortuous ureter and when I approached where I thought the stone was located the mucosa was closed. I then backed out the rigid ureteroscope. Over the wire I tried to advance a ureteral access sheath but this would not advance. I did not believe I would be able to negotiate a flexible ureteroscope up after the experience with the rigid. Therefore planned to leave a ureteral stent and perform ureteroscopy in a staged fashion.   the cystoscope was then passed per urethra again the bladder carefully inspected. In the right diverticulum the stone was grasped with a Nitinol basket and pulled out of the diverticulum and dropped in the bladder. With 1000  laser fiber it was fragmented a setting of 0.3-0.5 and a rate of 50. These pieces were flushed out.   Next the continuous-flow sheath was passed with the visual obturator which was swapped out for the resectoscope handle. The loop was then used to resect the right lateral lobe down to the apex and in the left lateral lobe. This cleared the obstructing  tissue in the fusion in the middle. It should be noted the bladder neck was patent and not resected. I was able to see posteriorly where the mucosa was still present indicating the  posterior passage. At the left bladder neck there was a marble size nodular BPH protruding that seemed rather odd. This was resected and it seemed to be normal BPH tissue. The trigone and ureteral orifices were checked periodically and noted to be normal and without any injury. The prostate chips were evacuated. Hemostasis was excellent.   The wire was then backloaded on the cystoscope and a 6 x 26 and a meter stent was advanced. The wire was removed with a good coil seen in the collecting system and a good coil in the bladder. The scope was removed and an 65 [place] coud catheter was advanced without difficulty. The balloon was inflated to 16 mL and seated at the bladder neck. Drainage was clear.   The patient was awakened and taken to the PACU in stable condition. From a surgical standpoint I think he could be discharged to home and I discussed with his daughter who prefers for the patient to go home. He has some dementia and gets confused into environments. She will stay with the patient tonight. We will see how he does in recovery and consider discharge to home.   Complications: None   Blood loss: Minimal   Specimens: Bladder stone and prostate chips to pathology   Drains: Right 6 x 26 cm ureteral stent, 18 French coud Foley catheter

## 2014-11-29 NOTE — Transfer of Care (Signed)
Immediate Anesthesia Transfer of Care Note  Patient: [name]  Procedure(s) Performed: Procedure(s): CYSTO TRANSURETHRAL RESECTION OF THE PROSTATE (TURP) (N/A) CYSTOSCOPY WITH LITHOLAPAXY  (N/A) RIGHT  RETROGRADE PYELOGRAM, URETEROSCOPY,  AND STENT PLACEMENT (Right) HOLMIUM LASER APPLICATION (Right)  Patient Location: PACU  Anesthesia Type:General  Level of Consciousness: awake, alert  and oriented  Airway & Oxygen Therapy: Patient Spontanous Breathing and Patient connected to face mask oxygen  Post-op Assessment: Report given to RN and Post -op Vital signs reviewed and stable  Post vital signs: Reviewed and stable  Last Vitals:  Filed Vitals:   11/29/14 0718  BP: 113/68  Pulse: 73  Temp: 36.1 C  Resp: 18    Complications: No apparent anesthesia complications

## 2014-12-02 ENCOUNTER — Encounter (HOSPITAL_COMMUNITY): Payer: Self-pay | Admitting: Urology

## 2014-12-04 ENCOUNTER — Other Ambulatory Visit: Payer: Self-pay | Admitting: Urology

## 2014-12-04 DIAGNOSIS — N200 Calculus of kidney: Secondary | ICD-10-CM | POA: Diagnosis not present

## 2014-12-04 DIAGNOSIS — N390 Urinary tract infection, site not specified: Secondary | ICD-10-CM | POA: Diagnosis not present

## 2014-12-06 DIAGNOSIS — I2510 Atherosclerotic heart disease of native coronary artery without angina pectoris: Secondary | ICD-10-CM | POA: Diagnosis not present

## 2014-12-06 DIAGNOSIS — Z6822 Body mass index (BMI) 22.0-22.9, adult: Secondary | ICD-10-CM | POA: Diagnosis not present

## 2014-12-06 DIAGNOSIS — E785 Hyperlipidemia, unspecified: Secondary | ICD-10-CM | POA: Diagnosis not present

## 2014-12-06 DIAGNOSIS — Z23 Encounter for immunization: Secondary | ICD-10-CM | POA: Diagnosis not present

## 2014-12-06 DIAGNOSIS — F0391 Unspecified dementia with behavioral disturbance: Secondary | ICD-10-CM | POA: Diagnosis not present

## 2014-12-06 DIAGNOSIS — I10 Essential (primary) hypertension: Secondary | ICD-10-CM | POA: Diagnosis not present

## 2014-12-06 DIAGNOSIS — J449 Chronic obstructive pulmonary disease, unspecified: Secondary | ICD-10-CM | POA: Diagnosis not present

## 2014-12-18 ENCOUNTER — Inpatient Hospital Stay (HOSPITAL_COMMUNITY): Admission: RE | Admit: 2014-12-18 | Payer: Medicare Other | Source: Ambulatory Visit

## 2014-12-20 ENCOUNTER — Encounter (HOSPITAL_COMMUNITY): Payer: Self-pay

## 2014-12-20 ENCOUNTER — Encounter (HOSPITAL_COMMUNITY)
Admission: RE | Admit: 2014-12-20 | Discharge: 2014-12-20 | Disposition: A | Discharge status: Home / Self Care | Payer: Medicare Other | Source: Ambulatory Visit | Attending: Urology | Admitting: Urology

## 2014-12-20 DIAGNOSIS — Z01818 Encounter for other preprocedural examination: Secondary | ICD-10-CM | POA: Diagnosis not present

## 2014-12-20 HISTORY — DX: Cardiac arrhythmia, unspecified: I49.9

## 2014-12-20 HISTORY — DX: Chronic kidney disease, unspecified: N18.9

## 2014-12-20 LAB — CBC
HEMATOCRIT: 38.8 % — AB (ref 39.0–52.0)
HEMOGLOBIN: 12.9 g/dL — AB (ref 13.0–17.0)
MCH: 30.9 pg (ref 26.0–34.0)
MCHC: 33.2 g/dL (ref 30.0–36.0)
MCV: 92.8 fL (ref 78.0–100.0)
Platelets: 190 10*3/uL (ref 150–400)
RBC: 4.18 MIL/uL — ABNORMAL LOW (ref 4.22–5.81)
RDW: 13.9 % (ref 11.5–15.5)
WBC: 4.6 10*3/uL (ref 4.0–10.5)

## 2014-12-20 LAB — BASIC METABOLIC PANEL
Anion gap: 4 — ABNORMAL LOW (ref 5–15)
BUN: 28 mg/dL — ABNORMAL HIGH (ref 6–20)
CHLORIDE: 107 mmol/L (ref 101–111)
CO2: 26 mmol/L (ref 22–32)
CREATININE: 1.95 mg/dL — AB (ref 0.61–1.24)
Calcium: 9.1 mg/dL (ref 8.9–10.3)
GFR calc non Af Amer: 30 mL/min — ABNORMAL LOW (ref >60)
GFR, EST AFRICAN AMERICAN: 35 mL/min — AB (ref >60)
GLUCOSE: 100 mg/dL — AB (ref 65–99)
Potassium: 3.8 mmol/L (ref 3.5–5.1)
Sodium: 137 mmol/L (ref 135–145)

## 2014-12-20 NOTE — Patient Instructions (Addendum)
[name]  12/20/2014   Your procedure is scheduled on: December 24, 2014  Report to [facility]  Entrance take [place]  elevators to 3rd floor to  [place] at 5:00 AM.  Call this number if you have problems the morning of surgery [phone]   Remember: ONLY 1 PERSON MAY GO WITH YOU TO SHORT STAY TO GET  READY MORNING OF [place].  Do not eat food or drink liquids :After Midnight.     Take these medicines the morning of surgery with A SIP OF WATER: Albuteral if needed , Zantac, Atrovent, Qvar inhaler DO NOT TAKE ANY DIABETIC MEDICATIONS DAY OF YOUR SURGERY                               You may not have any metal on your body including hair pins and              piercings  Do not wear jewelry, , lotions, powders or perfumes, deodorant        .              Men may shave face and neck.   Do not bring valuables to the hospital[place].  Contacts, dentures or bridgework may not be worn into surgery.      Patients discharged the day of surgery will not be allowed to drive home.  Name and phone number of your driver:  Special Instructions: coughing and deep breathing exercises, leg exercises              Please read over the following fact sheets you were given: _____________________________________________________________________             [facility] - Preparing for Surgery Before surgery, you can play an important role.  Because skin is not sterile, your skin needs to be as free of germs as possible.  You can reduce the number of germs on your skin by washing with CHG (chlorahexidine gluconate) soap before surgery.  CHG is an antiseptic cleaner which kills germs and bonds with the skin to continue killing germs even after washing. Please DO NOT use if you have an allergy to CHG or antibacterial soaps.  If your skin becomes reddened/irritated stop using the CHG and inform your nurse when  you arrive at Short Stay. Do not shave (including legs and underarms) for at least 48 hours prior to the first CHG shower.  You may shave your face/neck. Please follow these instructions carefully:  1.  Shower with CHG Soap the night before surgery and the  morning of Surgery.  2.  If you choose to wash your hair, wash your hair first as usual with your  normal  shampoo.  3.  After you shampoo, rinse your hair and body thoroughly to remove the  shampoo.                           4.  Use CHG as you would any other liquid soap.  You can apply chg directly  to the skin and wash  Gently with a scrungie or clean washcloth.  5.  Apply the CHG Soap to your body ONLY FROM THE NECK DOWN.   Do not use on face/ open                           Wound or open sores. Avoid contact with eyes, ears mouth and genitals (private parts).                       Wash face,  Genitals (private parts) with your normal soap.             6.  Wash thoroughly, paying special attention to the area where your surgery  will be performed.  7.  Thoroughly rinse your body with warm water from the neck down.  8.  DO NOT shower/wash with your normal soap after using and rinsing off  the CHG Soap.                9.  Pat yourself dry with a clean towel.            10.  Wear clean pajamas.            11.  Place clean sheets on your bed the night of your first shower and do not  sleep with pets. Day of Surgery : Do not apply any lotions/deodorants the morning of surgery.  Please wear clean clothes to the hospital/surgery center.  FAILURE TO FOLLOW THESE INSTRUCTIONS MAY RESULT IN THE CANCELLATION OF YOUR SURGERY PATIENT SIGNATURE_________________________________  NURSE SIGNATURE__________________________________  ________________________________________________________________________

## 2014-12-20 NOTE — Progress Notes (Addendum)
  11-26-14 - BMP (abn.) - EPIC 10-07-14 - LOV Dr. Stanford Breed (cardiac) - cardiac clearance - EPIC 09-10-14 - EKG - EPIC  06-07-13 - Echo - EPIV 06-05-13 - 1V CXR - Epic  04-22-11 - Stress Test - EPIC

## 2014-12-20 NOTE — Progress Notes (Signed)
12-20-14 - BMP lab results from  preop visit on 12-20-14 faxed to Dr. Junious Silk via [facility]

## 2014-12-23 NOTE — H&P (Signed)
History of Present Illness Patient is status post right ureteral stent placement for a right UPJ stone, removal of bladder stone and TURP for residual or regrowth. He presents today for voiding trial. Since coming home from the hospital, he has done well with minimal flank and back pain. He endorses his catheter to be draining well with minimal gross hematuria. He denies fever or other signs and symptoms of infection.   Vitals Vital Signs [Data Includes: Last 1 Day]  Recorded: 14Sep2016 10:59AM  Height: 5 ft 11 in Weight: 156 lb  BMI Calculated: 21.76 BSA Calculated: 1.9 Blood Pressure: 111 / 69 Temperature: 97.9 F Heart Rate: 83  Physical Exam Constitutional: Well nourished and well developed . No acute distress.  Abdomen: The abdomen is soft and nontender. No masses are palpated. No CVA tenderness. No hernias are palpable. No hepatosplenomegaly noted.  Genitourinary: Examination of the penis demonstrates an indwelling catheter, but no discharge, no masses, no lesions and a normal meatus. The scrotum is without lesions. The right epididymis is palpably normal and non-tender. The left epididymis is palpably normal and non-tender. The right testis is non-tender and without masses. The left testis is non-tender and without masses.    Results/Data Urine [Data Includes: Last 1 Day]   14Sep2016  COLOR YELLOW   APPEARANCE CLEAR   SPECIFIC GRAVITY <1.005   pH 5.5   GLUCOSE NEGATIVE   BILIRUBIN NEGATIVE   KETONE NEGATIVE   BLOOD 3+   PROTEIN TRACE   NITRITE NEGATIVE   LEUKOCYTE ESTERASE 2+   SQUAMOUS EPITHELIAL/HPF 0-5 HPF  WBC 6-10 WBC/HPF  RBC 3-10 RBC/HPF  BACTERIA FEW HPF  CRYSTALS See Below HPF  CASTS NONE SEEN LPF  Yeast NONE SEEN HPF   The following clinical lab reports were reviewed:  Urinalysis with 6-10 white blood cells per high-powered field, 3-10 red blood cells per high-power field, and few bacteria. Sent for culture.    Procedure Approximately 240 cc sterile water  was instilled into the patient's bladder before the sensation to void was felt minimally with bladder spasm. The catheter was then removed and the patient voided was given ample time to void. They then voided approximately 150 cc of clear yellow fluid. Overall he tolerated the procedure well.   Assessment Assessed  1. Calculus of right kidney (N20.0)  Plan Calculus of right kidney  1. Follow-up Schedule Surgery Office  Follow-up  Status: Hold For - Appointment   Requested for: 14Sep2016 Health Maintenance  2. UA With REFLEX; [Do Not Release]; Status:Complete;   Done: [id number] 12:12PM Urinary retention  3. Fill, Pull; Status:Hold For - Appointment,Date of Service; Requested for:14Sep2016;  Urinary tract infection  4. URINE CULTURE; Status:In Progress - Specimen/Data Collected;   Done: [id number]  Discussion/Summary I'm pleased that he passed his voiding trial. I educated him and his wife on the importance of timed double voiding especially in the next few days. He understands that he may have some additional gross hematuria dysuria, dysuria, bladder spasms, and intermittent hypotension continued right flank or back pain. He has adequate analgesia. He understands to follow-up sooner if he experiences any uncontrollable pain, inability to urinate, or fever. I will schedule him for his next procedure to have his stent exchanged and right ureteral stone removed. Urine sent for culture today. I will follow-up with results.   Reason For Visit He can undergo a voiding trial when he returns. Send a urine for culture. He needs to be posted for right ureteroscopy, holmium laser lithotripsy and  stent placement in 1 - 2. weeks.     Also, he had a normal DRE/EUA.   Signatures Electronically signed by : Jiles Crocker, ANP-C; Dec 05 2014  7:10AM EST  Add: urine cx negative.

## 2014-12-23 NOTE — Anesthesia Preprocedure Evaluation (Addendum)
Anesthesia Evaluation  Patient identified by MRN, date of birth, ID band Patient awake    Reviewed: Allergy & Precautions, NPO status , Patient's Chart, lab work & pertinent test results  Airway Mallampati: II  TM Distance: >3 FB Neck ROM: Full    Dental  (+) Edentulous Upper, Edentulous Lower   Pulmonary COPD, former smoker,    breath sounds clear to auscultation       Cardiovascular hypertension, + CAD, + Past MI, + CABG and + Peripheral Vascular Disease  + dysrhythmias Atrial Fibrillation  Rhythm:Regular Rate:Normal     Neuro/Psych PSYCHIATRIC DISORDERS    GI/Hepatic PUD, GERD  Medicated,  Endo/Other    Renal/GU CRFRenal disease  negative genitourinary   Musculoskeletal  (+) Arthritis , Osteoarthritis,    Abdominal   Peds  Hematology   Anesthesia Other Findings   Reproductive/Obstetrics                           Lab Results  Component Value Date   WBC 4.6 12/20/2014   HGB 12.9* 12/20/2014   HCT 38.8* 12/20/2014   MCV 92.8 12/20/2014   PLT 190 12/20/2014   Lab Results  Component Value Date   CREATININE 1.95* 12/20/2014   BUN 28* 12/20/2014   NA 137 12/20/2014   K 3.8 12/20/2014   CL 107 12/20/2014   CO2 26 12/20/2014   Lab Results  Component Value Date   INR 1.05 06/05/2013   INR 1.0 06/21/2007   EKG: normal sinus rhythm.  2015: Echo  - Left ventricle: The cavity size was normal. Systolic function was normal. The estimated ejection fraction was in the range of 55% to 60%. Wall motion was normal; there were no regional wall motion abnormalities. There was an increased relative contribution of atrial contraction to ventricular filling. Doppler parameters are consistent with abnormal left ventricular relaxation (grade 1 diastolic dysfunction). - Left atrium: The atrium was mildly dilated. - Pulmonary arteries: PA peak pressure: 69mm Hg (S).  Anesthesia  Physical Anesthesia Plan  ASA: III  Anesthesia Plan: General   Post-op Pain Management:    Induction: Intravenous  Airway Management Planned: Oral ETT  Additional Equipment:   Intra-op Plan:   Post-operative Plan: Extubation in OR  Informed Consent: I have reviewed the patients History and Physical, chart, labs and discussed the procedure including the risks, benefits and alternatives for the proposed anesthesia with the patient or authorized representative who has indicated his/her understanding and acceptance.   Dental advisory given  Plan Discussed with: CRNA  Anesthesia Plan Comments:         Anesthesia Quick Evaluation

## 2014-12-24 ENCOUNTER — Ambulatory Visit (HOSPITAL_COMMUNITY): Payer: Medicare Other | Admitting: Anesthesiology

## 2014-12-24 ENCOUNTER — Encounter (HOSPITAL_COMMUNITY): Payer: Self-pay | Admitting: *Deleted

## 2014-12-24 ENCOUNTER — Ambulatory Visit (HOSPITAL_BASED_OUTPATIENT_CLINIC_OR_DEPARTMENT_OTHER)
Admission: RE | Admit: 2014-12-24 | Discharge: 2014-12-24 | Disposition: A | Discharge status: Home / Self Care | Payer: Medicare Other | Source: Ambulatory Visit | Attending: Urology | Admitting: Urology

## 2014-12-24 ENCOUNTER — Encounter (HOSPITAL_COMMUNITY)
Admission: RE | Disposition: A | Discharge status: Home / Self Care | Payer: Self-pay | Source: Ambulatory Visit | Attending: Urology

## 2014-12-24 DIAGNOSIS — I2510 Atherosclerotic heart disease of native coronary artery without angina pectoris: Secondary | ICD-10-CM | POA: Insufficient documentation

## 2014-12-24 DIAGNOSIS — I739 Peripheral vascular disease, unspecified: Secondary | ICD-10-CM | POA: Insufficient documentation

## 2014-12-24 DIAGNOSIS — I252 Old myocardial infarction: Secondary | ICD-10-CM | POA: Diagnosis not present

## 2014-12-24 DIAGNOSIS — I10 Essential (primary) hypertension: Secondary | ICD-10-CM | POA: Insufficient documentation

## 2014-12-24 DIAGNOSIS — Z87891 Personal history of nicotine dependence: Secondary | ICD-10-CM | POA: Diagnosis not present

## 2014-12-24 DIAGNOSIS — K219 Gastro-esophageal reflux disease without esophagitis: Secondary | ICD-10-CM | POA: Insufficient documentation

## 2014-12-24 DIAGNOSIS — J449 Chronic obstructive pulmonary disease, unspecified: Secondary | ICD-10-CM | POA: Diagnosis not present

## 2014-12-24 DIAGNOSIS — M1990 Unspecified osteoarthritis, unspecified site: Secondary | ICD-10-CM | POA: Insufficient documentation

## 2014-12-24 DIAGNOSIS — N201 Calculus of ureter: Secondary | ICD-10-CM | POA: Insufficient documentation

## 2014-12-24 DIAGNOSIS — I4891 Unspecified atrial fibrillation: Secondary | ICD-10-CM | POA: Diagnosis not present

## 2014-12-24 DIAGNOSIS — Z951 Presence of aortocoronary bypass graft: Secondary | ICD-10-CM | POA: Insufficient documentation

## 2014-12-24 DIAGNOSIS — N200 Calculus of kidney: Secondary | ICD-10-CM | POA: Diagnosis present

## 2014-12-24 HISTORY — PX: CYSTOSCOPY WITH URETEROSCOPY, STONE BASKETRY AND STENT PLACEMENT: SHX6378

## 2014-12-24 HISTORY — PX: HOLMIUM LASER APPLICATION: SHX5852

## 2014-12-24 SURGERY — CYSTOSCOPY, WITH CALCULUS MANIPULATION OR REMOVAL
Anesthesia: General | Site: Ureter | Laterality: Right

## 2014-12-24 MED ORDER — LIDOCAINE HCL (CARDIAC) 20 MG/ML IV SOLN
INTRAVENOUS | Status: AC
  Filled 2014-12-24: qty 5

## 2014-12-24 MED ORDER — METHYLENE BLUE 1 % INJ SOLN
INTRAMUSCULAR | Status: AC
  Filled 2014-12-24: qty 10

## 2014-12-24 MED ORDER — FENTANYL CITRATE (PF) 100 MCG/2ML IJ SOLN: 25.0000 ug | INTRAMUSCULAR | Status: DC | PRN

## 2014-12-24 MED ORDER — FENTANYL CITRATE (PF) 100 MCG/2ML IJ SOLN
INTRAMUSCULAR | Status: AC
  Filled 2014-12-24: qty 4

## 2014-12-24 MED ORDER — DEXAMETHASONE SODIUM PHOSPHATE 10 MG/ML IJ SOLN
INTRAMUSCULAR | Status: AC
  Filled 2014-12-24: qty 1

## 2014-12-24 MED ORDER — LACTATED RINGERS IV SOLN: INTRAVENOUS | Status: DC

## 2014-12-24 MED ORDER — SODIUM CHLORIDE 0.9 % IR SOLN
Status: DC | PRN
Start: 2014-12-24 — End: 2014-12-24
  Administered 2014-12-24 (×2): 1000 mL
  Administered 2014-12-24 (×2): 3000 mL

## 2014-12-24 MED ORDER — BELLADONNA ALKALOIDS-OPIUM 16.2-60 MG RE SUPP
RECTAL | Status: AC
  Filled 2014-12-24: qty 1

## 2014-12-24 MED ORDER — ACETAMINOPHEN 10 MG/ML IV SOLN
INTRAVENOUS | Status: AC
  Filled 2014-12-24: qty 100

## 2014-12-24 MED ORDER — PROMETHAZINE HCL 25 MG/ML IJ SOLN: 6.2500 mg | INTRAMUSCULAR | Status: DC | PRN

## 2014-12-24 MED ORDER — LIDOCAINE HCL 2 % EX GEL
CUTANEOUS | Status: AC
  Filled 2014-12-24: qty 5

## 2014-12-24 MED ORDER — CIPROFLOXACIN IN D5W 400 MG/200ML IV SOLN
400.0000 mg | INTRAVENOUS | Status: AC
  Administered 2014-12-24: 400 mg via INTRAVENOUS

## 2014-12-24 MED ORDER — MEPERIDINE HCL 50 MG/ML IJ SOLN: 6.2500 mg | INTRAMUSCULAR | Status: DC | PRN

## 2014-12-24 MED ORDER — KETOROLAC TROMETHAMINE 30 MG/ML IJ SOLN
INTRAMUSCULAR | Status: DC | PRN
Start: 2014-12-24 — End: 2014-12-24
  Administered 2014-12-24: 30 mg via INTRAVENOUS

## 2014-12-24 MED ORDER — HYDROMORPHONE HCL 1 MG/ML IJ SOLN
0.2500 mg | INTRAMUSCULAR | Status: DC | PRN
Start: 2014-12-24 — End: 2014-12-24

## 2014-12-24 MED ORDER — LACTATED RINGERS IV SOLN
INTRAVENOUS | Status: DC | PRN
  Administered 2014-12-24 (×2): via INTRAVENOUS

## 2014-12-24 MED ORDER — ONDANSETRON HCL 4 MG/2ML IJ SOLN
INTRAMUSCULAR | Status: AC
  Filled 2014-12-24: qty 2

## 2014-12-24 MED ORDER — CIPROFLOXACIN IN D5W 400 MG/200ML IV SOLN
INTRAVENOUS | Status: AC
  Filled 2014-12-24: qty 200

## 2014-12-24 MED ORDER — CIPROFLOXACIN HCL 500 MG PO TABS: 500.0000 mg | ORAL_TABLET | Freq: Every day | ORAL | Status: DC

## 2014-12-24 MED ORDER — ACETAMINOPHEN 10 MG/ML IV SOLN
INTRAVENOUS | Status: DC | PRN
  Administered 2014-12-24: 1000 mg via INTRAVENOUS

## 2014-12-24 MED ORDER — FENTANYL CITRATE (PF) 100 MCG/2ML IJ SOLN
INTRAMUSCULAR | Status: DC | PRN
  Administered 2014-12-24 (×4): 12.5 ug via INTRAVENOUS
  Administered 2014-12-24: 25 ug via INTRAVENOUS
  Administered 2014-12-24 (×2): 12.5 ug via INTRAVENOUS

## 2014-12-24 MED ORDER — KETOROLAC TROMETHAMINE 30 MG/ML IJ SOLN
INTRAMUSCULAR | Status: AC
  Filled 2014-12-24: qty 1

## 2014-12-24 MED ORDER — LIDOCAINE HCL (CARDIAC) 20 MG/ML IV SOLN
INTRAVENOUS | Status: DC | PRN
  Administered 2014-12-24: 60 mg via INTRAVENOUS

## 2014-12-24 MED ORDER — ONDANSETRON HCL 4 MG/2ML IJ SOLN
INTRAMUSCULAR | Status: DC | PRN
  Administered 2014-12-24: 4 mg via INTRAVENOUS

## 2014-12-24 MED ORDER — DEXAMETHASONE SODIUM PHOSPHATE 4 MG/ML IJ SOLN
INTRAMUSCULAR | Status: DC | PRN
  Administered 2014-12-24: 4 mg via INTRAVENOUS

## 2014-12-24 MED ORDER — SUCCINYLCHOLINE CHLORIDE 20 MG/ML IJ SOLN
INTRAMUSCULAR | Status: DC | PRN
  Administered 2014-12-24: 100 mg via INTRAVENOUS

## 2014-12-24 MED ORDER — EPHEDRINE SULFATE 50 MG/ML IJ SOLN
INTRAMUSCULAR | Status: DC | PRN
  Administered 2014-12-24 (×3): 10 mg via INTRAVENOUS

## 2014-12-24 MED ORDER — PROPOFOL 10 MG/ML IV BOLUS
INTRAVENOUS | Status: DC | PRN
  Administered 2014-12-24: 150 mg via INTRAVENOUS
  Administered 2014-12-24: 40 mg via INTRAVENOUS

## 2014-12-24 MED ORDER — EPHEDRINE SULFATE 50 MG/ML IJ SOLN
INTRAMUSCULAR | Status: AC
  Filled 2014-12-24: qty 1

## 2014-12-24 MED ORDER — PROPOFOL 10 MG/ML IV BOLUS
INTRAVENOUS | Status: AC
  Filled 2014-12-24: qty 20

## 2014-12-24 SURGICAL SUPPLY — 24 items
BAG URO CATCHER STRL LF (DRAPE) ×3 IMPLANT
BASKET ZERO TIP NITINOL 2.4FR (BASKET) ×3 IMPLANT
CATH INTERMIT  6FR 70CM (CATHETERS) ×3 IMPLANT
CATH URET 5FR 28IN CONE TIP (BALLOONS)
CATH URET 5FR 70CM CONE TIP (BALLOONS) IMPLANT
CLOTH BEACON ORANGE TIMEOUT ST (SAFETY) IMPLANT
FIBER LASER FLEXIVA 1000 (UROLOGICAL SUPPLIES) IMPLANT
FIBER LASER FLEXIVA 200 (UROLOGICAL SUPPLIES) ×3 IMPLANT
FIBER LASER FLEXIVA 365 (UROLOGICAL SUPPLIES) IMPLANT
FIBER LASER FLEXIVA 550 (UROLOGICAL SUPPLIES) IMPLANT
FIBER LASER TRAC TIP (UROLOGICAL SUPPLIES) ×3 IMPLANT
GLOVE BIO SURGEON STRL SZ7.5 (GLOVE) ×3 IMPLANT
GLOVE SURG SS PI 7.0 STRL IVOR (GLOVE) ×3 IMPLANT
GOWN STRL REUS W/TWL XL LVL3 (GOWN DISPOSABLE) ×6 IMPLANT
GUIDEWIRE ANG ZIPWIRE 038X150 (WIRE) ×3 IMPLANT
GUIDEWIRE STR DUAL SENSOR (WIRE) ×3 IMPLANT
MANIFOLD NEPTUNE II (INSTRUMENTS) ×3 IMPLANT
PACK CYSTO (CUSTOM PROCEDURE TRAY) ×3 IMPLANT
SHEATH ACCESS URETERAL 24CM (SHEATH) IMPLANT
SHEATH ACCESS URETERAL 38CM (SHEATH) ×3 IMPLANT
STENT CONTOUR 6FRX26X.038 (STENTS) ×3 IMPLANT
TUBING CONNECTING 10 (TUBING) ×2 IMPLANT
TUBING CONNECTING 10' (TUBING) ×1
WIRE COONS/BENSON .038X145CM (WIRE) IMPLANT

## 2014-12-24 NOTE — Discharge Instructions (Signed)
Ureteral Stent Implantation, Care After Refer to this sheet in the next few weeks. These instructions provide you with information on caring for yourself after your procedure. Your health care provider may also give you more specific instructions. Your treatment has been planned according to current medical practices, but problems sometimes occur. Call your health care provider if you have any problems or questions after your procedure. WHAT TO EXPECT AFTER THE PROCEDURE You should be back to normal activity within 48 hours after the procedure. Nausea and vomiting may occur and are commonly the result of anesthesia. It is common to experience sharp pain in the back or lower abdomen and penis with voiding. This is caused by movement of the ends of the stent with the act of urinating.It usually goes away within minutes after you have stopped urinating. HOME CARE INSTRUCTIONS Make sure to drink plenty of fluids. You may have small amounts of bleeding, causing your urine to be red. This is normal. Certain movements may trigger pain or a feeling that you need to urinate. You may be given medicines to prevent infection or bladder spasms. Be sure to take all medicines as directed. Only take over-the-counter or prescription medicines for pain, discomfort, or fever as directed by your health care provider. Do not take aspirin, as this can make bleeding worse.  REMOVAL OF THE STENT: remove the stent by pulling the string with slow steady pressure Monday morning, Dec 30, 2014.   Be sure to keep all follow-up appointments so your health care provider can check that you are healing properly.  SEEK MEDICAL CARE IF:  You experience increasing pain.  Your pain medicine is not working. SEEK IMMEDIATE MEDICAL CARE IF:  Your urine is dark red or has blood clots.  You are leaking urine (incontinent).  You have a fever, chills, feeling sick to your stomach (nausea), or vomiting.  Your pain is not relieved by  pain medicine.  The end of the stent comes out of the urethra.  You are unable to urinate. Document Released: 11/08/2012 Document Revised: 03/13/2013 Document Reviewed: 11/08/2012 [facility] Patient Information 2015 [place], [place]. This information is not intended to replace advice given to you by your health care provider. Make sure you discuss any questions you have with your health care provider.

## 2014-12-24 NOTE — Anesthesia Postprocedure Evaluation (Signed)
  Anesthesia Post-op Note  Patient: [name]  Procedure(s) Performed: Procedure(s): CYSTOSCOPY WITH RIGHT URETEROSCOPY, WITH BASKETRY EXTRACTION AND STENT PLACEMENT/EXCHANGE (Right) HOLMIUM LASER LITHROTRIPSY  (Right)  Patient Location: PACU  Anesthesia Type:General  Level of Consciousness: awake, alert  and oriented  Airway and Oxygen Therapy: Patient Spontanous Breathing and Patient connected to nasal cannula oxygen  Post-op Pain: none  Post-op Assessment: Post-op Vital signs reviewed and Patient's Cardiovascular Status Stable              Post-op Vital Signs: Reviewed and stable  Last Vitals:  Filed Vitals:   12/24/14 1300  BP: 118/58  Pulse: 90  Temp: 36.3 C  Resp: 18    Complications: No apparent anesthesia complications

## 2014-12-24 NOTE — Op Note (Signed)
Preoperative diagnosis: Right ureteral stone, renal stones Postoperative diagnosis: Right ureteral stone  Procedure: Cystoscopy, right ureteroscopy, holmium laser lithotripsy, stone basket extraction, right ureteral stent exchange  Surgeon: Junious Silk  Anesthesia: Gen.  Indication for procedure: 81 year old with right proximal ureteral stone and tortuous ureter there is status post pre-stenting.  Findings: On cystoscopy the urethra was normal. The prostatic urethra had an excellent channel and was healing well from his recent TURP. The bladder appeared normal with diverticula but no stone or tumor.  On ureteroscopy the ureter up near the stone remained quite tortuous due to stone impaction. After the stone was loosened with the laser more proximal access was easier. There was quite a bit of edema around the stone impaction site. The stone was visible on the scout image but faint. It was also not very dense and fragmented quickly.  On renoscopy I did not find any stones in the collecting system although a couple of small stones were seen on the CT images.  Description of procedure: After consent was obtained patient brought to the operating room. After adequate anesthesia he is placed in lithotomy position and prepped and draped in the usual sterile fashion. The cystoscope was passed per urethra and the bladder irrigated several times. The right stent was grasped and removed through the urethral meatus. A sensor wire was advanced and coiled in the kidney.  6 [place] open-ended catheter was placed per urethra to drain the bladder. A semirigid ureteroscope was advanced and was careful negotiation the stone was approached. A 200  laser fiber was deployed and and a setting of 0.5 and 20 the stone was fragmented. 2 of the largest pieces were grasped and removed intact without difficulty. No other fragments were seen. On the CT scan there were 2 small stones in the kidney and therefore I passed a Glidewire  into the collecting system.  the 6 [place] open-ended catheter was removed. Over the Glidewire ureteral access sheath was advanced without difficulty. The collecting system was inspected 2 or 3 times. Visualization was excellent. I did not appreciate any stones in the collecting system. There was some dust and small fragments from the lithotripsy the ureteral stone. Nothing clinically significant. The access sheath and the ureteroscope were backed out together and the ureter noted to be stone free without injury. Still quite a bit of edema and erythema at the stone impaction site.The wire was backloaded on the cystoscope and a 6 x 26 and a meter stent was advanced. The wire was removed with a good coil reconstituting in the upper pole collecting system and a good coil in the bladder. The bladder was drained and the scope removed. The patient was awakened and taken to the recovery room in stable condition.  Complications: None  Blood loss: Minimal  Specimens: Stone fragments to office lab  Drains: 6 x 26 cm right ureteral stent

## 2014-12-24 NOTE — Anesthesia Procedure Notes (Signed)
Procedure Name: Intubation Date/Time: 12/24/2014 7:38 AM Performed by: Justice Rocher Pre-anesthesia Checklist: Patient identified, Emergency Drugs available, Suction available and Patient being monitored Patient Re-evaluated:Patient Re-evaluated prior to inductionOxygen Delivery Method: Circle System Utilized Preoxygenation: Pre-oxygenation with 100% oxygen Intubation Type: IV induction Ventilation: Mask ventilation without difficulty Laryngoscope Size: Mac and 4 Grade View: Grade II Tube type: Oral Tube size: 8.0 mm Number of attempts: 1 Airway Equipment and Method: Stylet and Oral airway Placement Confirmation: ETT inserted through vocal cords under direct vision,  positive ETCO2 and breath sounds checked- equal and bilateral Secured at: 22 cm Tube secured with: Tape Dental Injury: Teeth and Oropharynx as per pre-operative assessment

## 2014-12-24 NOTE — Interval H&P Note (Signed)
History and Physical Interval Note:  12/24/2014 7:24 AM  [name]  has presented today for surgery, with the diagnosis of RIGHT UPJ STONE     The various methods of treatment have been discussed with the patient and family. After consideration of risks, benefits and other options for treatment, the patient has consented to  Procedure(s): CYSTOSCOPY WITH RIGHT URETEROSCOPY, WITH BASKETRY EXTRACTION AND STENT PLACEMENT/EXCHANGE (Right) HOLMIUM LASER LITHROTRIPSY  (Right) as a surgical intervention .  The patient's history has been reviewed, patient examined, no change in status, stable for surgery.  I have reviewed the patient's chart and labs.  Questions were answered to the patient's satisfaction.  He has been well. No fever or dysuria.    Masae Lukacs

## 2014-12-24 NOTE — Progress Notes (Signed)
Dr. Smith Robert made aware of patient's blood pressures in PACU  And SA02s- O.K. To got to Short Stay

## 2014-12-24 NOTE — Transfer of Care (Signed)
Immediate Anesthesia Transfer of Care Note  Patient: [name]  Procedure(s) Performed: Procedure(s) (LRB): CYSTOSCOPY WITH RIGHT URETEROSCOPY, WITH BASKETRY EXTRACTION AND STENT PLACEMENT/EXCHANGE (Right) HOLMIUM LASER LITHROTRIPSY  (Right)  Patient Location: PACU  Anesthesia Type: General  Level of Consciousness: awake, sedated, patient cooperative and responds to stimulation  Airway & Oxygen Therapy: Patient Spontanous Breathing and Patient connected to face mask oxygen  Post-op Assessment: Report given to PACU RN, Post -op Vital signs reviewed and stable and Patient moving all extremities  Post vital signs: Reviewed and stable  Complications: No apparent anesthesia complications

## 2014-12-26 DIAGNOSIS — N200 Calculus of kidney: Secondary | ICD-10-CM | POA: Diagnosis not present

## 2014-12-31 DIAGNOSIS — N401 Enlarged prostate with lower urinary tract symptoms: Secondary | ICD-10-CM | POA: Diagnosis not present

## 2014-12-31 DIAGNOSIS — N390 Urinary tract infection, site not specified: Secondary | ICD-10-CM | POA: Diagnosis not present

## 2014-12-31 DIAGNOSIS — N138 Other obstructive and reflux uropathy: Secondary | ICD-10-CM | POA: Diagnosis not present

## 2015-02-27 DIAGNOSIS — N390 Urinary tract infection, site not specified: Secondary | ICD-10-CM | POA: Diagnosis not present

## 2015-02-27 DIAGNOSIS — N138 Other obstructive and reflux uropathy: Secondary | ICD-10-CM | POA: Diagnosis not present

## 2015-02-27 DIAGNOSIS — N401 Enlarged prostate with lower urinary tract symptoms: Secondary | ICD-10-CM | POA: Diagnosis not present

## 2015-04-11 DIAGNOSIS — F0391 Unspecified dementia with behavioral disturbance: Secondary | ICD-10-CM | POA: Diagnosis not present

## 2015-04-11 DIAGNOSIS — Z6823 Body mass index (BMI) 23.0-23.9, adult: Secondary | ICD-10-CM | POA: Diagnosis not present

## 2015-04-11 DIAGNOSIS — J449 Chronic obstructive pulmonary disease, unspecified: Secondary | ICD-10-CM | POA: Diagnosis not present

## 2015-04-11 DIAGNOSIS — N183 Chronic kidney disease, stage 3 (moderate): Secondary | ICD-10-CM | POA: Diagnosis not present

## 2015-04-11 DIAGNOSIS — I10 Essential (primary) hypertension: Secondary | ICD-10-CM | POA: Diagnosis not present

## 2015-04-11 DIAGNOSIS — I2510 Atherosclerotic heart disease of native coronary artery without angina pectoris: Secondary | ICD-10-CM | POA: Diagnosis not present

## 2015-04-11 DIAGNOSIS — E785 Hyperlipidemia, unspecified: Secondary | ICD-10-CM | POA: Diagnosis not present

## 2015-05-07 DIAGNOSIS — N138 Other obstructive and reflux uropathy: Secondary | ICD-10-CM | POA: Diagnosis not present

## 2015-05-07 DIAGNOSIS — N401 Enlarged prostate with lower urinary tract symptoms: Secondary | ICD-10-CM | POA: Diagnosis not present

## 2015-05-07 DIAGNOSIS — N323 Diverticulum of bladder: Secondary | ICD-10-CM | POA: Diagnosis not present

## 2015-08-22 DIAGNOSIS — N183 Chronic kidney disease, stage 3 (moderate): Secondary | ICD-10-CM | POA: Diagnosis not present

## 2015-08-22 DIAGNOSIS — I2510 Atherosclerotic heart disease of native coronary artery without angina pectoris: Secondary | ICD-10-CM | POA: Diagnosis not present

## 2015-08-22 DIAGNOSIS — J449 Chronic obstructive pulmonary disease, unspecified: Secondary | ICD-10-CM | POA: Diagnosis not present

## 2015-08-22 DIAGNOSIS — E785 Hyperlipidemia, unspecified: Secondary | ICD-10-CM | POA: Diagnosis not present

## 2015-08-22 DIAGNOSIS — I10 Essential (primary) hypertension: Secondary | ICD-10-CM | POA: Diagnosis not present

## 2015-08-22 DIAGNOSIS — N401 Enlarged prostate with lower urinary tract symptoms: Secondary | ICD-10-CM | POA: Diagnosis not present

## 2015-10-07 NOTE — Progress Notes (Signed)
HPI: FU coronary artery disease. He is status post bypass grafting in 1988 and 1999. He had an inferior infarct and underwent stenting of the vein graft to the right coronary artery. Abdominal CT in 2000 showed no aneurysm. Carotid Dopplers in Dec 2013 showed 0-39% stenosis bilaterally. ABIs in November of 2011 were normal. Cardiac catheterization in March of 2015 showed severe three-vessel coronary disease with occlusion of all native vessels. The LIMA to the LAD, saphenous vein graft to the acute marginal and saphenous vein graft to the PDA were patent. LV function normal. There was note of a tight lesion in a small diagonal branch and PCI could be considered for refractory symptoms. Echocardiogram in March 2015 showed normal LV function, grade 1 diastolic dysfunction and mild left atrial enlargement. Since last seen, Patient has some dyspnea with heat but otherwise denies. He denies exertional chest pain or syncope.  Current Outpatient Prescriptions  Medication Sig Dispense Refill  . acetaminophen (TYLENOL) 500 MG tablet Take 1,000 mg by mouth every 6 (six) hours as needed for mild pain, moderate pain, fever or headache.    . albuterol (PROVENTIL) (2.5 MG/3ML) 0.083% nebulizer solution Take 2.5 mg by nebulization every 6 (six) hours as needed for wheezing or shortness of breath.     . Artificial Tear Ointment (DRY EYES OP) Place 2 drops into both eyes daily as needed (for dry eyes).    Marland Kitchen aspirin EC 81 MG tablet Take 81 mg by mouth at bedtime.    . beclomethasone (QVAR) 80 MCG/ACT inhaler Inhale 2 puffs into the lungs daily as needed (for shortness of breath).     . ezetimibe-simvastatin (VYTORIN) 10-40 MG per tablet Take 1 tablet by mouth at bedtime.     . fenofibrate 160 MG tablet Take 160 mg by mouth at bedtime.     . finasteride (PROSCAR) 5 MG tablet Take 5 mg by mouth every evening.    . fish oil-omega-3 fatty acids 1000 MG capsule Take 4 g by mouth 2 (two) times daily.     Marland Kitchen  ipratropium (ATROVENT) 0.02 % nebulizer solution Take 500 mcg by nebulization every 6 (six) hours as needed for shortness of breath.     . niacin 500 MG tablet Take 1,000 mg by mouth at bedtime.     . ramipril (ALTACE) 10 MG capsule Take 10 mg by mouth daily with breakfast.     . ranitidine (ZANTAC) 150 MG tablet Take 300 mg by mouth 2 (two) times daily.     . tamsulosin (FLOMAX) 0.4 MG CAPS capsule Take 0.4 mg by mouth daily after supper.      No current facility-administered medications for this visit.     Past Medical History  Diagnosis Date  . Coronary artery disease     a. 1988 s/p CABG x3 with LIMA->LAD, VG->OM, VG->RCA;  b. 1999 s/p inf MI and PCI/stenting of VG->PDA;  c. 05/2013 Cath: LM nl, LAD 142m, D1 90, LCX 100p, RCA 100p, LIMA->LAD nl, VG->OM nl, VG->RPDA 40-50 isr, EF 55-65%->Med Rx.  Marland Kitchen Hypertension   . Hyperlipidemia   . Hypertriglyceridemia   . Gastroesophageal reflux disease with ulceration   . Peptic ulcer disease   . Postoperative atrial fibrillation (HCC)     history of   . Back pain   . Leg weakness   . Myocardial infarction ([place]) 1988  . COPD (chronic obstructive pulmonary disease) ([place])   . Arthritis   . Joint pain   . Hemorrhoids   .  Dementia   . Dysrhythmia   . Chronic kidney disease     kidney stone    Past Surgical History  Procedure Laterality Date  . Cardiac catheterization  06/2005     EF 50%. severe native CAD. SVG-RCA 50-60 othewise all grafts patent  . Left heart catheterization with coronary/graft angiogram N/A 06/06/2013    Procedure: LEFT HEART CATHETERIZATION WITH Beatrix Fetters;  Surgeon: Blane Ohara, MD;  Location: [facility] C[facility] LAB;  Service: Cardiovascular;  Laterality: N/A;  . Coronary artery bypass graft  1988    x3in 1988 with left nternal mammary artery to left anterior descending, vein graft to the obtuse marginal, vein graft to the right coronary artery  . Coronary angioplasty with stent placement  11/1997    status post  percutaneous coronary intervention and stenting with bare metal stent of the vein graft to the right coronary artery.  . Prostate surgery  march 2009    for stone and prostate trimmed  . Cervical spine surgery  Apr 09, 2009  . Hemorrhoid banding  2003  . Transurethral resection of prostate N/A 11/29/2014    Procedure: CYSTO TRANSURETHRAL RESECTION OF THE PROSTATE (TURP);  Surgeon: Festus Aloe, MD;  Location: [facility];  Service: Urology;  Laterality: N/A;  . Cystoscopy with litholapaxy N/A 11/29/2014    Procedure: CYSTOSCOPY WITH LITHOLAPAXY ;  Surgeon: Festus Aloe, MD;  Location: [facility];  Service: Urology;  Laterality: N/A;  . Cystoscopy with retrograde pyelogram, ureteroscopy and stent placement Right 11/29/2014    Procedure: RIGHT  RETROGRADE PYELOGRAM, URETEROSCOPY,  AND STENT PLACEMENT;  Surgeon: Festus Aloe, MD;  Location: [facility];  Service: Urology;  Laterality: Right;  . Holmium laser application Right [id number]    Procedure: HOLMIUM LASER APPLICATION;  Surgeon: Festus Aloe, MD;  Location: [facility];  Service: Urology;  Laterality: Right;  . Eye surgery      cataract surgery  . Cystoscopy with ureteroscopy, stone basketry and stent placement Right 12/24/2014    Procedure: CYSTOSCOPY WITH RIGHT URETEROSCOPY, WITH BASKETRY EXTRACTION AND STENT PLACEMENT/EXCHANGE;  Surgeon: Festus Aloe, MD;  Location: [facility];  Service: Urology;  Laterality: Right;  . Holmium laser application Right [id number]    Procedure: HOLMIUM LASER LITHROTRIPSY ;  Surgeon: Festus Aloe, MD;  Location: [facility];  Service: Urology;  Laterality: Right;    Social History   Social History  . Marital Status: Married    Spouse Name: N/A  . Number of Children: N/A  . Years of Education: N/A   Occupational History  . Not on file.   Social History Main Topics  . Smoking status: Former Smoker -- 0.25 packs/day for 10 years    Types: Cigarettes    Quit date: 03/23/1963  . Smokeless tobacco: Former Systems developer      Comment: Quit in 1965  . Alcohol Use: No  . Drug Use: No  . Sexual Activity: Not on file   Other Topics Concern  . Not on file   Social History Narrative    Family History  Problem Relation Age of Onset  . Stroke Father     ROS: Problems with mild dementia but no fevers or chills, productive cough, hemoptysis, dysphasia, odynophagia, melena, hematochezia, dysuria, hematuria, rash, seizure activity, orthopnea, PND, pedal edema, claudication. Remaining systems are negative.  Physical Exam: Well-developed frail in no acute distress.  Skin is warm and dry.  HEENT is normal.  Neck is supple.  Chest is clear to auscultation with normal expansion.  Cardiovascular exam  is irregular Abdominal exam nontender or distended. No masses palpated. Extremities show no edema. neuro grossly intact  ECG Sinus rhythm with first-degree AV block. PACs. RV conduction delay. Nonspecific ST changes.  A/P  1 Hyperlipidemia-continue statin. Followed by primary care.  2 hypertension-blood pressure controlled. Continue present medications.  3 coronary artery disease-continue aspirin and statin. No recent chest pain.  4 carotid artery disease-continue aspirin and statin.  Kirk Ruths, MD

## 2015-10-09 ENCOUNTER — Encounter: Payer: Self-pay | Admitting: Cardiology

## 2015-10-09 ENCOUNTER — Ambulatory Visit (INDEPENDENT_AMBULATORY_CARE_PROVIDER_SITE_OTHER): Payer: Medicare Other | Admitting: Cardiology

## 2015-10-09 VITALS — BP 110/60 | HR 78 | Ht 71.0 in | Wt 166.0 lb

## 2015-10-09 DIAGNOSIS — E785 Hyperlipidemia, unspecified: Secondary | ICD-10-CM | POA: Diagnosis not present

## 2015-10-09 DIAGNOSIS — I2510 Atherosclerotic heart disease of native coronary artery without angina pectoris: Secondary | ICD-10-CM

## 2015-10-09 DIAGNOSIS — I10 Essential (primary) hypertension: Secondary | ICD-10-CM | POA: Diagnosis not present

## 2015-10-09 NOTE — Patient Instructions (Addendum)
Medication Instructions:   Your physician recommends that you continue on your current medications as directed. Please refer to the Current Medication list given to you today.     If you need a refill on your cardiac medications before your next appointment, please call your pharmacy.  Labwork: NONE ORDER TODAY    Testing/Procedures: NONE ORDER TODAY    Follow-Up:  Your physician wants you to follow-up in: ONE YEAR WITH [facility]  You will receive a reminder letter in the mail two months in advance. If you don't receive a letter, please call our office to schedule the follow-up appointment.     Any Other Special Instructions Will Be Listed Below (If Applicable).                                                                                                                                                   

## 2015-11-17 ENCOUNTER — Other Ambulatory Visit: Payer: Self-pay

## 2016-01-15 DIAGNOSIS — Z23 Encounter for immunization: Secondary | ICD-10-CM | POA: Diagnosis not present

## 2016-01-15 DIAGNOSIS — R590 Localized enlarged lymph nodes: Secondary | ICD-10-CM | POA: Diagnosis not present

## 2016-03-01 DIAGNOSIS — N183 Chronic kidney disease, stage 3 (moderate): Secondary | ICD-10-CM | POA: Diagnosis not present

## 2016-03-01 DIAGNOSIS — J449 Chronic obstructive pulmonary disease, unspecified: Secondary | ICD-10-CM | POA: Diagnosis not present

## 2016-03-01 DIAGNOSIS — Z125 Encounter for screening for malignant neoplasm of prostate: Secondary | ICD-10-CM | POA: Diagnosis not present

## 2016-03-01 DIAGNOSIS — E782 Mixed hyperlipidemia: Secondary | ICD-10-CM | POA: Diagnosis not present

## 2016-03-01 DIAGNOSIS — E784 Other hyperlipidemia: Secondary | ICD-10-CM | POA: Diagnosis not present

## 2016-03-01 DIAGNOSIS — I10 Essential (primary) hypertension: Secondary | ICD-10-CM | POA: Diagnosis not present

## 2016-03-01 DIAGNOSIS — N401 Enlarged prostate with lower urinary tract symptoms: Secondary | ICD-10-CM | POA: Diagnosis not present

## 2016-03-01 DIAGNOSIS — F0391 Unspecified dementia with behavioral disturbance: Secondary | ICD-10-CM | POA: Diagnosis not present

## 2016-06-30 DIAGNOSIS — N183 Chronic kidney disease, stage 3 (moderate): Secondary | ICD-10-CM | POA: Diagnosis not present

## 2016-06-30 DIAGNOSIS — I10 Essential (primary) hypertension: Secondary | ICD-10-CM | POA: Diagnosis not present

## 2016-06-30 DIAGNOSIS — F0391 Unspecified dementia with behavioral disturbance: Secondary | ICD-10-CM | POA: Diagnosis not present

## 2016-06-30 DIAGNOSIS — J449 Chronic obstructive pulmonary disease, unspecified: Secondary | ICD-10-CM | POA: Diagnosis not present

## 2016-06-30 DIAGNOSIS — E784 Other hyperlipidemia: Secondary | ICD-10-CM | POA: Diagnosis not present

## 2016-06-30 DIAGNOSIS — E782 Mixed hyperlipidemia: Secondary | ICD-10-CM | POA: Diagnosis not present

## 2016-06-30 DIAGNOSIS — I2510 Atherosclerotic heart disease of native coronary artery without angina pectoris: Secondary | ICD-10-CM | POA: Diagnosis not present

## 2016-06-30 DIAGNOSIS — Z23 Encounter for immunization: Secondary | ICD-10-CM | POA: Diagnosis not present

## 2016-08-30 DIAGNOSIS — N323 Diverticulum of bladder: Secondary | ICD-10-CM | POA: Diagnosis not present

## 2016-11-03 ENCOUNTER — Encounter: Payer: Self-pay | Admitting: Cardiology

## 2016-11-16 NOTE — Progress Notes (Signed)
HPI: FU coronary artery disease. He is status post bypass grafting in 1988 and 1999. He had an inferior infarct and underwent stenting of the vein graft to the right coronary artery. Abdominal CT in 2000 showed no aneurysm. Carotid Dopplers in Dec 2013 showed 0-39% stenosis bilaterally. ABIs in November of 2011 were normal. Cardiac catheterization in March of 2015 showed severe three-vessel coronary disease with occlusion of all native vessels. The LIMA to the LAD, saphenous vein graft to the acute marginal and saphenous vein graft to the PDA were patent. LV function normal. There was note of a tight lesion in a small diagonal branch and PCI could be considered for refractory symptoms. Echocardiogram in March 2015 showed normal LV function, grade 1 diastolic dysfunction and mild left atrial enlargement. Since last seen, patient does have some dyspnea on exertion but no orthopnea, PND, pedal edema, chest pain or syncope. He is having difficulties with dementia.   Current Outpatient Prescriptions  Medication Sig Dispense Refill  . acetaminophen (TYLENOL) 500 MG tablet Take 1,000 mg by mouth every 6 (six) hours as needed for mild pain, moderate pain, fever or headache.    . albuterol (PROVENTIL) (2.5 MG/3ML) 0.083% nebulizer solution Take 2.5 mg by nebulization every 6 (six) hours as needed for wheezing or shortness of breath.     . Artificial Tear Ointment (DRY EYES OP) Place 2 drops into both eyes daily as needed (for dry eyes).    Marland Kitchen aspirin EC 81 MG tablet Take 81 mg by mouth at bedtime.    . beclomethasone (QVAR) 80 MCG/ACT inhaler Inhale 2 puffs into the lungs daily as needed (for shortness of breath).     . ezetimibe-simvastatin (VYTORIN) 10-40 MG per tablet Take 1 tablet by mouth at bedtime.     . fenofibrate 160 MG tablet Take 160 mg by mouth at bedtime.     . finasteride (PROSCAR) 5 MG tablet Take 5 mg by mouth every evening.    . fish oil-omega-3 fatty acids 1000 MG capsule Take 4 g by  mouth 2 (two) times daily.     Marland Kitchen ipratropium (ATROVENT) 0.02 % nebulizer solution Take 500 mcg by nebulization every 6 (six) hours as needed for shortness of breath.     . niacin 500 MG tablet Take 1,000 mg by mouth at bedtime.     . ramipril (ALTACE) 10 MG capsule Take 10 mg by mouth daily with breakfast.     . ranitidine (ZANTAC) 150 MG tablet Take 300 mg by mouth 2 (two) times daily.     . tamsulosin (FLOMAX) 0.4 MG CAPS capsule Take 0.4 mg by mouth daily after supper.      No current facility-administered medications for this visit.      Past Medical History:  Diagnosis Date  . Arthritis   . Back pain   . Chronic kidney disease    kidney stone  . COPD (chronic obstructive pulmonary disease) ([place])   . Coronary artery disease    a. 1988 s/p CABG x3 with LIMA->LAD, VG->OM, VG->RCA;  b. 1999 s/p inf MI and PCI/stenting of VG->PDA;  c. 05/2013 Cath: LM nl, LAD 145m, D1 90, LCX 100p, RCA 100p, LIMA->LAD nl, VG->OM nl, VG->RPDA 40-50 isr, EF 55-65%->Med Rx.  . Dementia   . Dysrhythmia   . Gastroesophageal reflux disease with ulceration   . Hemorrhoids   . Hyperlipidemia   . Hypertension   . Hypertriglyceridemia   . Joint pain   . Leg  weakness   . Myocardial infarction ([place]) 1988  . Peptic ulcer disease   . Postoperative atrial fibrillation ([facility])    history of     Past Surgical History:  Procedure Laterality Date  . CARDIAC CATHETERIZATION  06/2005    EF 50%. severe native CAD. SVG-RCA 50-60 othewise all grafts patent  . CERVICAL SPINE SURGERY  Apr 09, 2009  . CORONARY ANGIOPLASTY WITH STENT PLACEMENT  11/1997   status post percutaneous coronary intervention and stenting with bare metal stent of the vein graft to the right coronary artery.  . CORONARY ARTERY BYPASS GRAFT  1988   [place] with left nternal mammary artery to left anterior descending, vein graft to the obtuse marginal, vein graft to the right coronary artery  . CYSTOSCOPY WITH LITHOLAPAXY N/A 11/29/2014   Procedure:  CYSTOSCOPY WITH LITHOLAPAXY ;  Surgeon: Festus Aloe, MD;  Location: [facility];  Service: Urology;  Laterality: N/A;  . CYSTOSCOPY WITH RETROGRADE PYELOGRAM, URETEROSCOPY AND STENT PLACEMENT Right 11/29/2014   Procedure: RIGHT  RETROGRADE PYELOGRAM, URETEROSCOPY,  AND STENT PLACEMENT;  Surgeon: Festus Aloe, MD;  Location: [facility];  Service: Urology;  Laterality: Right;  . CYSTOSCOPY WITH URETEROSCOPY, STONE BASKETRY AND STENT PLACEMENT Right 12/24/2014   Procedure: CYSTOSCOPY WITH RIGHT URETEROSCOPY, WITH BASKETRY EXTRACTION AND STENT PLACEMENT/EXCHANGE;  Surgeon: Festus Aloe, MD;  Location: [facility];  Service: Urology;  Laterality: Right;  . EYE SURGERY     cataract surgery  . HEMORRHOID BANDING  2003  . HOLMIUM LASER APPLICATION Right 10/27/5025   Procedure: HOLMIUM LASER APPLICATION;  Surgeon: Festus Aloe, MD;  Location: [facility];  Service: Urology;  Laterality: Right;  . HOLMIUM LASER APPLICATION Right 74/03/2876   Procedure: HOLMIUM LASER LITHROTRIPSY ;  Surgeon: Festus Aloe, MD;  Location: [facility];  Service: Urology;  Laterality: Right;  . LEFT HEART CATHETERIZATION WITH CORONARY/GRAFT ANGIOGRAM N/A 06/06/2013   Procedure: LEFT HEART CATHETERIZATION WITH Beatrix Fetters;  Surgeon: Blane Ohara, MD;  Location: [facility] CATH LAB;  Service: Cardiovascular;  Laterality: N/A;  . PROSTATE SURGERY  march 2009   for stone and prostate trimmed  . TRANSURETHRAL RESECTION OF PROSTATE N/A 11/29/2014   Procedure: CYSTO TRANSURETHRAL RESECTION OF THE PROSTATE (TURP);  Surgeon: Festus Aloe, MD;  Location: [facility];  Service: Urology;  Laterality: N/A;    Social History   Social History  . Marital status: Married    Spouse name: N/A  . Number of children: N/A  . Years of education: N/A   Occupational History  . Not on file.   Social History Main Topics  . Smoking status: Former Smoker    Packs/day: 0.25    Years: 10.00    Types: Cigarettes    Quit date: 03/23/1963  .  Smokeless tobacco: Former Systems developer     Comment: Quit in 1965  . Alcohol use No  . Drug use: No  . Sexual activity: Not on file   Other Topics Concern  . Not on file   Social History Narrative  . No narrative on file    Family History  Problem Relation Age of Onset  . Stroke Father     ROS: no fevers or chills, productive cough, hemoptysis, dysphasia, odynophagia, melena, hematochezia, dysuria, hematuria, rash, seizure activity, orthopnea, PND, pedal edema, claudication. Remaining systems are negative.  Physical Exam: Well-developed elderly in no acute distress.  Skin is warm and dry.  HEENT is normal.  Neck is supple.  Chest is clear to auscultation with normal expansion.  Cardiovascular exam is regular rate and rhythm.  Abdominal exam nontender or distended. No masses palpated. Extremities show no edema. neuro grossly intact; appears to have significant dementia.   ECG- sinus rhythm at a rate of 81. First-degree AV block. PACs. RV conduction delay. personally reviewed  A/P  1 coronary artery disease-continue aspirin and statin. Patient is doing well from a symptomatic standpoint.  2 hypertension-blood pressure is controlled. Continue present medications.  3 hyperlipidemia-continue statin. Lipids and liver are monitored by primary care.  4 carotid artery disease-continue aspirin and statin.  Kirk Ruths, MD

## 2016-11-26 ENCOUNTER — Encounter: Payer: Self-pay | Admitting: Cardiology

## 2016-11-26 ENCOUNTER — Ambulatory Visit (INDEPENDENT_AMBULATORY_CARE_PROVIDER_SITE_OTHER): Payer: Medicare Other | Admitting: Cardiology

## 2016-11-26 VITALS — BP 116/70 | HR 81 | Ht 71.0 in | Wt 163.0 lb

## 2016-11-26 DIAGNOSIS — I10 Essential (primary) hypertension: Secondary | ICD-10-CM

## 2016-11-26 DIAGNOSIS — I25810 Atherosclerosis of coronary artery bypass graft(s) without angina pectoris: Secondary | ICD-10-CM | POA: Diagnosis not present

## 2016-11-26 DIAGNOSIS — E7800 Pure hypercholesterolemia, unspecified: Secondary | ICD-10-CM

## 2016-11-26 NOTE — Patient Instructions (Signed)
Your physician wants you to follow-up in: ONE YEAR WITH DR CRENSHAW You will receive a reminder letter in the mail two months in advance. If you don't receive a letter, please call our office to schedule the follow-up appointment.   If you need a refill on your cardiac medications before your next appointment, please call your pharmacy.  

## 2016-12-14 DIAGNOSIS — Z23 Encounter for immunization: Secondary | ICD-10-CM | POA: Diagnosis not present

## 2016-12-14 DIAGNOSIS — N183 Chronic kidney disease, stage 3 (moderate): Secondary | ICD-10-CM | POA: Diagnosis not present

## 2016-12-14 DIAGNOSIS — J449 Chronic obstructive pulmonary disease, unspecified: Secondary | ICD-10-CM | POA: Diagnosis not present

## 2016-12-14 DIAGNOSIS — I2510 Atherosclerotic heart disease of native coronary artery without angina pectoris: Secondary | ICD-10-CM | POA: Diagnosis not present

## 2016-12-14 DIAGNOSIS — I10 Essential (primary) hypertension: Secondary | ICD-10-CM | POA: Diagnosis not present

## 2016-12-14 DIAGNOSIS — F0391 Unspecified dementia with behavioral disturbance: Secondary | ICD-10-CM | POA: Diagnosis not present

## 2016-12-14 DIAGNOSIS — E784 Other hyperlipidemia: Secondary | ICD-10-CM | POA: Diagnosis not present

## 2017-04-15 DIAGNOSIS — E7849 Other hyperlipidemia: Secondary | ICD-10-CM | POA: Diagnosis not present

## 2017-04-15 DIAGNOSIS — I2510 Atherosclerotic heart disease of native coronary artery without angina pectoris: Secondary | ICD-10-CM | POA: Diagnosis not present

## 2017-04-15 DIAGNOSIS — I10 Essential (primary) hypertension: Secondary | ICD-10-CM | POA: Diagnosis not present

## 2017-04-15 DIAGNOSIS — N183 Chronic kidney disease, stage 3 (moderate): Secondary | ICD-10-CM | POA: Diagnosis not present

## 2017-04-15 DIAGNOSIS — F0391 Unspecified dementia with behavioral disturbance: Secondary | ICD-10-CM | POA: Diagnosis not present

## 2017-04-15 DIAGNOSIS — J449 Chronic obstructive pulmonary disease, unspecified: Secondary | ICD-10-CM | POA: Diagnosis not present

## 2017-08-12 DIAGNOSIS — I10 Essential (primary) hypertension: Secondary | ICD-10-CM | POA: Diagnosis not present

## 2017-08-12 DIAGNOSIS — E7849 Other hyperlipidemia: Secondary | ICD-10-CM | POA: Diagnosis not present

## 2017-08-12 DIAGNOSIS — I2510 Atherosclerotic heart disease of native coronary artery without angina pectoris: Secondary | ICD-10-CM | POA: Diagnosis not present

## 2017-08-12 DIAGNOSIS — E782 Mixed hyperlipidemia: Secondary | ICD-10-CM | POA: Diagnosis not present

## 2017-08-12 DIAGNOSIS — J449 Chronic obstructive pulmonary disease, unspecified: Secondary | ICD-10-CM | POA: Diagnosis not present

## 2017-08-12 DIAGNOSIS — Z6826 Body mass index (BMI) 26.0-26.9, adult: Secondary | ICD-10-CM | POA: Diagnosis not present

## 2017-08-12 DIAGNOSIS — N183 Chronic kidney disease, stage 3 (moderate): Secondary | ICD-10-CM | POA: Diagnosis not present

## 2017-08-12 DIAGNOSIS — F0391 Unspecified dementia with behavioral disturbance: Secondary | ICD-10-CM | POA: Diagnosis not present

## 2017-09-02 DIAGNOSIS — R351 Nocturia: Secondary | ICD-10-CM | POA: Diagnosis not present

## 2017-09-02 DIAGNOSIS — N323 Diverticulum of bladder: Secondary | ICD-10-CM | POA: Diagnosis not present

## 2017-09-02 DIAGNOSIS — N401 Enlarged prostate with lower urinary tract symptoms: Secondary | ICD-10-CM | POA: Diagnosis not present

## 2017-11-28 NOTE — Progress Notes (Signed)
HPI: FU coronary artery disease. He is status post bypass grafting in 1988 and 1999. He had an inferior infarct and underwent stenting of the vein graft to the right coronary artery. Abdominal CT in 2000 showed no aneurysm. Carotid Dopplers in Dec 2013 showed 0-39% stenosis bilaterally. ABIs in November of 2011 were normal. Cardiac catheterization in March of 2015 showed severe three-vessel coronary disease with occlusion of all native vessels. The LIMA to the LAD, saphenous vein graft to the acute marginal and saphenous vein graft to the PDA were patent. LV function normal. There was note of a tight lesion in a small diagonal branch and PCI could be considered for refractory symptoms. Echocardiogram in March 2015 showed normal LV function, grade 1 diastolic dysfunction and mild left atrial enlargement. Since last seen,  there is no dyspnea, chest pain, palpitations or syncope.  Current Outpatient Medications  Medication Sig Dispense Refill  . acetaminophen (TYLENOL) 500 MG tablet Take 1,000 mg by mouth every 6 (six) hours as needed for mild pain, moderate pain, fever or headache.    . albuterol (PROVENTIL) (2.5 MG/3ML) 0.083% nebulizer solution Take 2.5 mg by nebulization every 6 (six) hours as needed for wheezing or shortness of breath.     . Artificial Tear Ointment (DRY EYES OP) Place 2 drops into both eyes daily as needed (for dry eyes).    Marland Kitchen aspirin EC 81 MG tablet Take 81 mg by mouth at bedtime.    . beclomethasone (QVAR) 80 MCG/ACT inhaler Inhale 2 puffs into the lungs daily as needed (for shortness of breath).     . ezetimibe-simvastatin (VYTORIN) 10-40 MG per tablet Take 1 tablet by mouth every other day.     . fenofibrate (TRICOR) 145 MG tablet Take 145 mg by mouth daily.    . finasteride (PROSCAR) 5 MG tablet Take 5 mg by mouth every evening.    . fish oil-omega-3 fatty acids 1000 MG capsule Take 4 g by mouth 2 (two) times daily.     Marland Kitchen ipratropium (ATROVENT) 0.02 % nebulizer  solution Take 500 mcg by nebulization every 6 (six) hours as needed for shortness of breath.     . ramipril (ALTACE) 10 MG capsule Take 10 mg by mouth daily with breakfast.     . ranitidine (ZANTAC) 150 MG tablet Take 300 mg by mouth 2 (two) times daily.      No current facility-administered medications for this visit.      Past Medical History:  Diagnosis Date  . Arthritis   . Back pain   . Chronic kidney disease    kidney stone  . COPD (chronic obstructive pulmonary disease) ([place])   . Coronary artery disease    a. 1988 s/p CABG x3 with LIMA->LAD, VG->OM, VG->RCA;  b. 1999 s/p inf MI and PCI/stenting of VG->PDA;  c. 05/2013 Cath: LM nl, LAD 161m, D1 90, LCX 100p, RCA 100p, LIMA->LAD nl, VG->OM nl, VG->RPDA 40-50 isr, EF 55-65%->Med Rx.  . Dementia   . Dysrhythmia   . Gastroesophageal reflux disease with ulceration   . Hemorrhoids   . Hyperlipidemia   . Hypertension   . Hypertriglyceridemia   . Joint pain   . Leg weakness   . Myocardial infarction ([place]) 1988  . Peptic ulcer disease   . Postoperative atrial fibrillation ([facility])    history of     Past Surgical History:  Procedure Laterality Date  . CARDIAC CATHETERIZATION  06/2005    EF 50%. severe native  CAD. SVG-RCA 50-60 othewise all grafts patent  . CERVICAL SPINE SURGERY  Apr 09, 2009  . CORONARY ANGIOPLASTY WITH STENT PLACEMENT  11/1997   status post percutaneous coronary intervention and stenting with bare metal stent of the vein graft to the right coronary artery.  . CORONARY ARTERY BYPASS GRAFT  1988   [place] with left nternal mammary artery to left anterior descending, vein graft to the obtuse marginal, vein graft to the right coronary artery  . CYSTOSCOPY WITH LITHOLAPAXY N/A 11/29/2014   Procedure: CYSTOSCOPY WITH LITHOLAPAXY ;  Surgeon: Festus Aloe, MD;  [facility]: [facility];  Service: Urology;  Laterality: N/A;  . CYSTOSCOPY WITH RETROGRADE PYELOGRAM, URETEROSCOPY AND STENT PLACEMENT Right 11/29/2014   Procedure:  RIGHT  RETROGRADE PYELOGRAM, URETEROSCOPY,  AND STENT PLACEMENT;  Surgeon: Festus Aloe, MD;  Location: [facility];  Service: Urology;  Laterality: Right;  . CYSTOSCOPY WITH URETEROSCOPY, STONE BASKETRY AND STENT PLACEMENT Right 12/24/2014   Procedure: CYSTOSCOPY WITH RIGHT URETEROSCOPY, WITH BASKETRY EXTRACTION AND STENT PLACEMENT/EXCHANGE;  Surgeon: Festus Aloe, MD;  Location: [facility];  Service: Urology;  Laterality: Right;  . EYE SURGERY     cataract surgery  . HEMORRHOID BANDING  2003  . HOLMIUM LASER APPLICATION Right 10/24/1322   Procedure: HOLMIUM LASER APPLICATION;  Surgeon: Festus Aloe, MD;  Location: [facility];  Service: Urology;  Laterality: Right;  . HOLMIUM LASER APPLICATION Right 40/03/270   Procedure: HOLMIUM LASER LITHROTRIPSY ;  Surgeon: Festus Aloe, MD;  Location: [facility];  Service: Urology;  Laterality: Right;  . LEFT HEART CATHETERIZATION WITH CORONARY/GRAFT ANGIOGRAM N/A 06/06/2013   Procedure: LEFT HEART CATHETERIZATION WITH Beatrix Fetters;  Surgeon: Blane Ohara, MD;  Location: [facility] CATH LAB;  Service: Cardiovascular;  Laterality: N/A;  . PROSTATE SURGERY  march 2009   for stone and prostate trimmed  . TRANSURETHRAL RESECTION OF PROSTATE N/A 11/29/2014   Procedure: CYSTO TRANSURETHRAL RESECTION OF THE PROSTATE (TURP);  Surgeon: Festus Aloe, MD;  Location: [facility];  Service: Urology;  Laterality: N/A;    Social History   Socioeconomic History  . Marital status: Married    Spouse name: Not on file  . Number of children: Not on file  . Years of education: Not on file  . Highest education level: Not on file  Occupational History  . Not on file  Social Needs  . Financial resource strain: Not on file  . Food insecurity:    Worry: Not on file    Inability: Not on file  . Transportation needs:    Medical: Not on file    Non-medical: Not on file  Tobacco Use  . Smoking status: Former Smoker    Packs/day: 0.25    Years: 10.00    Pack years:  2.50    Types: Cigarettes    Last attempt to quit: 03/23/1963    Years since quitting: 54.7  . Smokeless tobacco: Former Systems developer  . Tobacco comment: Quit in 1965  Substance and Sexual Activity  . Alcohol use: No  . Drug use: No  . Sexual activity: Not on file  Lifestyle  . Physical activity:    Days per week: Not on file    Minutes per session: Not on file  . Stress: Not on file  Relationships  . Social connections:    Talks on phone: Not on file    Gets together: Not on file    Attends religious service: Not on file    Active member of club or organization: Not  on file    Attends meetings of clubs or organizations: Not on file    Relationship status: Not on file  . Intimate partner violence:    Fear of current or ex partner: Not on file    Emotionally abused: Not on file    Physically abused: Not on file    Forced sexual activity: Not on file  Other Topics Concern  . Not on file  Social History Narrative  . Not on file    Family History  Problem Relation Age of Onset  . Stroke Father     ROS: no fevers or chills, productive cough, hemoptysis, dysphasia, odynophagia, melena, hematochezia, dysuria, hematuria, rash, seizure activity, orthopnea, PND, pedal edema, claudication. Remaining systems are negative.  Physical Exam: Well-developed chronically ill appearing in no acute distress.  Skin is warm and dry.  HEENT is normal.  Neck is supple.  Chest with diminished BS throughout Cardiovascular exam is regular rate and rhythm.  Abdominal exam nontender or distended. No masses palpated. Extremities show no edema. neuro grossly intact  ECG-normal sinus rhythm at a rate of 83.  Normal axis.  Nonspecific ST changes.  Personally reviewed  A/P  1 coronary artery disease-patient is doing well with no chest pain.  Continue medical therapy with aspirin; statin DC'd by primary care because of myalgias.  2 hyperlipidemia-management per primary care.  3 hypertension-patient's  blood pressure is controlled.  Continue present medications.  4 carotid artery disease-continue aspirin.  Kirk Ruths, MD

## 2017-11-29 ENCOUNTER — Encounter: Payer: Self-pay | Admitting: Cardiology

## 2017-11-29 ENCOUNTER — Ambulatory Visit (INDEPENDENT_AMBULATORY_CARE_PROVIDER_SITE_OTHER): Payer: Medicare Other | Admitting: Cardiology

## 2017-11-29 VITALS — BP 98/58 | HR 83 | Ht 70.0 in | Wt 167.0 lb

## 2017-11-29 DIAGNOSIS — I2510 Atherosclerotic heart disease of native coronary artery without angina pectoris: Secondary | ICD-10-CM

## 2017-11-29 DIAGNOSIS — E7800 Pure hypercholesterolemia, unspecified: Secondary | ICD-10-CM

## 2017-11-29 DIAGNOSIS — I10 Essential (primary) hypertension: Secondary | ICD-10-CM | POA: Diagnosis not present

## 2017-11-29 NOTE — Patient Instructions (Signed)
Medication Instructions: Your physician recommends that you continue on your current medications as directed.    If you need a refill on your cardiac medications before your next appointment, please call your pharmacy.   Labwork: none  Procedures/Testing: none  Follow-Up: Your physician wants you to follow-up in 1 year with Dr. Stanford Breed.  You will receive a reminder letter in the mail two months in advance. If you don't receive a letter, please call our office at [phone] to schedule this follow-up appointment.   Special Instructions:    Thank you for choosing [facility] at [facility]!!

## 2017-12-16 DIAGNOSIS — Z23 Encounter for immunization: Secondary | ICD-10-CM | POA: Diagnosis not present

## 2017-12-16 DIAGNOSIS — I2510 Atherosclerotic heart disease of native coronary artery without angina pectoris: Secondary | ICD-10-CM | POA: Diagnosis not present

## 2017-12-16 DIAGNOSIS — F0391 Unspecified dementia with behavioral disturbance: Secondary | ICD-10-CM | POA: Diagnosis not present

## 2017-12-16 DIAGNOSIS — N183 Chronic kidney disease, stage 3 (moderate): Secondary | ICD-10-CM | POA: Diagnosis not present

## 2017-12-16 DIAGNOSIS — I10 Essential (primary) hypertension: Secondary | ICD-10-CM | POA: Diagnosis not present

## 2017-12-16 DIAGNOSIS — L570 Actinic keratosis: Secondary | ICD-10-CM | POA: Diagnosis not present

## 2017-12-16 DIAGNOSIS — E782 Mixed hyperlipidemia: Secondary | ICD-10-CM | POA: Diagnosis not present

## 2017-12-16 DIAGNOSIS — J449 Chronic obstructive pulmonary disease, unspecified: Secondary | ICD-10-CM | POA: Diagnosis not present

## 2018-01-11 DIAGNOSIS — B079 Viral wart, unspecified: Secondary | ICD-10-CM | POA: Diagnosis not present

## 2018-01-11 DIAGNOSIS — C44329 Squamous cell carcinoma of skin of other parts of face: Secondary | ICD-10-CM | POA: Diagnosis not present

## 2018-01-11 DIAGNOSIS — D2239 Melanocytic nevi of other parts of face: Secondary | ICD-10-CM | POA: Diagnosis not present

## 2018-01-24 DIAGNOSIS — C44329 Squamous cell carcinoma of skin of other parts of face: Secondary | ICD-10-CM | POA: Diagnosis not present

## 2018-05-08 DIAGNOSIS — I2510 Atherosclerotic heart disease of native coronary artery without angina pectoris: Secondary | ICD-10-CM | POA: Diagnosis not present

## 2018-05-08 DIAGNOSIS — F0391 Unspecified dementia with behavioral disturbance: Secondary | ICD-10-CM | POA: Diagnosis not present

## 2018-05-08 DIAGNOSIS — E782 Mixed hyperlipidemia: Secondary | ICD-10-CM | POA: Diagnosis not present

## 2018-05-08 DIAGNOSIS — Z6824 Body mass index (BMI) 24.0-24.9, adult: Secondary | ICD-10-CM | POA: Diagnosis not present

## 2018-05-08 DIAGNOSIS — I10 Essential (primary) hypertension: Secondary | ICD-10-CM | POA: Diagnosis not present

## 2018-05-08 DIAGNOSIS — N183 Chronic kidney disease, stage 3 (moderate): Secondary | ICD-10-CM | POA: Diagnosis not present

## 2018-09-06 DIAGNOSIS — E782 Mixed hyperlipidemia: Secondary | ICD-10-CM | POA: Diagnosis not present

## 2018-09-06 DIAGNOSIS — N183 Chronic kidney disease, stage 3 (moderate): Secondary | ICD-10-CM | POA: Diagnosis not present

## 2018-09-06 DIAGNOSIS — F0391 Unspecified dementia with behavioral disturbance: Secondary | ICD-10-CM | POA: Diagnosis not present

## 2018-09-06 DIAGNOSIS — I10 Essential (primary) hypertension: Secondary | ICD-10-CM | POA: Diagnosis not present

## 2018-09-06 DIAGNOSIS — Z6825 Body mass index (BMI) 25.0-25.9, adult: Secondary | ICD-10-CM | POA: Diagnosis not present

## 2018-09-06 DIAGNOSIS — J449 Chronic obstructive pulmonary disease, unspecified: Secondary | ICD-10-CM | POA: Diagnosis not present

## 2018-09-06 DIAGNOSIS — Z1159 Encounter for screening for other viral diseases: Secondary | ICD-10-CM | POA: Diagnosis not present

## 2018-09-06 DIAGNOSIS — H906 Mixed conductive and sensorineural hearing loss, bilateral: Secondary | ICD-10-CM | POA: Diagnosis not present

## 2018-09-06 DIAGNOSIS — I2510 Atherosclerotic heart disease of native coronary artery without angina pectoris: Secondary | ICD-10-CM | POA: Diagnosis not present

## 2018-10-02 ENCOUNTER — Telehealth: Payer: Self-pay | Admitting: *Deleted

## 2018-10-02 NOTE — Telephone Encounter (Signed)
Unable to leave a message, no voicemail.  

## 2018-11-01 ENCOUNTER — Telehealth (INDEPENDENT_AMBULATORY_CARE_PROVIDER_SITE_OTHER): Payer: Medicare Other | Admitting: Cardiology

## 2018-11-01 VITALS — BP 120/62 | HR 69 | Temp 97.5°F | Ht 70.0 in | Wt 166.0 lb

## 2018-11-01 DIAGNOSIS — I10 Essential (primary) hypertension: Secondary | ICD-10-CM | POA: Diagnosis not present

## 2018-11-01 DIAGNOSIS — E7800 Pure hypercholesterolemia, unspecified: Secondary | ICD-10-CM

## 2018-11-01 DIAGNOSIS — I25810 Atherosclerosis of coronary artery bypass graft(s) without angina pectoris: Secondary | ICD-10-CM | POA: Diagnosis not present

## 2018-11-01 NOTE — Progress Notes (Signed)
Virtual Visit via Video Note changed to phone visit at patient request.   This visit type was conducted due to national recommendations for restrictions regarding the COVID-19 Pandemic (e.g. social distancing) in an effort to limit this patient's exposure and mitigate transmission in our community.  Due to his co-morbid illnesses, this patient is at least at moderate risk for complications without adequate follow up.  This format is felt to be most appropriate for this patient at this time.  All issues noted in this document were discussed and addressed.  A limited physical exam was performed with this format.  Please refer to the patient's chart for his consent to telehealth for [facility].   Date:  11/01/2018   ID:  [name], DOB February 23, 1929, MRN [id number]  Patient Location:Home Provider Location: Home  PCP:  [name], MD  Cardiologist:  Dr Stanford Breed  Evaluation Performed:  Follow-Up Visit  Chief Complaint:  FU CAD  History of Present Illness:    FU coronary artery disease. He is status post bypass grafting in 1988 and 1999. He had an inferior infarct and underwent stenting of the vein graft to the right coronary artery. Abdominal CT in 2000 showed no aneurysm. Carotid Dopplers in Dec 2013 showed 0-39% stenosis bilaterally. ABIs in November of 2011 were normal. Cardiac catheterization in March of 2015 showed severe three-vessel coronary disease with occlusion of all native vessels. The LIMA to the LAD, saphenous vein graft to the acute marginal and saphenous vein graft to the PDA were patent. LV function normal. There was note of a tight lesion in a small diagonal branch and PCI could be considered for refractory symptoms. Echocardiogram in March 2015 showed normal LV function, grade 1 diastolic dysfunction and mild left atrial enlargement. Since last seen,there is no chest pain, dyspnea or syncope.  The patient does not have symptoms concerning for COVID-19  infection (fever, chills, cough, or new shortness of breath).    Past Medical History:  Diagnosis Date  . Arthritis   . Back pain   . Chronic kidney disease    kidney stone  . COPD (chronic obstructive pulmonary disease) ([place])   . Coronary artery disease    a. 1988 s/p CABG x3 with LIMA->LAD, VG->OM, VG->RCA;  b. 1999 s/p inf MI and PCI/stenting of VG->PDA;  c. 05/2013 Cath: LM nl, LAD 123m, D1 90, LCX 100p, RCA 100p, LIMA->LAD nl, VG->OM nl, VG->RPDA 40-50 isr, EF 55-65%->Med Rx.  . Dementia   . Dysrhythmia   . Gastroesophageal reflux disease with ulceration   . Hemorrhoids   . Hyperlipidemia   . Hypertension   . Hypertriglyceridemia   . Joint pain   . Leg weakness   . Myocardial infarction ([place]) 1988  . Peptic ulcer disease   . Postoperative atrial fibrillation (HCC)    history of    Past Surgical History:  Procedure Laterality Date  . CARDIAC CATHETERIZATION  06/2005    EF 50%. severe native CAD. SVG-RCA 50-60 othewise all grafts patent  . CERVICAL SPINE SURGERY  Apr 09, 2009  . CORONARY ANGIOPLASTY WITH STENT PLACEMENT  11/1997   status post percutaneous coronary intervention and stenting with bare metal stent of the vein graft to the right coronary artery.  . CORONARY ARTERY BYPASS GRAFT  1988   [place] with left nternal mammary artery to left anterior descending, vein graft to the obtuse marginal, vein graft to the right coronary artery  . CYSTOSCOPY WITH LITHOLAPAXY N/A 11/29/2014  Procedure: CYSTOSCOPY WITH LITHOLAPAXY ;  Surgeon: Festus Aloe, MD;  [facility];  Service: Urology;  Laterality: N/A;  . CYSTOSCOPY WITH RETROGRADE PYELOGRAM, URETEROSCOPY AND STENT PLACEMENT Right 11/29/2014   Procedure: RIGHT  RETROGRADE PYELOGRAM, URETEROSCOPY,  AND STENT PLACEMENT;  Surgeon: Festus Aloe, MD;  [facility];  Service: Urology;  Laterality: Right;  . CYSTOSCOPY WITH URETEROSCOPY, STONE BASKETRY AND STENT PLACEMENT Right 12/24/2014   Procedure: CYSTOSCOPY WITH  RIGHT URETEROSCOPY, WITH BASKETRY EXTRACTION AND STENT PLACEMENT/EXCHANGE;  Surgeon: Festus Aloe, MD;  [facility];  Service: Urology;  Laterality: Right;  . EYE SURGERY     cataract surgery  . HEMORRHOID BANDING  2003  . HOLMIUM LASER APPLICATION Right 06/28/7024   Procedure: HOLMIUM LASER APPLICATION;  Surgeon: Festus Aloe, MD;  [facility];  Service: Urology;  Laterality: Right;  . HOLMIUM LASER APPLICATION Right 37/10/5883   Procedure: HOLMIUM LASER LITHROTRIPSY ;  Surgeon: Festus Aloe, MD;  [facility];  Service: Urology;  Laterality: Right;  . LEFT HEART CATHETERIZATION WITH CORONARY/GRAFT ANGIOGRAM N/A 06/06/2013   Procedure: LEFT HEART CATHETERIZATION WITH Beatrix Fetters;  Surgeon: Blane Ohara, MD;  Location[facility] C[facility] LAB;  Service: Cardiovascular;  Laterality: N/A;  . PROSTATE SURGERY  march 2009   for stone and prostate trimmed  . TRANSURETHRAL RESECTION OF PROSTATE N/A 11/29/2014   Procedure: CYSTO TRANSURETHRAL RESECTION OF THE PROSTATE (TURP);  Surgeon: Festus Aloe, MD;  [facility];  Service: Urology;  Laterality: N/A;     Current Meds  Medication Sig  . acetaminophen (TYLENOL) 500 MG tablet Take 1,000 mg by mouth every 6 (six) hours as needed for mild pain, moderate pain, fever or headache.  . albuterol (PROVENTIL) (2.5 MG/3ML) 0.083% nebulizer solution Take 2.5 mg by nebulization every 6 (six) hours as needed for wheezing or shortness of breath.   . Artificial Tear Ointment (DRY EYES OP) Place 2 drops into both eyes daily as needed (for dry eyes).  Marland Kitchen aspirin EC 81 MG tablet Take 81 mg by mouth at bedtime.  . famotidine (PEPCID) 20 MG tablet Take 20 mg by mouth 2 (two) times daily.  . fenofibrate (TRICOR) 145 MG tablet Take 145 mg by mouth daily.  . finasteride (PROSCAR) 5 MG tablet Take 5 mg by mouth every evening.  . fish oil-omega-3 fatty acids 1000 MG capsule Take 4 g by mouth 2 (two) times daily.   Marland Kitchen ipratropium (ATROVENT)  0.02 % nebulizer solution Take 500 mcg by nebulization every 6 (six) hours as needed for shortness of breath.   . ramipril (ALTACE) 10 MG capsule Take 10 mg by mouth daily with breakfast.   . [DISCONTINUED] beclomethasone (QVAR) 80 MCG/ACT inhaler Inhale 2 puffs into the lungs daily as needed (for shortness of breath).   . [DISCONTINUED] ezetimibe-simvastatin (VYTORIN) 10-40 MG per tablet Take 1 tablet by mouth every other day.   . [DISCONTINUED] ranitidine (ZANTAC) 150 MG tablet Take 300 mg by mouth 2 (two) times daily.      Allergies:   Penicillins   Social History   Tobacco Use  . Smoking status: Former Smoker    Packs/day: 0.25    Years: 10.00    Pack years: 2.50    Types: Cigarettes    Quit date: 03/23/1963    Years since quitting: 55.6  . Smokeless tobacco: Former Systems developer  . Tobacco comment: Quit in 1965  Substance Use Topics  . Alcohol use: No  . Drug use: No  Family Hx: The patient's family history includes Stroke in his father.  ROS:   Please see the history of present illness.    Difficulties with dementia but no Fever, chills  or productive cough All other systems reviewed and are negative.   Recent Lipid Panel Lab Results  Component Value Date/Time   CHOL 171 05/09/2012 08:55 AM   TRIG 218.0 (H) 05/09/2012 08:55 AM   TRIG 294 (HH) 02/21/2006 08:00 AM   HDL 33.50 (L) 05/09/2012 08:55 AM   CHOLHDL 5 05/09/2012 08:55 AM   LDLCALC 83 09/26/2008 10:51 AM   LDLDIRECT 99.5 05/09/2012 08:55 AM    Wt Readings from Last 3 Encounters:  11/01/18 166 lb (75.3 kg)  11/29/17 167 lb (75.8 kg)  11/26/16 163 lb (73.9 kg)     Objective:    Vital Signs:  BP 120/62   Pulse 69   Temp (!) 97.5 F (36.4 C)   Ht 5\' 10"  (1.778 m)   Wt 166 lb (75.3 kg)   BMI 23.82 kg/m    VITAL SIGNS:  reviewed NAD Answers questions appropriately Normal affect Remainder of physical examination not performed (telehealth visit; coronavirus pandemic)  ASSESSMENT & PLAN:    1.  Coronary artery disease-patient continues to do well from a symptomatic standpoint.  Continue medical therapy with aspirin.  Continue statin.  2. Hyperlipidemia-continue statin.  Lipids and liver monitored by primary care. 3. Hypertension-patient's blood pressure is controlled.  Continue present medications and follow.  Potassium and renal function monitored by primary care. 4. Carotid artery disease-mild on most recent Dopplers.  Continue aspirin.  COVID-19 Education: The importance of social distancing was discussed today.  Time:   Today, I have spent 15 minutes with the patient with telehealth technology discussing the above problems.     Medication Adjustments/Labs and Tests Ordered: Current medicines are reviewed at length with the patient today.  Concerns regarding medicines are outlined above.   Tests Ordered: No orders of the defined types were placed in this encounter.   Medication Changes: No orders of the defined types were placed in this encounter.   Follow Up:  Virtual Visit or In Person in 1 year(s)  Signed, Kirk Ruths, MD  11/01/2018 8:19 AM    [place]

## 2018-11-01 NOTE — Patient Instructions (Signed)

## 2019-01-30 DIAGNOSIS — E782 Mixed hyperlipidemia: Secondary | ICD-10-CM | POA: Diagnosis not present

## 2019-01-30 DIAGNOSIS — F0391 Unspecified dementia with behavioral disturbance: Secondary | ICD-10-CM | POA: Diagnosis not present

## 2019-01-30 DIAGNOSIS — J449 Chronic obstructive pulmonary disease, unspecified: Secondary | ICD-10-CM | POA: Diagnosis not present

## 2019-01-30 DIAGNOSIS — I10 Essential (primary) hypertension: Secondary | ICD-10-CM | POA: Diagnosis not present

## 2019-01-30 DIAGNOSIS — Z1159 Encounter for screening for other viral diseases: Secondary | ICD-10-CM | POA: Diagnosis not present

## 2019-01-30 DIAGNOSIS — I2510 Atherosclerotic heart disease of native coronary artery without angina pectoris: Secondary | ICD-10-CM | POA: Diagnosis not present

## 2019-01-30 DIAGNOSIS — N183 Chronic kidney disease, stage 3 unspecified: Secondary | ICD-10-CM | POA: Diagnosis not present

## 2019-01-30 DIAGNOSIS — H906 Mixed conductive and sensorineural hearing loss, bilateral: Secondary | ICD-10-CM | POA: Diagnosis not present

## 2019-06-13 ENCOUNTER — Other Ambulatory Visit: Payer: Self-pay | Admitting: Legal Medicine

## 2019-08-14 ENCOUNTER — Other Ambulatory Visit: Payer: Self-pay | Admitting: Legal Medicine

## 2019-09-12 ENCOUNTER — Encounter: Payer: Self-pay | Admitting: Legal Medicine

## 2019-09-12 ENCOUNTER — Ambulatory Visit (INDEPENDENT_AMBULATORY_CARE_PROVIDER_SITE_OTHER): Payer: Medicare Other | Admitting: Legal Medicine

## 2019-09-12 ENCOUNTER — Other Ambulatory Visit: Payer: Self-pay

## 2019-09-12 VITALS — BP 130/70 | HR 87 | Temp 97.3°F | Resp 16 | Ht 71.0 in | Wt 150.0 lb

## 2019-09-12 DIAGNOSIS — J449 Chronic obstructive pulmonary disease, unspecified: Secondary | ICD-10-CM | POA: Diagnosis not present

## 2019-09-12 DIAGNOSIS — E440 Moderate protein-calorie malnutrition: Secondary | ICD-10-CM | POA: Diagnosis not present

## 2019-09-12 DIAGNOSIS — N183 Chronic kidney disease, stage 3 unspecified: Secondary | ICD-10-CM

## 2019-09-12 DIAGNOSIS — I10 Essential (primary) hypertension: Secondary | ICD-10-CM

## 2019-09-12 DIAGNOSIS — E785 Hyperlipidemia, unspecified: Secondary | ICD-10-CM | POA: Insufficient documentation

## 2019-09-12 DIAGNOSIS — F0391 Unspecified dementia with behavioral disturbance: Secondary | ICD-10-CM | POA: Diagnosis not present

## 2019-09-12 DIAGNOSIS — E782 Mixed hyperlipidemia: Secondary | ICD-10-CM

## 2019-09-12 DIAGNOSIS — Z6820 Body mass index (BMI) 20.0-20.9, adult: Secondary | ICD-10-CM | POA: Diagnosis not present

## 2019-09-12 DIAGNOSIS — N189 Chronic kidney disease, unspecified: Secondary | ICD-10-CM | POA: Insufficient documentation

## 2019-09-12 DIAGNOSIS — F02818 Dementia in other diseases classified elsewhere, unspecified severity, with other behavioral disturbance: Secondary | ICD-10-CM | POA: Insufficient documentation

## 2019-09-12 NOTE — Progress Notes (Signed)
Subjective:  Patient ID: [name], male    DOB: [birth year]  Age: 86 y.o. MRN: [id number]  Chief Complaint  Patient presents with  . Hyperlipidemia  . Hypertension  . Gastroesophageal Reflux    HPI: chronic visit  Patient presents with hyperlipidemia.  Compliance with treatment has been good; patient takes medicines as directed, maintains low cholesterol diet, follows up as directed, and maintains exercise regimen.  Patient is using tricor and fish oil without problems.  Patient presents for follow up of hypertension.  Patient tolerating altace well with side effects.  Patient was diagnosed with hypertension 2010 so has been treated for hypertension for 10 years.Patient is working on maintaining diet and exercise regimen and follows up as directed. Complication include none.  Worsening dementia, he has poor memory, some sundowinng. He walks with  Rollator.  Sings and in his 0wn world  Current Outpatient Medications on File Prior to Visit  Medication Sig Dispense Refill  . acetaminophen (TYLENOL) 500 MG tablet Take 1,000 mg by mouth every 6 (six) hours as needed for mild pain, moderate pain, fever or headache.    . albuterol (PROVENTIL) (2.5 MG/3ML) 0.083% nebulizer solution USE 1 VIAL IN NEBULIZER 3 TIMES DAILY 90 mL 5  . Artificial Tear Ointment (DRY EYES OP) Place 2 drops into both eyes daily as needed (for dry eyes).    Marland Kitchen aspirin EC 81 MG tablet Take 81 mg by mouth at bedtime.    . famotidine (PEPCID) 20 MG tablet Take 20 mg by mouth 2 (two) times daily.    . fenofibrate (TRICOR) 145 MG tablet Take 145 mg by mouth daily.    . finasteride (PROSCAR) 5 MG tablet Take 5 mg by mouth every evening.    . fish oil-omega-3 fatty acids 1000 MG capsule Take 4 g by mouth 2 (two) times daily.     Marland Kitchen ipratropium (ATROVENT) 0.02 % nebulizer solution USE 1 VIAL IN NEBULIZER 3 TIMES DAILY 75 mL 11  . ramipril (ALTACE) 10 MG capsule TAKE ONE (1) CAPSULE EACH DAY 90 capsule 2   No current  facility-administered medications on file prior to visit.   Past Medical History:  Diagnosis Date  . Actinic keratosis   . Arthritis   . Back pain   . Chronic kidney disease    kidney stone  . COPD (chronic obstructive pulmonary disease) ([place])   . Coronary artery disease    a. 1988 s/p CABG x3 with LIMA->LAD, VG->OM, VG->RCA;  b. 1999 s/p inf MI and PCI/stenting of VG->PDA;  c. 05/2013 Cath: LM nl, LAD 167m, D1 90, LCX 100p, RCA 100p, LIMA->LAD nl, VG->OM nl, VG->RPDA 40-50 isr, EF 55-65%->Med Rx.  . Dementia ([place])   . Dysrhythmia   . Gastroesophageal reflux disease with ulceration   . Hemorrhoids   . Hyperlipidemia   . Hypertension   . Hypertriglyceridemia   . Joint pain   . Leg weakness   . Myocardial infarction ([place]) 1988  . Peptic ulcer disease   . Postoperative atrial fibrillation (HCC)    history of   . Unspecified dementia with behavioral disturbance [facility])    Past Surgical History:  Procedure Laterality Date  . CARDIAC CATHETERIZATION  06/2005    EF 50%. severe native CAD. SVG-RCA 50-60 othewise all grafts patent  . CERVICAL SPINE SURGERY  Apr 09, 2009  . CORONARY ANGIOPLASTY WITH STENT PLACEMENT  11/1997   status post percutaneous coronary intervention and stenting with bare metal stent of the vein graft  to the right coronary artery.  . CORONARY ARTERY BYPASS GRAFT  1988   [place] with left nternal mammary artery to left anterior descending, vein graft to the obtuse marginal, vein graft to the right coronary artery  . CYSTOSCOPY WITH LITHOLAPAXY N/A 11/29/2014   Procedure: CYSTOSCOPY WITH LITHOLAPAXY ;  Surgeon: Festus Aloe, MD;  [facility];  Service: Urology;  Laterality: N/A;  . CYSTOSCOPY WITH RETROGRADE PYELOGRAM, URETEROSCOPY AND STENT PLACEMENT Right 11/29/2014   Procedure: RIGHT  RETROGRADE PYELOGRAM, URETEROSCOPY,  AND STENT PLACEMENT;  Surgeon: Festus Aloe, MD;  [facility];  Service: Urology;  Laterality: Right;  . CYSTOSCOPY WITH  URETEROSCOPY, STONE BASKETRY AND STENT PLACEMENT Right 12/24/2014   Procedure: CYSTOSCOPY WITH RIGHT URETEROSCOPY, WITH BASKETRY EXTRACTION AND STENT PLACEMENT/EXCHANGE;  Surgeon: Festus Aloe, MD;  [facility];  Service: Urology;  Laterality: Right;  . EYE SURGERY     cataract surgery  . HEMORRHOID BANDING  2003  . HOLMIUM LASER APPLICATION Right 03/26/7827   Procedure: HOLMIUM LASER APPLICATION;  Surgeon: Festus Aloe, MD;  [facility];  Service: Urology;  Laterality: Right;  . HOLMIUM LASER APPLICATION Right 56/04/1306   Procedure: HOLMIUM LASER LITHROTRIPSY ;  Surgeon: Festus Aloe, MD;  [facility];  Service: Urology;  Laterality: Right;  . LEFT HEART CATHETERIZATION WITH CORONARY/GRAFT ANGIOGRAM N/A 06/06/2013   Procedure: LEFT HEART CATHETERIZATION WITH Beatrix Fetters;  Surgeon: Blane Ohara, MD;  Location[facility] CATH LAB;  Service: Cardiovascular;  Laterality: N/A;  . PROSTATE SURGERY  march 2009   for stone and prostate trimmed  . TRANSURETHRAL RESECTION OF PROSTATE N/A 11/29/2014   Procedure: CYSTO TRANSURETHRAL RESECTION OF THE PROSTATE (TURP);  Surgeon: Festus Aloe, MD;  [facility];  Service: Urology;  Laterality: N/A;    Family History  Problem Relation Age of Onset  . Stroke Father   . Hyperlipidemia Father   . Hypertension Father    Social History   Socioeconomic History  . Marital status: Married    Spouse name: Not on file  . Number of children: Not on file  . Years of education: Not on file  . Highest education level: Not on file  Occupational History  . Not on file  Tobacco Use  . Smoking status: Former Smoker    Packs/day: 0.25    Years: 10.00    Pack years: 2.50    Types: Cigarettes    Quit date: 03/23/1963    Years since quitting: 56.5  . Smokeless tobacco: Former Systems developer  . Tobacco comment: Quit in 1965  Vaping Use  . Vaping Use: Never used  Substance and Sexual Activity  . Alcohol use: No  . Drug use: No  .  Sexual activity: Not on file  Other Topics Concern  . Not on file  Social History Narrative  . Not on file   Social Determinants of Health   Financial Resource Strain:   . Difficulty of Paying Living Expenses:   Food Insecurity:   . Worried About Charity fundraiser in the Last Year:   . Arboriculturist in the Last Year:   Transportation Needs:   . Film/video editor (Medical):   Marland Kitchen Lack of Transportation (Non-Medical):   Physical Activity:   . Days of Exercise per Week:   . Minutes of Exercise per Session:   Stress:   . Feeling of Stress :   Social Connections:   . Frequency of Communication with Friends and Family:   .  Frequency of Social Gatherings with Friends and Family:   . Attends Religious Services:   . Active Member of Clubs or Organizations:   . Attends Archivist Meetings:   Marland Kitchen Marital Status:     Review of Systems  Constitutional: Negative.   HENT: Negative.   Eyes: Negative.   Respiratory: Negative.   Cardiovascular: Negative.   Gastrointestinal: Negative.   Endocrine: Negative.   Genitourinary: Negative.   Musculoskeletal: Negative.   Skin: Negative.   Neurological: Negative.        Walks with rollator  Psychiatric/Behavioral: Positive for behavioral problems, confusion and decreased concentration.     Objective:  BP 130/70   Pulse 87   Temp (!) 97.3 F (36.3 C)   Resp 16   Ht 5\' 11"  (1.803 m)   Wt 150 lb (68 kg)   SpO2 98%   BMI 20.92 kg/m   BP/Weight 09/12/2019 11/01/2018 10/04/9676  Systolic BP 938 101 98  Diastolic BP 70 62 58  Wt. (Lbs) 150 166 167  BMI 20.92 23.82 23.96    Physical Exam Vitals reviewed.  Constitutional:      Appearance: Normal appearance.  HENT:     Head: Normocephalic and atraumatic.     Right Ear: Tympanic membrane normal.     Left Ear: Tympanic membrane normal.     Nose: Nose normal.     Mouth/Throat:     Mouth: Mucous membranes are dry.  Cardiovascular:     Rate and Rhythm: Normal rate and  regular rhythm.     Pulses: Normal pulses.     Heart sounds: Normal heart sounds.  Pulmonary:     Effort: Pulmonary effort is normal.     Breath sounds: Normal breath sounds.  Musculoskeletal:     Cervical back: Normal range of motion.     Comments: heberdens nodes, weakness lower extremity  Skin:    General: Skin is warm and dry.     Capillary Refill: Capillary refill takes less than 2 seconds.  Neurological:     General: No focal deficit present.     Mental Status: He is alert.  Psychiatric:     Comments: Unable to assess mental status     Lab Results  Component Value Date   WBC 4.6 12/20/2014   HGB 12.9 (L) 12/20/2014   HCT 38.8 (L) 12/20/2014   PLT 190 12/20/2014   GLUCOSE 100 (H) 12/20/2014   CHOL 171 05/09/2012   TRIG 218.0 (H) 05/09/2012   HDL 33.50 (L) 05/09/2012   LDLDIRECT 99.5 05/09/2012   LDLCALC 83 09/26/2008   ALT 18 05/09/2012   AST 18 05/09/2012   NA 137 12/20/2014   K 3.8 12/20/2014   CL 107 12/20/2014   CREATININE 1.95 (H) 12/20/2014   BUN 28 (H) 12/20/2014   CO2 26 12/20/2014   TSH 0.668 06/05/2013   INR 1.05 06/05/2013   HGBA1C 5.4 08/20/2009      Assessment & Plan:   1. Essential hypertension - CBC with Differential/Platelet - Comprehensive metabolic panel An individual hypertension care plan was established and reinforced today.  The patient's status was assessed using clinical findings on exam and labs or diagnostic tests. The patient's success at meeting treatment goals on disease specific evidence-based guidelines and found to be well controlled. SELF MANAGEMENT: The patient and I together assessed ways to personally work towards obtaining the recommended goals. RECOMMENDATIONS: avoid decongestants found in common cold remedies, decrease consumption of alcohol, perform routine monitoring of BP with home  BP cuff, exercise, reduction of dietary salt, take medicines as prescribed, try not to miss doses and quit smoking.  Regular exercise and  maintaining a healthy weight is needed.  Stress reduction may help. A CLINICAL SUMMARY including written plan identify barriers to care unique to individual due to social or financial issues.  We attempt to mutually creat solutions for individual and family understanding.  2. Mixed hyperlipidemia - Lipid panel AN INDIVIDUAL CARE PLAN for hyperlipidemia/ cholesterol was established and reinforced today.  The patient's status was assessed using clinical findings on exam, lab and other diagnostic tests. The patient's disease status was assessed based on evidence-based guidelines and found to be well controlled. MEDICATIONS were reviewed. SELF MANAGEMENT GOALS have been discussed and patient's success at attaining the goal of low cholesterol was assessed. RECOMMENDATION given include regular exercise 3 days a week and low cholesterol/low fat diet. CLINICAL SUMMARY including written plan to identify barriers unique to the patient due to social or economic  reasons was discussed.  3. Chronic obstructive pulmonary disease, unspecified COPD type ([place]) An individualize plan was formulated for care of COPD.  Treatment is evidence based.  She will continue on inhalers, avoid smoking and smoke.  Regular exercise with help with dyspnea. Routine follow ups and medication compliance is needed.  4. Stage 3 chronic kidney disease, unspecified whether stage 3a or 3b CKD AN INDIVIDUAL CARE PLAN for renal diseasewas established and reinforced today.  The patient's status was assessed using clinical findings on exam, labs, and other diagnostic testing. Patient's success at meeting treatment goals based on disease specific evidence-bassed guidelines and found to be in fair control. RECOMMENDATIONS include maintaining present medicines and treatment.  5. Dementia with behavioral disturbance, unspecified dementia type [facility]) Patient is doing fair with assistance at home.  He uses rollator to ambulate short distances.   MMSE 2/30  6. BMI 20.0-20.9, adult Supplement nutrition with protein/calorie supplement with meals to improve nutritional status. His BMI is dropping  7. Malnutrition of moderate degree (HCC) Supplement nutrition with protein/calorie supplement with meals to improve nutritional status. Take twice a day to improve status.    No orders of the defined types were placed in this encounter.   Orders Placed This Encounter  Procedures  . CBC with Differential/Platelet  . Comprehensive metabolic panel  . Lipid panel     Follow-up: Return in about 6 months (around 03/13/2020) for fasting.  An After Visit Summary was printed and given to the patient.  [place] [phone]

## 2019-09-13 LAB — COMPREHENSIVE METABOLIC PANEL
ALT: 18 IU/L (ref 0–44)
AST: 19 IU/L (ref 0–40)
Albumin/Globulin Ratio: 2.2 (ref 1.2–2.2)
Albumin: 4.2 g/dL (ref 3.6–4.6)
Alkaline Phosphatase: 33 IU/L — ABNORMAL LOW (ref 48–121)
BUN/Creatinine Ratio: 17 (ref 10–24)
BUN: 27 mg/dL (ref 8–27)
Bilirubin Total: 0.6 mg/dL (ref 0.0–1.2)
CO2: 22 mmol/L (ref 20–29)
Calcium: 9.5 mg/dL (ref 8.6–10.2)
Chloride: 105 mmol/L (ref 96–106)
Creatinine, Ser: 1.57 mg/dL — ABNORMAL HIGH (ref 0.76–1.27)
GFR calc Af Amer: 45 mL/min/{1.73_m2} — ABNORMAL LOW (ref >59)
GFR calc non Af Amer: 39 mL/min/{1.73_m2} — ABNORMAL LOW (ref >59)
Globulin, Total: 1.9 g/dL (ref 1.5–4.5)
Glucose: 89 mg/dL (ref 65–99)
Potassium: 4.4 mmol/L (ref 3.5–5.2)
Sodium: 141 mmol/L (ref 134–144)
Total Protein: 6.1 g/dL (ref 6.0–8.5)

## 2019-09-13 LAB — CBC WITH DIFFERENTIAL/PLATELET
Basophils Absolute: 0 10*3/uL (ref 0.0–0.2)
Basos: 1 %
EOS (ABSOLUTE): 0.2 10*3/uL (ref 0.0–0.4)
Eos: 3 %
Hematocrit: 44.9 % (ref 37.5–51.0)
Hemoglobin: 15.6 g/dL (ref 13.0–17.7)
Immature Grans (Abs): 0 10*3/uL (ref 0.0–0.1)
Immature Granulocytes: 0 %
Lymphocytes Absolute: 0.8 10*3/uL (ref 0.7–3.1)
Lymphs: 18 %
MCH: 31.7 pg (ref 26.6–33.0)
MCHC: 34.7 g/dL (ref 31.5–35.7)
MCV: 91 fL (ref 79–97)
Monocytes Absolute: 0.4 10*3/uL (ref 0.1–0.9)
Monocytes: 9 %
Neutrophils Absolute: 3 10*3/uL (ref 1.4–7.0)
Neutrophils: 69 %
Platelets: 172 10*3/uL (ref 150–450)
RBC: 4.92 x10E6/uL (ref 4.14–5.80)
RDW: 13.4 % (ref 11.6–15.4)
WBC: 4.4 10*3/uL (ref 3.4–10.8)

## 2019-09-13 LAB — LIPID PANEL
Chol/HDL Ratio: 7.6 ratio — ABNORMAL HIGH (ref 0.0–5.0)
Cholesterol, Total: 242 mg/dL — ABNORMAL HIGH (ref 100–199)
HDL: 32 mg/dL — ABNORMAL LOW (ref >39)
LDL Chol Calc (NIH): 166 mg/dL — ABNORMAL HIGH (ref 0–99)
Triglycerides: 236 mg/dL — ABNORMAL HIGH (ref 0–149)
VLDL Cholesterol Cal: 44 mg/dL — ABNORMAL HIGH (ref 5–40)

## 2019-09-13 LAB — CARDIOVASCULAR RISK ASSESSMENT

## 2019-09-13 NOTE — Progress Notes (Signed)
Anemia improving, kidney tests improving, liver tests normal, Cholesterol very high- needs statin especially with CAD lp

## 2019-11-12 NOTE — Progress Notes (Signed)
Virtual Visit via Video Note   This visit type was conducted due to national recommendations for restrictions regarding the COVID-19 Pandemic (e.g. social distancing) in an effort to limit this patient's exposure and mitigate transmission in our community.  Due to his co-morbid illnesses, this patient is at least at moderate risk for complications without adequate follow up.  This format is felt to be most appropriate for this patient at this time.  All issues noted in this document were discussed and addressed.  A limited physical exam was performed with this format.  Please refer to the patient's chart for his consent to telehealth for [facility].      Date:  11/20/2019   ID:  Pamala Hurry, DOB January 18, 1929, MRN [id number]  Patient Location:Home Provider Location: Home  PCP:  [name], MD  Cardiologist:  Dr Stanford Breed  Evaluation Performed:  Follow-Up Visit  Chief Complaint:  FU CAD  History of Present Illness:    FU coronary artery disease. He is status post bypass grafting in 1988 and 1999. He had an inferior infarct and underwent stenting of the vein graft to the right coronary artery. Abdominal CT in 2000 showed no aneurysm. Carotid Dopplers in Dec 2013 showed 0-39% stenosis bilaterally. ABIs in November of 2011 were normal. Cardiac catheterization in March of 2015 showed severe three-vessel coronary disease with occlusion of all native vessels. The LIMA to the LAD, saphenous vein graft to the acute marginal and saphenous vein graft to the PDA were patent. LV function normal. There was note of a tight lesion in a small diagonal branch and PCI could be considered for refractory symptoms. Echocardiogram in March 2015 showed normal LV function, grade 1 diastolic dysfunction and mild left atrial enlargement. Since last seen,patient's dementia has worsened per his wife.  He denies dyspnea, chest pain.  No syncope.  The patient does not have symptoms concerning for COVID-19  infection (fever, chills, cough, or new shortness of breath).    Past Medical History:  Diagnosis Date  . Actinic keratosis   . Arthritis   . Back pain   . Chronic kidney disease    kidney stone  . COPD (chronic obstructive pulmonary disease) ([place])   . Coronary artery disease    a. 1988 s/p CABG x3 with LIMA->LAD, VG->OM, VG->RCA;  b. 1999 s/p inf MI and PCI/stenting of VG->PDA;  c. 05/2013 Cath: LM nl, LAD 141m, D1 90, LCX 100p, RCA 100p, LIMA->LAD nl, VG->OM nl, VG->RPDA 40-50 isr, EF 55-65%->Med Rx.  . Dementia ([place])   . Dysrhythmia   . Gastroesophageal reflux disease with ulceration   . Hemorrhoids   . Hyperlipidemia   . Hypertension   . Hypertriglyceridemia   . Joint pain   . Leg weakness   . Myocardial infarction ([place]) 1988  . Peptic ulcer disease   . Postoperative atrial fibrillation (HCC)    history of   . Unspecified dementia with behavioral disturbance [facility])    Past Surgical History:  Procedure Laterality Date  . CARDIAC CATHETERIZATION  06/2005    EF 50%. severe native CAD. SVG-RCA 50-60 othewise all grafts patent  . CERVICAL SPINE SURGERY  Apr 09, 2009  . CORONARY ANGIOPLASTY WITH STENT PLACEMENT  11/1997   status post percutaneous coronary intervention and stenting with bare metal stent of the vein graft to the right coronary artery.  . CORONARY ARTERY BYPASS GRAFT  1988   [place] with left nternal mammary artery to left anterior descending, vein graft to  the obtuse marginal, vein graft to the right coronary artery  . CYSTOSCOPY WITH LITHOLAPAXY N/A 11/29/2014   Procedure: CYSTOSCOPY WITH LITHOLAPAXY ;  Surgeon: Festus Aloe, MD;  Location: [facility];  Service: Urology;  Laterality: N/A;  . CYSTOSCOPY WITH RETROGRADE PYELOGRAM, URETEROSCOPY AND STENT PLACEMENT Right 11/29/2014   Procedure: RIGHT  RETROGRADE PYELOGRAM, URETEROSCOPY,  AND STENT PLACEMENT;  Surgeon: Festus Aloe, MD;  [facility];  Service: Urology;  Laterality: Right;  . CYSTOSCOPY WITH  URETEROSCOPY, STONE BASKETRY AND STENT PLACEMENT Right 12/24/2014   Procedure: CYSTOSCOPY WITH RIGHT URETEROSCOPY, WITH BASKETRY EXTRACTION AND STENT PLACEMENT/EXCHANGE;  Surgeon: Festus Aloe, MD;  [facility];  Service: Urology;  Laterality: Right;  . EYE SURGERY     cataract surgery  . HEMORRHOID BANDING  2003  . HOLMIUM LASER APPLICATION Right 07/21/7614   Procedure: HOLMIUM LASER APPLICATION;  Surgeon: Festus Aloe, MD;  [facility];  Service: Urology;  Laterality: Right;  . HOLMIUM LASER APPLICATION Right 09/21/7104   Procedure: HOLMIUM LASER LITHROTRIPSY ;  Surgeon: Festus Aloe, MD;  [facility];  Service: Urology;  Laterality: Right;  . LEFT HEART CATHETERIZATION WITH CORONARY/GRAFT ANGIOGRAM N/A 06/06/2013   Procedure: LEFT HEART CATHETERIZATION WITH Beatrix Fetters;  Surgeon: Blane Ohara, MD;  Location[facility] CATH LAB;  Service: Cardiovascular;  Laterality: N/A;  . PROSTATE SURGERY  march 2009   for stone and prostate trimmed  . TRANSURETHRAL RESECTION OF PROSTATE N/A 11/29/2014   Procedure: CYSTO TRANSURETHRAL RESECTION OF THE PROSTATE (TURP);  Surgeon: Festus Aloe, MD;  [facility];  Service: Urology;  Laterality: N/A;     Current Meds  Medication Sig  . acetaminophen (TYLENOL) 500 MG tablet Take 1,000 mg by mouth every 6 (six) hours as needed for mild pain, moderate pain, fever or headache.  . albuterol (PROVENTIL) (2.5 MG/3ML) 0.083% nebulizer solution USE 1 VIAL IN NEBULIZER 3 TIMES DAILY  . Artificial Tear Ointment (DRY EYES OP) Place 2 drops into both eyes daily as needed (for dry eyes).  Marland Kitchen aspirin EC 81 MG tablet Take 81 mg by mouth at bedtime.  . famotidine (PEPCID) 20 MG tablet Take 20 mg by mouth 2 (two) times daily.  . fenofibrate (TRICOR) 145 MG tablet Take 145 mg by mouth daily.  . finasteride (PROSCAR) 5 MG tablet Take 5 mg by mouth every evening.  . fish oil-omega-3 fatty acids 1000 MG capsule Take 4 g by mouth 2 (two)  times daily.   Marland Kitchen ipratropium (ATROVENT) 0.02 % nebulizer solution USE 1 VIAL IN NEBULIZER 3 TIMES DAILY  . ramipril (ALTACE) 10 MG capsule TAKE ONE (1) CAPSULE EACH DAY     Allergies:   Penicillins   Social History   Tobacco Use  . Smoking status: Former Smoker    Packs/day: 0.25    Years: 10.00    Pack years: 2.50    Types: Cigarettes    Quit date: 03/23/1963    Years since quitting: 56.7  . Smokeless tobacco: Former Systems developer  . Tobacco comment: Quit in 1965  Vaping Use  . Vaping Use: Never used  Substance Use Topics  . Alcohol use: No  . Drug use: No     Family Hx: The patient's family history includes Hyperlipidemia in his father; Hypertension in his father; Stroke in his father.  ROS:   Please see the history of present illness.    No Fever, chills  or productive cough All other systems reviewed and are negative.   Recent Labs:  09/12/2019: ALT 18; BUN 27; Creatinine, Ser 1.57; Hemoglobin 15.6; Platelets 172; Potassium 4.4; Sodium 141   Recent Lipid Panel Lab Results  Component Value Date/Time   CHOL 242 (H) 09/12/2019 08:15 AM   TRIG 236 (H) 09/12/2019 08:15 AM   TRIG 294 (HH) 02/21/2006 08:00 AM   HDL 32 (L) 09/12/2019 08:15 AM   CHOLHDL 7.6 (H) 09/12/2019 08:15 AM   CHOLHDL 5 05/09/2012 08:55 AM   LDLCALC 166 (H) 09/12/2019 08:15 AM   LDLDIRECT 99.5 05/09/2012 08:55 AM    Wt Readings from Last 3 Encounters:  11/20/19 162 lb (73.5 kg)  09/12/19 150 lb (68 kg)  11/01/18 166 lb (75.3 kg)     Objective:    Vital Signs:  BP 118/62   Pulse 68   Temp (!) 97.1 F (36.2 C)   Resp 18   Ht 5\' 11"  (1.803 m)   Wt 162 lb (73.5 kg)   BMI 22.59 kg/m    VITAL SIGNS:  reviewed NAD Answers questions appropriately Normal affect Remainder of physical examination not performed (telehealth visit; coronavirus pandemic)  ASSESSMENT & PLAN:    1. Coronary artery disease-patient denies chest pain.  Continue aspirin; intolerant to statins. 2. Hypertension-continue  present medications and follow. 3. Hyperlipidemia-patient is intolerant to statins.  We will plan conservative measures particularly in light of his significant dementia. 4. Carotid artery disease-mild on most recent Dopplers. 5. Dementia-Per primary care.  COVID-19 Education: The importance of social distancing was discussed today.  Time:   Today, I have spent 14 minutes with the patient with telehealth technology discussing the above problems.     Medication Adjustments/Labs and Tests Ordered: Current medicines are reviewed at length with the patient today.  Concerns regarding medicines are outlined above.   Tests Ordered: No orders of the defined types were placed in this encounter.   Medication Changes: No orders of the defined types were placed in this encounter.   Follow Up:  In Person in 1 year(s)  Signed, Kirk Ruths, MD  11/20/2019 9:12 AM    [place]

## 2019-11-20 ENCOUNTER — Encounter: Payer: Self-pay | Admitting: Cardiology

## 2019-11-20 ENCOUNTER — Telehealth (INDEPENDENT_AMBULATORY_CARE_PROVIDER_SITE_OTHER): Payer: Medicare Other | Admitting: Cardiology

## 2019-11-20 VITALS — BP 118/62 | HR 68 | Temp 97.1°F | Resp 18 | Ht 71.0 in | Wt 162.0 lb

## 2019-11-20 DIAGNOSIS — I25810 Atherosclerosis of coronary artery bypass graft(s) without angina pectoris: Secondary | ICD-10-CM | POA: Diagnosis not present

## 2019-11-20 DIAGNOSIS — E7800 Pure hypercholesterolemia, unspecified: Secondary | ICD-10-CM | POA: Diagnosis not present

## 2019-11-20 DIAGNOSIS — I10 Essential (primary) hypertension: Secondary | ICD-10-CM | POA: Diagnosis not present

## 2019-11-20 NOTE — Patient Instructions (Signed)
Medication Instructions:  NO CHANGE *If you need a refill on your cardiac medications before your next appointment, please call your pharmacy*   Lab Work: If you have labs (blood work) drawn today and your tests are completely normal, you will receive your results only by: Marland Kitchen MyChart Message (if you have MyChart) OR . A paper copy in the mail If you have any lab test that is abnormal or we need to change your treatment, we will call you to review the results.   Follow-Up: At [facility], you and your health needs are our priority.  As part of our continuing mission to provide you with exceptional heart care, we have created designated Provider Care Teams.  These Care Teams include your primary Cardiologist (physician) and Advanced Practice Providers (APPs -  Physician Assistants and Nurse Practitioners) who all work together to provide you with the care you need, when you need it.  We recommend signing up for the patient portal called "MyChart".  Sign up information is provided on this After Visit Summary.  MyChart is used to connect with patients for Virtual Visits (Telemedicine).  Patients are able to view lab/test results, encounter notes, upcoming appointments, etc.  Non-urgent messages can be sent to your provider as well.   To learn more about what you can do with MyChart, go to [email or web address].    Your next appointment:   12 month(s)  The format for your next appointment:   Virtual Visit   Provider:   You may see Kirk Ruths MD or one of the following Advanced Practice Providers on your designated Care Team:    Kerin Ransom, PA-C  [place], [place]  Coletta Memos, [place]

## 2020-01-07 ENCOUNTER — Other Ambulatory Visit: Payer: Self-pay | Admitting: Legal Medicine

## 2020-01-07 DIAGNOSIS — R3911 Hesitancy of micturition: Secondary | ICD-10-CM

## 2020-01-07 DIAGNOSIS — E782 Mixed hyperlipidemia: Secondary | ICD-10-CM

## 2020-01-07 DIAGNOSIS — N401 Enlarged prostate with lower urinary tract symptoms: Secondary | ICD-10-CM

## 2020-02-11 DIAGNOSIS — E860 Dehydration: Secondary | ICD-10-CM | POA: Diagnosis present

## 2020-02-11 DIAGNOSIS — N179 Acute kidney failure, unspecified: Secondary | ICD-10-CM | POA: Diagnosis not present

## 2020-02-11 DIAGNOSIS — Z87891 Personal history of nicotine dependence: Secondary | ICD-10-CM | POA: Diagnosis not present

## 2020-02-11 DIAGNOSIS — I951 Orthostatic hypotension: Secondary | ICD-10-CM | POA: Diagnosis present

## 2020-02-11 DIAGNOSIS — R55 Syncope and collapse: Secondary | ICD-10-CM | POA: Diagnosis not present

## 2020-02-11 DIAGNOSIS — J189 Pneumonia, unspecified organism: Secondary | ICD-10-CM | POA: Diagnosis present

## 2020-02-11 DIAGNOSIS — R531 Weakness: Secondary | ICD-10-CM | POA: Diagnosis not present

## 2020-02-11 DIAGNOSIS — I340 Nonrheumatic mitral (valve) insufficiency: Secondary | ICD-10-CM | POA: Diagnosis not present

## 2020-02-11 DIAGNOSIS — J441 Chronic obstructive pulmonary disease with (acute) exacerbation: Secondary | ICD-10-CM | POA: Diagnosis not present

## 2020-02-11 DIAGNOSIS — I6782 Cerebral ischemia: Secondary | ICD-10-CM | POA: Diagnosis not present

## 2020-02-11 DIAGNOSIS — J9811 Atelectasis: Secondary | ICD-10-CM | POA: Diagnosis not present

## 2020-02-11 DIAGNOSIS — N183 Chronic kidney disease, stage 3 unspecified: Secondary | ICD-10-CM | POA: Diagnosis present

## 2020-02-11 DIAGNOSIS — I2510 Atherosclerotic heart disease of native coronary artery without angina pectoris: Secondary | ICD-10-CM | POA: Diagnosis present

## 2020-02-11 DIAGNOSIS — Z8701 Personal history of pneumonia (recurrent): Secondary | ICD-10-CM | POA: Diagnosis not present

## 2020-02-11 DIAGNOSIS — I700 Atherosclerosis of aorta: Secondary | ICD-10-CM | POA: Diagnosis not present

## 2020-02-11 DIAGNOSIS — R0902 Hypoxemia: Secondary | ICD-10-CM | POA: Diagnosis not present

## 2020-02-11 DIAGNOSIS — I361 Nonrheumatic tricuspid (valve) insufficiency: Secondary | ICD-10-CM | POA: Diagnosis not present

## 2020-02-11 DIAGNOSIS — I10 Essential (primary) hypertension: Secondary | ICD-10-CM | POA: Diagnosis not present

## 2020-02-11 DIAGNOSIS — F0390 Unspecified dementia without behavioral disturbance: Secondary | ICD-10-CM | POA: Diagnosis present

## 2020-02-11 DIAGNOSIS — K449 Diaphragmatic hernia without obstruction or gangrene: Secondary | ICD-10-CM | POA: Diagnosis not present

## 2020-02-11 DIAGNOSIS — R4182 Altered mental status, unspecified: Secondary | ICD-10-CM | POA: Diagnosis not present

## 2020-02-11 DIAGNOSIS — R9082 White matter disease, unspecified: Secondary | ICD-10-CM | POA: Diagnosis not present

## 2020-02-11 DIAGNOSIS — R4020 Unspecified coma: Secondary | ICD-10-CM | POA: Diagnosis not present

## 2020-02-11 DIAGNOSIS — G319 Degenerative disease of nervous system, unspecified: Secondary | ICD-10-CM | POA: Diagnosis not present

## 2020-02-11 DIAGNOSIS — N178 Other acute kidney failure: Secondary | ICD-10-CM | POA: Diagnosis present

## 2020-02-11 DIAGNOSIS — I129 Hypertensive chronic kidney disease with stage 1 through stage 4 chronic kidney disease, or unspecified chronic kidney disease: Secondary | ICD-10-CM | POA: Diagnosis not present

## 2020-02-11 DIAGNOSIS — N390 Urinary tract infection, site not specified: Secondary | ICD-10-CM | POA: Diagnosis not present

## 2020-02-11 DIAGNOSIS — J440 Chronic obstructive pulmonary disease with acute lower respiratory infection: Secondary | ICD-10-CM | POA: Diagnosis present

## 2020-02-18 ENCOUNTER — Telehealth: Payer: Self-pay

## 2020-02-18 DIAGNOSIS — J189 Pneumonia, unspecified organism: Secondary | ICD-10-CM

## 2020-02-18 NOTE — Telephone Encounter (Signed)
  Transition Care Management Follow-up Telephone Call  [name] [birth year]  Admit Date: 02/11/20 Discharge Date: 02/14/20 Diagnoses: PNA, Acute kidney failure, UTI   2 day post discharge: 02/16/20 7 day post discharge: 02/21/20 14 day post discharge: 02/28/20  [name] was discharged from [facility] on November 25 (Thanksgiving Day) with the diagnoses listed above.  He was contacted today via telephone in regards to transition of care.  I spoke with his wife, [name], due to his advanced dementia.  She states that he seems to be back to his baseline before admission.  During his hospital stay he was started on Cefdinir 300 mg PO BID for 7 days and was given Micinex 600 mg PO BID.  BUN on admission was 25 and was 34 at discharge.  Creatinine on admission was 1.80 and was 1.30 at discharge.   CT Head showed no acute findings CT Chest: cluster of small nodules noted laterally in left upper lobe -- 97M follow-up recommended to rule out neoplasm, Moderate size sliding-type hiatal hernia, aortic arthrosclerosis ECHO: EF 60-65%, mild mitral regurgitation is present    Discharge Instructions: Follow-up with PCP in one week, recheck kidney function.  Chest CT recommended in 3 months for nodules found in left upper lobe  Items Reviewed:  Medications reviewed: yes  Allergies reviewed: yes  Dietary changes reviewed: yes  Referrals reviewed: yes   Functional Questionnaire:    Any transportation issues/concerns?: yes - daughter will help wife getting patient to office   Any patient concerns? no   Confirmed importance and date/time of follow-up visits scheduled yes  Provider Appointment booked with Dr Henrene Pastor  Confirmed with patient if condition begins to worsen call PCP or go to the ER.  Patient was given the office number and encouraged to call back with question or concerns.  : yes   Shelle Iron, LPN 79/15/05 6:97 AM

## 2020-02-22 ENCOUNTER — Other Ambulatory Visit: Payer: Self-pay

## 2020-02-22 ENCOUNTER — Encounter: Payer: Self-pay | Admitting: Legal Medicine

## 2020-02-22 ENCOUNTER — Ambulatory Visit (INDEPENDENT_AMBULATORY_CARE_PROVIDER_SITE_OTHER): Payer: Medicare Other | Admitting: Legal Medicine

## 2020-02-22 VITALS — BP 110/78 | HR 76 | Temp 97.3°F | Ht 71.0 in | Wt 143.0 lb

## 2020-02-22 DIAGNOSIS — J189 Pneumonia, unspecified organism: Secondary | ICD-10-CM

## 2020-02-22 DIAGNOSIS — F0391 Unspecified dementia with behavioral disturbance: Secondary | ICD-10-CM | POA: Diagnosis not present

## 2020-02-22 DIAGNOSIS — N3000 Acute cystitis without hematuria: Secondary | ICD-10-CM

## 2020-02-22 DIAGNOSIS — Z23 Encounter for immunization: Secondary | ICD-10-CM | POA: Diagnosis not present

## 2020-02-22 DIAGNOSIS — N390 Urinary tract infection, site not specified: Secondary | ICD-10-CM | POA: Insufficient documentation

## 2020-02-22 NOTE — Progress Notes (Signed)
Subjective:  Patient ID: [name], male    DOB: 1928-05-17  Age: 86 y.o. MRN: [id number]  Chief Complaint  Patient presents with  . Hospitalization Follow-up    HPI  Patient following up from [facility] was treated for a bladder infection and pneumonia.  This is a transition f care and reconciliation of medicines.  Patient admitted to [facility] on11/23/2021 for pneumonia, acute renal failure, and UTI.  CT head did not show new stroke.  His creatinine was 1.3, they rehydrated and IV antibiotics.  His DAT made behavioral problems.  Discharged on 02/14/2020 Current Outpatient Medications on File Prior to Visit  Medication Sig Dispense Refill  . acetaminophen (TYLENOL) 500 MG tablet Take 1,000 mg by mouth every 6 (six) hours as needed for mild pain, moderate pain, fever or headache.    . albuterol (PROVENTIL) (2.5 MG/3ML) 0.083% nebulizer solution USE 1 VIAL IN NEBULIZER 3 TIMES DAILY 90 mL 5  . Artificial Tear Ointment (DRY EYES OP) Place 2 drops into both eyes daily as needed (for dry eyes).    Marland Kitchen aspirin EC 81 MG tablet Take 81 mg by mouth at bedtime.    . famotidine (PEPCID) 20 MG tablet Take 20 mg by mouth 2 (two) times daily.    . fenofibrate (TRICOR) 145 MG tablet TAKE ONE (1) TABLET ONCE DAILY 90 tablet 2  . finasteride (PROSCAR) 5 MG tablet TAKE ONE (1) TABLET ONCE DAILY 90 tablet 2  . fish oil-omega-3 fatty acids 1000 MG capsule Take 4 g by mouth 2 (two) times daily.     Marland Kitchen guaiFENesin (MUCINEX) 600 MG 12 hr tablet Take 600 mg by mouth 2 (two) times daily.    Marland Kitchen ipratropium (ATROVENT) 0.02 % nebulizer solution USE 1 VIAL IN NEBULIZER 3 TIMES DAILY 75 mL 11  . ramipril (ALTACE) 10 MG capsule TAKE ONE (1) CAPSULE EACH DAY 90 capsule 2   No current facility-administered medications on file prior to visit.   Past Medical History:  Diagnosis Date  . Arthritis   . Chronic kidney disease    kidney stone  . COPD (chronic obstructive pulmonary disease) ([place])   . Dementia ([place])   .  Hyperlipidemia   . Leg weakness   . Postoperative atrial fibrillation (HCC)    history of   . Unspecified dementia with behavioral disturbance [facility])    Past Surgical History:  Procedure Laterality Date  . CARDIAC CATHETERIZATION  06/2005    EF 50%. severe native CAD. SVG-RCA 50-60 othewise all grafts patent  . CERVICAL SPINE SURGERY  Apr 09, 2009  . CORONARY ANGIOPLASTY WITH STENT PLACEMENT  11/1997   status post percutaneous coronary intervention and stenting with bare metal stent of the vein graft to the right coronary artery.  . CORONARY ARTERY BYPASS GRAFT  1988   [place] with left nternal mammary artery to left anterior descending, vein graft to the obtuse marginal, vein graft to the right coronary artery  . CYSTOSCOPY WITH LITHOLAPAXY N/A 11/29/2014   Procedure: CYSTOSCOPY WITH LITHOLAPAXY ;  Surgeon: Festus Aloe, MD;  [facility];  Service: Urology;  Laterality: N/A;  . CYSTOSCOPY WITH RETROGRADE PYELOGRAM, URETEROSCOPY AND STENT PLACEMENT Right 11/29/2014   Procedure: RIGHT  RETROGRADE PYELOGRAM, URETEROSCOPY,  AND STENT PLACEMENT;  Surgeon: Festus Aloe, MD;  [facility];  Service: Urology;  Laterality: Right;  . CYSTOSCOPY WITH URETEROSCOPY, STONE BASKETRY AND STENT PLACEMENT Right 12/24/2014   Procedure: CYSTOSCOPY WITH RIGHT URETEROSCOPY, WITH BASKETRY EXTRACTION AND STENT  PLACEMENT/EXCHANGE;  Surgeon: Festus Aloe, MD;  [facility];  Service: Urology;  Laterality: Right;  . EYE SURGERY     cataract surgery  . HEMORRHOID BANDING  2003  . HOLMIUM LASER APPLICATION Right 09/24/1948   Procedure: HOLMIUM LASER APPLICATION;  Surgeon: Festus Aloe, MD;  [facility];  Service: Urology;  Laterality: Right;  . HOLMIUM LASER APPLICATION Right 93/04/6710   Procedure: HOLMIUM LASER LITHROTRIPSY ;  Surgeon: Festus Aloe, MD;  [facility];  Service: Urology;  Laterality: Right;  . LEFT HEART CATHETERIZATION WITH CORONARY/GRAFT ANGIOGRAM N/A 06/06/2013    Procedure: LEFT HEART CATHETERIZATION WITH Beatrix Fetters;  Surgeon: Blane Ohara, MD;  Location: [facility] CATH LAB;  Service: Cardiovascular;  Laterality: N/A;  . PROSTATE SURGERY  march 2009   for stone and prostate trimmed  . TRANSURETHRAL RESECTION OF PROSTATE N/A 11/29/2014   Procedure: CYSTO TRANSURETHRAL RESECTION OF THE PROSTATE (TURP);  Surgeon: Festus Aloe, MD;  [facility];  Service: Urology;  Laterality: N/A;    Family History  Problem Relation Age of Onset  . Stroke Father   . Hyperlipidemia Father   . Hypertension Father    Social History   Socioeconomic History  . Marital status: Married    Spouse name: Not on file  . Number of children: Not on file  . Years of education: Not on file  . Highest education level: Not on file  Occupational History  . Not on file  Tobacco Use  . Smoking status: Former Smoker    Packs/day: 0.25    Years: 10.00    Pack years: 2.50    Types: Cigarettes    Quit date: 03/23/1963    Years since quitting: 56.9  . Smokeless tobacco: Former Systems developer  . Tobacco comment: Quit in 1965  Vaping Use  . Vaping Use: Never used  Substance and Sexual Activity  . Alcohol use: No  . Drug use: No  . Sexual activity: Not on file  Other Topics Concern  . Not on file  Social History Narrative  . Not on file   Social Determinants of Health   Financial Resource Strain:   . Difficulty of Paying Living Expenses: Not on file  Food Insecurity:   . Worried About Charity fundraiser in the Last Year: Not on file  . Ran Out of Food in the Last Year: Not on file  Transportation Needs:   . Lack of Transportation (Medical): Not on file  . Lack of Transportation (Non-Medical): Not on file  Physical Activity:   . Days of Exercise per Week: Not on file  . Minutes of Exercise per Session: Not on file  Stress:   . Feeling of Stress : Not on file  Social Connections:   . Frequency of Communication with Friends and Family: Not on file  .  Frequency of Social Gatherings with Friends and Family: Not on file  . Attends Religious Services: Not on file  . Active Member of Clubs or Organizations: Not on file  . Attends Archivist Meetings: Not on file  . Marital Status: Not on file    Review of Systems  Constitutional: Negative for chills and fever.  HENT: Negative for congestion and rhinorrhea.   Respiratory: Negative for cough and shortness of breath.   Cardiovascular: Negative for chest pain, palpitations and leg swelling.  Gastrointestinal: Negative for abdominal distention and abdominal pain.  Genitourinary: Negative for dysuria, frequency and urgency.  Musculoskeletal: Positive for arthralgias.  Neurological:  DAT  Psychiatric/Behavioral: Positive for confusion. Negative for behavioral problems.     Objective:  BP 110/78   Pulse 76   Temp (!) 97.3 F (36.3 C)   Ht 5\' 11"  (1.803 m)   Wt 143 lb (64.9 kg)   SpO2 99%   BMI 19.94 kg/m   BP/Weight 02/22/2020 11/20/2019 0/98/1191  Systolic BP 478 295 621  Diastolic BP 78 62 70  Wt. (Lbs) 143 162 150  BMI 19.94 22.59 20.92    Physical Exam Vitals reviewed.  Constitutional:      Appearance: Normal appearance.  HENT:     Right Ear: Tympanic membrane normal.     Left Ear: Tympanic membrane normal.  Eyes:     Extraocular Movements: Extraocular movements intact.     Conjunctiva/sclera: Conjunctivae normal.     Pupils: Pupils are equal, round, and reactive to light.  Cardiovascular:     Rate and Rhythm: Normal rate and regular rhythm.     Pulses: Normal pulses.  Pulmonary:     Effort: Pulmonary effort is normal.     Breath sounds: Normal breath sounds.  Abdominal:     General: Abdomen is flat. Bowel sounds are normal.     Palpations: Abdomen is soft.  Musculoskeletal:        General: Normal range of motion.     Cervical back: Normal range of motion and neck supple.  Skin:    General: Skin is warm and dry.     Capillary Refill: Capillary  refill takes less than 2 seconds.  Neurological:     Mental Status: He is alert.     Motor: Weakness present.     Gait: Gait abnormal.     Comments: Cogwheel rigity  Psychiatric:        Mood and Affect: Mood normal.        Behavior: Behavior normal.       Lab Results  Component Value Date   WBC 4.4 09/12/2019   HGB 15.6 09/12/2019   HCT 44.9 09/12/2019   PLT 172 09/12/2019   GLUCOSE 89 09/12/2019   CHOL 242 (H) 09/12/2019   TRIG 236 (H) 09/12/2019   HDL 32 (L) 09/12/2019   LDLDIRECT 99.5 05/09/2012   LDLCALC 166 (H) 09/12/2019   ALT 18 09/12/2019   AST 19 09/12/2019   NA 141 09/12/2019   K 4.4 09/12/2019   CL 105 09/12/2019   CREATININE 1.57 (H) 09/12/2019   BUN 27 09/12/2019   CO2 22 09/12/2019   TSH 0.668 06/05/2013   INR 1.05 06/05/2013   HGBA1C 5.4 08/20/2009      Assessment & Plan:  Diagnoses and all orders for this visit: Pneumonia of upper lobe due to infectious organism, unspecified laterality Patient was treated in the hosptial for CAP and did well on antibiotics.  Acute cystitis without hematuria Patient also has UTI on admission and treated with IV antibiotics and patient recouped well  Dementia with behavioral disturbance, unspecified dementia type ([place]) Patient's dementia was a problems with behavior and patient not understanding enviroment or actions of nurses.   Need for immunization against influenza -     Flu Vaccine QUAD High Dose(Fluad)            Follow-up: Return in about 2 months (around 04/24/2020).  An After Visit Summary was printed and given to the patient.  Reinaldo Meeker, MD [facility] [phone]

## 2020-02-24 ENCOUNTER — Encounter: Payer: Self-pay | Admitting: Legal Medicine

## 2020-03-13 ENCOUNTER — Ambulatory Visit: Payer: Medicare Other | Admitting: Legal Medicine

## 2020-03-19 ENCOUNTER — Encounter: Payer: Self-pay | Admitting: Legal Medicine

## 2020-03-19 ENCOUNTER — Other Ambulatory Visit: Payer: Self-pay

## 2020-03-19 ENCOUNTER — Ambulatory Visit (INDEPENDENT_AMBULATORY_CARE_PROVIDER_SITE_OTHER): Payer: Medicare Other | Admitting: Legal Medicine

## 2020-03-19 VITALS — BP 128/84 | HR 77 | Temp 97.3°F | Ht 71.0 in | Wt 143.0 lb

## 2020-03-19 DIAGNOSIS — F0391 Unspecified dementia with behavioral disturbance: Secondary | ICD-10-CM | POA: Diagnosis not present

## 2020-03-19 DIAGNOSIS — N183 Chronic kidney disease, stage 3 unspecified: Secondary | ICD-10-CM | POA: Diagnosis not present

## 2020-03-19 DIAGNOSIS — J449 Chronic obstructive pulmonary disease, unspecified: Secondary | ICD-10-CM | POA: Diagnosis not present

## 2020-03-19 DIAGNOSIS — R918 Other nonspecific abnormal finding of lung field: Secondary | ICD-10-CM

## 2020-03-19 DIAGNOSIS — I10 Essential (primary) hypertension: Secondary | ICD-10-CM

## 2020-03-19 DIAGNOSIS — I2510 Atherosclerotic heart disease of native coronary artery without angina pectoris: Secondary | ICD-10-CM

## 2020-03-19 DIAGNOSIS — M19041 Primary osteoarthritis, right hand: Secondary | ICD-10-CM | POA: Diagnosis not present

## 2020-03-19 DIAGNOSIS — E782 Mixed hyperlipidemia: Secondary | ICD-10-CM

## 2020-03-19 NOTE — Progress Notes (Signed)
Subjective:  Patient ID: [name], adult    DOB: 08-09-1928  Age: 86 y.o. MRN: [id number]  Chief Complaint  Patient presents with  . Hypertension    HPI: chronic visit  Patient presents for follow up of hypertension.  Patient tolerating altace well with side effects.  Patient was diagnosed with hypertension 2010 so has been treated for hypertension for 10 years.Patient is working on maintaining diet and exercise regimen and follows up as directed. Complication include none  Dementia started 8 years ago and is progressing.  He does not talk much, has full ADLs, problems with IADls, daughter cares for him.  CORONARY ARTERY DISEASE  Patient presents in follow up of CAD. Patient was diagnosed in 2010. The patient has no associated CHF. The patient is currently taking a beta blocker, statin, and aspirin. CAD was diagnosed 10 years ago.  Patient is having no angina. Patient has used no NTG.  Patient is followed by cardiology.  Patient had none . Last angiography was na, last echocardiogram unknown.   Current Outpatient Medications on File Prior to Visit  Medication Sig Dispense Refill  . acetaminophen (TYLENOL) 500 MG tablet Take 1,000 mg by mouth every 6 (six) hours as needed for mild pain, moderate pain, fever or headache.    . albuterol (PROVENTIL) (2.5 MG/3ML) 0.083% nebulizer solution USE 1 VIAL IN NEBULIZER 3 TIMES DAILY 90 mL 5  . Artificial Tear Ointment (DRY EYES OP) Place 2 drops into both eyes daily as needed (for dry eyes).    Marland Kitchen. aspirin EC 81 MG tablet Take 81 mg by mouth at bedtime.    . famotidine (PEPCID) 20 MG tablet Take 20 mg by mouth 2 (two) times daily.    . fenofibrate (TRICOR) 145 MG tablet TAKE ONE (1) TABLET ONCE DAILY 90 tablet 2  . finasteride (PROSCAR) 5 MG tablet TAKE ONE (1) TABLET ONCE DAILY 90 tablet 2  . fish oil-omega-3 fatty acids 1000 MG capsule Take 4 g by mouth 2 (two) times daily.    Marland Kitchen. guaiFENesin (MUCINEX) 600 MG 12 hr tablet Take 600 mg by mouth 2  (two) times daily.    Marland Kitchen. ipratropium (ATROVENT) 0.02 % nebulizer solution USE 1 VIAL IN NEBULIZER 3 TIMES DAILY 75 mL 11  . ramipril (ALTACE) 10 MG capsule TAKE ONE (1) CAPSULE EACH DAY 90 capsule 2   No current facility-administered medications on file prior to visit.   Past Medical History:  Diagnosis Date  . Arthritis   . Chronic kidney disease    kidney stone  . COPD (chronic obstructive pulmonary disease) (HCC)   . Dementia (HCC)   . Hyperlipidemia   . Leg weakness   . Postoperative atrial fibrillation (HCC)    history of   . Unspecified dementia with behavioral disturbance [facility](HCC)    Past Surgical History:  Procedure Laterality Date  . CARDIAC CATHETERIZATION  06/2005    EF 50%. severe native CAD. SVG-RCA 50-60 othewise all grafts patent  . CERVICAL SPINE SURGERY  Apr 09, 2009  . CORONARY ANGIOPLASTY WITH STENT PLACEMENT  11/1997   status post percutaneous coronary intervention and stenting with bare metal stent of the vein graft to the right coronary artery.  . CORONARY ARTERY BYPASS GRAFT  1988   x3in 1988 with left nternal mammary artery to left anterior descending, vein graft to the obtuse marginal, vein graft to the right coronary artery  . CYSTOSCOPY WITH LITHOLAPAXY N/A 11/29/2014   Procedure: CYSTOSCOPY WITH LITHOLAPAXY ;  Surgeon: Festus Aloe, MD;  Location: [facility];  Service: Urology;  Laterality: N/A;  . CYSTOSCOPY WITH RETROGRADE PYELOGRAM, URETEROSCOPY AND STENT PLACEMENT Right 11/29/2014   Procedure: RIGHT  RETROGRADE PYELOGRAM, URETEROSCOPY,  AND STENT PLACEMENT;  Surgeon: Festus Aloe, MD;  Location: [facility];  Service: Urology;  Laterality: Right;  . CYSTOSCOPY WITH URETEROSCOPY, STONE BASKETRY AND STENT PLACEMENT Right 12/24/2014   Procedure: CYSTOSCOPY WITH RIGHT URETEROSCOPY, WITH BASKETRY EXTRACTION AND STENT PLACEMENT/EXCHANGE;  Surgeon: Festus Aloe, MD;  Location: [facility];  Service: Urology;  Laterality: Right;  . EYE SURGERY     cataract surgery  .  HEMORRHOID BANDING  2003  . HOLMIUM LASER APPLICATION Right [id number]   Procedure: HOLMIUM LASER APPLICATION;  Surgeon: Festus Aloe, MD;  Location: [facility];  Service: Urology;  Laterality: Right;  . HOLMIUM LASER APPLICATION Right [id number]   Procedure: HOLMIUM LASER LITHROTRIPSY ;  Surgeon: Festus Aloe, MD;  Location: [facility];  Service: Urology;  Laterality: Right;  . LEFT HEART CATHETERIZATION WITH CORONARY/GRAFT ANGIOGRAM N/A 06/06/2013   Procedure: LEFT HEART CATHETERIZATION WITH Beatrix Fetters;  Surgeon: Blane Ohara, MD;  Location: [facility] CATH LAB;  Service: Cardiovascular;  Laterality: N/A;  . PROSTATE SURGERY  march 2009   for stone and prostate trimmed  . TRANSURETHRAL RESECTION OF PROSTATE N/A 11/29/2014   Procedure: CYSTO TRANSURETHRAL RESECTION OF THE PROSTATE (TURP);  Surgeon: Festus Aloe, MD;  Location: [facility];  Service: Urology;  Laterality: N/A;    Family History  Problem Relation Age of Onset  . Stroke Father   . Hyperlipidemia Father   . Hypertension Father    Social History   Socioeconomic History  . Marital status: Married    Spouse name: Not on file  . Number of children: Not on file  . Years of education: Not on file  . Highest education level: Not on file  Occupational History  . Not on file  Tobacco Use  . Smoking status: Former Smoker    Packs/day: 0.25    Years: 10.00    Pack years: 2.50    Types: Cigarettes    Quit date: 03/23/1963    Years since quitting: 57.0  . Smokeless tobacco: Former Systems developer  . Tobacco comment: Quit in 1965  Vaping Use  . Vaping Use: Never used  Substance and Sexual Activity  . Alcohol use: No  . Drug use: No  . Sexual activity: Not on file  Other Topics Concern  . Not on file  Social History Narrative  . Not on file   Social Determinants of Health   Financial Resource Strain: Not on file  Food Insecurity: Not on file  Transportation Needs: Not on file  Physical Activity: Not on file  Stress: Not on  file  Social Connections: Not on file    Review of Systems  Constitutional: Negative for activity change (less active), fatigue and fever.  HENT: Negative for congestion, hearing loss and sinus pain.   Eyes: Negative for visual disturbance.  Respiratory: Negative for cough, chest tightness and shortness of breath.   Cardiovascular: Negative for chest pain, palpitations and leg swelling.  Gastrointestinal: Negative for abdominal distention and abdominal pain.  Endocrine: Negative for polyuria.  Genitourinary: Negative for difficulty urinating, dyspareunia and urgency.  Neurological: Positive for weakness. Negative for tremors and numbness.  Psychiatric/Behavioral: Positive for confusion. Negative for agitation and behavioral problems.     Objective:  BP 128/84 (BP Location: Left Arm, Patient Position: Sitting, Cuff Size: Normal)   Pulse 77  Temp (!) 97.3 F (36.3 C) (Temporal)   Ht 5\' 11"  (1.803 m)   Wt 143 lb (64.9 kg)   SpO2 94%   BMI 19.94 kg/m   BP/Weight 03/19/2020 02/22/2020 [id number]  Systolic BP 0000000 [id number]  Diastolic BP 84 78 62  Wt. (Lbs) 143 143 162  BMI 19.94 19.94 22.59    Physical Exam Vitals reviewed.  Constitutional:      General: He is not in acute distress.    Appearance: Normal appearance. He is not toxic-appearing.  HENT:     Head: Normocephalic.     Right Ear: Tympanic membrane normal.     Left Ear: Tympanic membrane normal.     Mouth/Throat:     Mouth: Mucous membranes are moist.     Pharynx: Oropharynx is clear.  Eyes:     Extraocular Movements: Extraocular movements intact.     Conjunctiva/sclera: Conjunctivae normal.     Pupils: Pupils are equal, round, and reactive to light.  Cardiovascular:     Rate and Rhythm: Normal rate and regular rhythm.     Pulses: Normal pulses.     Heart sounds: Normal heart sounds. No murmur heard. No gallop.   Pulmonary:     Effort: Pulmonary effort is normal. No respiratory distress.     Breath sounds:  Normal breath sounds. No wheezing.  Abdominal:     General: Abdomen is flat. Bowel sounds are normal. There is no distension.     Palpations: Abdomen is soft.     Tenderness: There is no abdominal tenderness.  Musculoskeletal:        General: Normal range of motion.     Cervical back: Normal range of motion and neck supple.     Comments: oa right hand  Skin:    General: Skin is warm and dry.     Capillary Refill: Capillary refill takes less than 2 seconds.  Neurological:     General: No focal deficit present.     Mental Status: He is alert. He is disoriented.     Motor: Weakness present.     Gait: Gait abnormal (uses rolator).    MMSE 4    Lab Results  Component Value Date   WBC 4.4 09/12/2019   HGB 15.6 09/12/2019   HCT 44.9 09/12/2019   PLT 172 09/12/2019   GLUCOSE 89 09/12/2019   CHOL 242 (H) 09/12/2019   TRIG 236 (H) 09/12/2019   HDL 32 (L) 09/12/2019   LDLDIRECT 99.5 05/09/2012   LDLCALC 166 (H) 09/12/2019   ALT 18 09/12/2019   AST 19 09/12/2019   NA 141 09/12/2019   K 4.4 09/12/2019   CL 105 09/12/2019   CREATININE 1.57 (H) 09/12/2019   BUN 27 09/12/2019   CO2 22 09/12/2019   TSH 0.668 06/05/2013   INR 1.05 06/05/2013   HGBA1C 5.4 08/20/2009      Assessment & Plan:   1. Essential hypertension - Comprehensive metabolic panel - CBC with Differential/Platelet An individual hypertension care plan was established and reinforced today.  The patient's status was assessed using clinical findings on exam and labs or diagnostic tests. The patient's success at meeting treatment goals on disease specific evidence-based guidelines and found to be well controlled. SELF MANAGEMENT: The patient and I together assessed ways to personally work towards obtaining the recommended goals. RECOMMENDATIONS: avoid decongestants found in common cold remedies, decrease consumption of alcohol, perform routine monitoring of BP with home BP cuff, exercise, reduction of dietary salt,  take medicines  as prescribed, try not to miss doses and quit smoking.  Regular exercise and maintaining a healthy weight is needed.  Stress reduction may help. A CLINICAL SUMMARY including written plan identify barriers to care unique to individual due to social or financial issues.  We attempt to mutually creat solutions for individual and family understanding.  2. Mixed hyperlipidemia - Lipid panel - TSH AN INDIVIDUAL CARE PLAN for hyperlipidemia/ cholesterol was established and reinforced today.  The patient's status was assessed using clinical findings on exam, lab and other diagnostic tests. The patient's disease status was assessed based on evidence-based guidelines and found to be well controlled. MEDICATIONS were reviewed. SELF MANAGEMENT GOALS have been discussed and patient's success at attaining the goal of low cholesterol was assessed. RECOMMENDATION given include regular exercise 3 days a week and low cholesterol/low fat diet. CLINICAL SUMMARY including written plan to identify barriers unique to the patient due to social or economic  reasons was discussed.  3. Chronic obstructive pulmonary disease, unspecified COPD type (HCC) An individualize plan was formulated for care of COPD.  Treatment is evidence based.  She will continue on inhalers, avoid smoking and smoke.  Regular exercise with help with dyspnea. Routine follow ups and medication compliance is needed.  4. Stage 3 chronic kidney disease, unspecified whether stage 3a or 3b CKD (HCC) AN INDIVIDUAL CARE PLAN for renal disease was established and reinforced today.  The patient's status was assessed using clinical findings on exam, labs, and other diagnostic testing. Patient's success at meeting treatment goals based on disease specific evidence-bassed guidelines and found to be in fair control. RECOMMENDATIONS include maintaining present medicines and treatment.  5. Coronary artery disease involving native coronary artery of  native heart without angina pectoris Patient's CAD was assessed using history and physical along with other information to maximize treatment.  Evidence based criteria was use in deciding proper management for this disease process.  Patient's CAD is under good control.therapy continue present medicines.  6. Arthritis of right hand Patient has longstanding osteoarthritis in right hand, relieved by tylenol, he remains with good grip  7. Abnormal findings on diagnostic imaging of lung - CT Chest W Contrast We se up follow CT for he lung mass found in past  8. Dementia with behavioral disturbance, unspecified dementia type [facility]) Patient has vascular dementia and recent MMSE is 4      Orders Placed This Encounter  Procedures  . CT Chest W Contrast  . Comprehensive metabolic panel  . Lipid panel  . CBC with Differential/Platelet  . TSH      I spent 25 minutes dedicated to the care of this patient on the date of this encounter to include face-to-face time with the patient, as well as: Evaluation of his mental status and review of old CT that demonstrated a lung mass.  We scheduled him new CT with contrast for February.  Reviewed his immunizations and his functional status.  Follow-up: Return in about 3 months (around 06/17/2020) for fasting.  An After Visit Summary was printed and given to the patient.  Brent Bulla, MD [facility] [phone]

## 2020-03-20 ENCOUNTER — Other Ambulatory Visit: Payer: Self-pay | Admitting: Legal Medicine

## 2020-03-20 LAB — CBC WITH DIFFERENTIAL/PLATELET
Basophils Absolute: 0 10*3/uL (ref 0.0–0.2)
Basos: 0 %
EOS (ABSOLUTE): 0.2 10*3/uL (ref 0.0–0.4)
Eos: 2 %
Hematocrit: 43.9 % (ref 37.5–51.0)
Hemoglobin: 15 g/dL (ref 13.0–17.7)
Immature Grans (Abs): 0 10*3/uL (ref 0.0–0.1)
Immature Granulocytes: 1 %
Lymphocytes Absolute: 0.8 10*3/uL (ref 0.7–3.1)
Lymphs: 11 %
MCH: 32 pg (ref 26.6–33.0)
MCHC: 34.2 g/dL (ref 31.5–35.7)
MCV: 94 fL (ref 79–97)
Monocytes Absolute: 0.4 10*3/uL (ref 0.1–0.9)
Monocytes: 6 %
Neutrophils Absolute: 5.6 10*3/uL (ref 1.4–7.0)
Neutrophils: 80 %
Platelets: 192 10*3/uL (ref 150–450)
RBC: 4.69 x10E6/uL (ref 4.14–5.80)
RDW: 13.3 % (ref 11.6–15.4)
WBC: 7 10*3/uL (ref 3.4–10.8)

## 2020-03-20 LAB — TSH: TSH: 2.38 u[IU]/mL (ref 0.450–4.500)

## 2020-03-20 LAB — COMPREHENSIVE METABOLIC PANEL
ALT: 18 IU/L (ref 0–44)
AST: 20 IU/L (ref 0–40)
Albumin/Globulin Ratio: 2.2 (ref 1.2–2.2)
Albumin: 4.4 g/dL (ref 3.6–4.6)
Alkaline Phosphatase: 40 IU/L — ABNORMAL LOW (ref 44–121)
BUN/Creatinine Ratio: 15 (ref 10–24)
BUN: 22 mg/dL (ref 8–27)
Bilirubin Total: 0.4 mg/dL (ref 0.0–1.2)
CO2: 21 mmol/L (ref 20–29)
Calcium: 9.9 mg/dL (ref 8.6–10.2)
Chloride: 104 mmol/L (ref 96–106)
Creatinine, Ser: 1.47 mg/dL — ABNORMAL HIGH (ref 0.76–1.27)
GFR calc Af Amer: 48 mL/min/{1.73_m2} — ABNORMAL LOW (ref >59)
GFR calc non Af Amer: 42 mL/min/{1.73_m2} — ABNORMAL LOW (ref >59)
Globulin, Total: 2 g/dL (ref 1.5–4.5)
Glucose: 89 mg/dL (ref 65–99)
Potassium: 4.1 mmol/L (ref 3.5–5.2)
Sodium: 140 mmol/L (ref 134–144)
Total Protein: 6.4 g/dL (ref 6.0–8.5)

## 2020-03-20 LAB — LIPID PANEL
Chol/HDL Ratio: 6 ratio — ABNORMAL HIGH (ref 0.0–5.0)
Cholesterol, Total: 240 mg/dL — ABNORMAL HIGH (ref 100–199)
HDL: 40 mg/dL (ref >39)
LDL Chol Calc (NIH): 176 mg/dL — ABNORMAL HIGH (ref 0–99)
Triglycerides: 131 mg/dL (ref 0–149)
VLDL Cholesterol Cal: 24 mg/dL (ref 5–40)

## 2020-03-20 LAB — CARDIOVASCULAR RISK ASSESSMENT

## 2020-03-20 NOTE — Progress Notes (Signed)
Kidney tests stable, liver tests normal, ldl cholesterol 176 high, can he tolerate statins?, cbc normal, TSH 2.380 lp

## 2020-04-15 ENCOUNTER — Other Ambulatory Visit: Payer: Self-pay | Admitting: Legal Medicine

## 2020-04-15 DIAGNOSIS — J449 Chronic obstructive pulmonary disease, unspecified: Secondary | ICD-10-CM

## 2020-04-22 ENCOUNTER — Other Ambulatory Visit: Payer: Self-pay | Admitting: Legal Medicine

## 2020-04-22 DIAGNOSIS — R918 Other nonspecific abnormal finding of lung field: Secondary | ICD-10-CM

## 2020-04-24 DIAGNOSIS — J449 Chronic obstructive pulmonary disease, unspecified: Secondary | ICD-10-CM | POA: Diagnosis not present

## 2020-04-24 DIAGNOSIS — K449 Diaphragmatic hernia without obstruction or gangrene: Secondary | ICD-10-CM | POA: Diagnosis not present

## 2020-04-24 DIAGNOSIS — Z8701 Personal history of pneumonia (recurrent): Secondary | ICD-10-CM | POA: Diagnosis not present

## 2020-04-24 DIAGNOSIS — I700 Atherosclerosis of aorta: Secondary | ICD-10-CM | POA: Diagnosis not present

## 2020-04-24 DIAGNOSIS — R918 Other nonspecific abnormal finding of lung field: Secondary | ICD-10-CM | POA: Diagnosis not present

## 2020-05-27 ENCOUNTER — Other Ambulatory Visit: Payer: Self-pay | Admitting: Legal Medicine

## 2020-06-03 ENCOUNTER — Other Ambulatory Visit: Payer: Self-pay

## 2020-06-03 MED ORDER — RAMIPRIL 10 MG PO CAPS: 10.0000 mg | ORAL_CAPSULE | Freq: Every day | ORAL | 2 refills | Status: DC

## 2020-06-18 ENCOUNTER — Ambulatory Visit: Payer: Medicare Other | Admitting: Legal Medicine

## 2020-07-01 ENCOUNTER — Encounter: Payer: Self-pay | Admitting: Legal Medicine

## 2020-07-01 ENCOUNTER — Ambulatory Visit (INDEPENDENT_AMBULATORY_CARE_PROVIDER_SITE_OTHER): Payer: Medicare Other | Admitting: Legal Medicine

## 2020-07-01 ENCOUNTER — Other Ambulatory Visit: Payer: Self-pay

## 2020-07-01 VITALS — BP 110/64 | HR 76 | Temp 97.2°F | Resp 16 | Ht 71.0 in | Wt 150.0 lb

## 2020-07-01 DIAGNOSIS — I2510 Atherosclerotic heart disease of native coronary artery without angina pectoris: Secondary | ICD-10-CM

## 2020-07-01 DIAGNOSIS — I10 Essential (primary) hypertension: Secondary | ICD-10-CM | POA: Diagnosis not present

## 2020-07-01 DIAGNOSIS — J449 Chronic obstructive pulmonary disease, unspecified: Secondary | ICD-10-CM | POA: Diagnosis not present

## 2020-07-01 DIAGNOSIS — E782 Mixed hyperlipidemia: Secondary | ICD-10-CM

## 2020-07-01 DIAGNOSIS — N400 Enlarged prostate without lower urinary tract symptoms: Secondary | ICD-10-CM

## 2020-07-01 DIAGNOSIS — G301 Alzheimer's disease with late onset: Secondary | ICD-10-CM | POA: Diagnosis not present

## 2020-07-01 DIAGNOSIS — F0281 Dementia in other diseases classified elsewhere with behavioral disturbance: Secondary | ICD-10-CM | POA: Diagnosis not present

## 2020-07-01 DIAGNOSIS — E440 Moderate protein-calorie malnutrition: Secondary | ICD-10-CM | POA: Diagnosis not present

## 2020-07-01 NOTE — Progress Notes (Signed)
Subjective:  Patient ID: [name], adult    DOB: March 04, 1929  Age: 87 y.o. MRN: [id number]  Chief Complaint  Patient presents with  . Hypertension  . Dementia    HPI: chronic visit  Patient presents for follow up of hypertension.  Patient tolerating altace well with side effects.  Patient was diagnosed with hypertension 2010 so has been treated for hypertension for 10 years.Patient is working on maintaining diet and exercise regimen and follows up as directed. Complication include none  CORONARY ARTERY DISEASE  Patient presents in follow up of CAD. Patient was diagnosed in 54. The patient has no associated CHF. The patient is currently taking a beta blocker, statin, and aspirin. CAD was diagnosed 30 years ago.  Patient is having no angina. Patient has used no NTG.  Patient is followed by cardiology.  Patient had CABG and stenting . Last angionagraphy was na, last echocardiogram 2015.Marland Kitchen  Dementia has progress and his is staying awake at night.   Current Outpatient Medications on File Prior to Visit  Medication Sig Dispense Refill  . acetaminophen (TYLENOL) 500 MG tablet Take 1,000 mg by mouth every 6 (six) hours as needed for mild pain, moderate pain, fever or headache.    . albuterol (PROVENTIL) (2.5 MG/3ML) 0.083% nebulizer solution USE 1 VIAL IN NEBULIZER 3 TIMES DAILY 90 mL 11  . Artificial Tear Ointment (DRY EYES OP) Place 2 drops into both eyes daily as needed (for dry eyes).    Marland Kitchen aspirin EC 81 MG tablet Take 81 mg by mouth at bedtime.    . famotidine (PEPCID) 20 MG tablet Take 20 mg by mouth 2 (two) times daily.    . fenofibrate (TRICOR) 145 MG tablet TAKE ONE (1) TABLET ONCE DAILY 90 tablet 2  . finasteride (PROSCAR) 5 MG tablet TAKE ONE (1) TABLET ONCE DAILY 90 tablet 2  . fish oil-omega-3 fatty acids 1000 MG capsule Take 4 g by mouth 2 (two) times daily.    Marland Kitchen guaiFENesin (MUCINEX) 600 MG 12 hr tablet Take 600 mg by mouth 2 (two) times daily.    Marland Kitchen ipratropium (ATROVENT)  0.02 % nebulizer solution USE 1 VIAL IN NEBULIZER 3 TIMES DAILY 90 mL 11  . ramipril (ALTACE) 10 MG capsule Take 1 capsule (10 mg total) by mouth daily. 90 capsule 2   No current facility-administered medications on file prior to visit.   Past Medical History:  Diagnosis Date  . Arthritis   . Chronic kidney disease    kidney stone  . COPD (chronic obstructive pulmonary disease) ([place])   . Dementia ([place])   . Hyperlipidemia   . Leg weakness   . Postoperative atrial fibrillation (HCC)    history of   . Unspecified dementia with behavioral disturbance [facility])    Past Surgical History:  Procedure Laterality Date  . CARDIAC CATHETERIZATION  06/2005    EF 50%. severe native CAD. SVG-RCA 50-60 othewise all grafts patent  . CERVICAL SPINE SURGERY  Apr 09, 2009  . CORONARY ANGIOPLASTY WITH STENT PLACEMENT  11/1997   status post percutaneous coronary intervention and stenting with bare metal stent of the vein graft to the right coronary artery.  . CORONARY ARTERY BYPASS GRAFT  1988   [place] with left nternal mammary artery to left anterior descending, vein graft to the obtuse marginal, vein graft to the right coronary artery  . CYSTOSCOPY WITH LITHOLAPAXY N/A 11/29/2014   Procedure: CYSTOSCOPY WITH LITHOLAPAXY ;  Surgeon: Festus Aloe, MD;  Location:  [facility];  Service: Urology;  Laterality: N/A;  . CYSTOSCOPY WITH RETROGRADE PYELOGRAM, URETEROSCOPY AND STENT PLACEMENT Right 11/29/2014   Procedure: RIGHT  RETROGRADE PYELOGRAM, URETEROSCOPY,  AND STENT PLACEMENT;  Surgeon: Festus Aloe, MD;  Location: [facility];  Service: Urology;  Laterality: Right;  . CYSTOSCOPY WITH URETEROSCOPY, STONE BASKETRY AND STENT PLACEMENT Right 12/24/2014   Procedure: CYSTOSCOPY WITH RIGHT URETEROSCOPY, WITH BASKETRY EXTRACTION AND STENT PLACEMENT/EXCHANGE;  Surgeon: Festus Aloe, MD;  Location: [facility];  Service: Urology;  Laterality: Right;  . EYE SURGERY     cataract surgery  . HEMORRHOID BANDING  2003  .  HOLMIUM LASER APPLICATION Right 04/27/2033   Procedure: HOLMIUM LASER APPLICATION;  Surgeon: Festus Aloe, MD;  Location: [facility];  Service: Urology;  Laterality: Right;  . HOLMIUM LASER APPLICATION Right 59/09/4161   Procedure: HOLMIUM LASER LITHROTRIPSY ;  Surgeon: Festus Aloe, MD;  Location: [facility];  Service: Urology;  Laterality: Right;  . LEFT HEART CATHETERIZATION WITH CORONARY/GRAFT ANGIOGRAM N/A 06/06/2013   Procedure: LEFT HEART CATHETERIZATION WITH Beatrix Fetters;  Surgeon: Blane Ohara, MD;  Location: [facility] CATH LAB;  Service: Cardiovascular;  Laterality: N/A;  . PROSTATE SURGERY  march 2009   for stone and prostate trimmed  . TRANSURETHRAL RESECTION OF PROSTATE N/A 11/29/2014   Procedure: CYSTO TRANSURETHRAL RESECTION OF THE PROSTATE (TURP);  Surgeon: Festus Aloe, MD;  Location: [facility];  Service: Urology;  Laterality: N/A;    Family History  Problem Relation Age of Onset  . Stroke Father   . Hyperlipidemia Father   . Hypertension Father    Social History   Socioeconomic History  . Marital status: Married    Spouse name: Not on file  . Number of children: Not on file  . Years of education: Not on file  . Highest education level: Not on file  Occupational History  . Not on file  Tobacco Use  . Smoking status: Former Smoker    Packs/day: 0.25    Years: 10.00    Pack years: 2.50    Types: Cigarettes    Quit date: 03/23/1963    Years since quitting: 57.3  . Smokeless tobacco: Former Systems developer  . Tobacco comment: Quit in 1965  Vaping Use  . Vaping Use: Never used  Substance and Sexual Activity  . Alcohol use: No  . Drug use: No  . Sexual activity: Not on file  Other Topics Concern  . Not on file  Social History Narrative  . Not on file   Social Determinants of Health   Financial Resource Strain: Not on file  Food Insecurity: Not on file  Transportation Needs: Not on file  Physical Activity: Not on file  Stress: Not on file  Social Connections:  Not on file    Review of Systems  Constitutional: Negative for activity change and appetite change.  HENT: Negative.   Eyes: Negative for visual disturbance.  Respiratory: Negative for chest tightness and shortness of breath.   Cardiovascular: Negative for chest pain, palpitations and leg swelling.  Gastrointestinal: Negative for abdominal distention and abdominal pain.  Endocrine: Negative for polyuria.  Genitourinary: Negative for difficulty urinating and dysuria.  Musculoskeletal: Negative for arthralgias and back pain.  Neurological: Positive for weakness.  Psychiatric/Behavioral: Positive for behavioral problems and confusion.     Objective:  BP 110/64   Pulse 76   Temp (!) 97.2 F (36.2 C)   Resp 16   Ht 5\' 11"  (1.803 m)   Wt 150 lb (68 kg)  BMI 20.92 kg/m   BP/Weight 07/01/2020 03/19/2020 47/0/9628  Systolic BP 366 294 765  Diastolic BP 64 84 78  Wt. (Lbs) 150 143 143  BMI 20.92 19.94 19.94    Physical Exam Vitals reviewed.  Constitutional:      Appearance: Normal appearance.     Comments: Patient using rollator  HENT:     Head: Normocephalic and atraumatic.     Right Ear: Tympanic membrane, ear canal and external ear normal.     Left Ear: Tympanic membrane, ear canal and external ear normal.     Mouth/Throat:     Mouth: Mucous membranes are moist.     Pharynx: Oropharynx is clear.  Eyes:     Extraocular Movements: Extraocular movements intact.     Conjunctiva/sclera: Conjunctivae normal.     Pupils: Pupils are equal, round, and reactive to light.  Cardiovascular:     Rate and Rhythm: Normal rate and regular rhythm.     Pulses: Normal pulses.     Heart sounds: Normal heart sounds. No murmur heard. No gallop.   Pulmonary:     Effort: No respiratory distress.     Breath sounds: Normal breath sounds. No rales.  Abdominal:     General: Abdomen is flat. Bowel sounds are normal. There is no distension.     Palpations: Abdomen is soft.     Tenderness:  There is no abdominal tenderness.  Musculoskeletal:        General: Normal range of motion.     Cervical back: Normal range of motion and neck supple.  Skin:    General: Skin is warm and dry.     Capillary Refill: Capillary refill takes less than 2 seconds.  Neurological:     General: No focal deficit present.     Mental Status: He is alert and oriented to person, place, and time.     Motor: Weakness present.     Gait: Gait abnormal.       Lab Results  Component Value Date   WBC 7.0 03/19/2020   HGB 15.0 03/19/2020   HCT 43.9 03/19/2020   PLT 192 03/19/2020   GLUCOSE 89 03/19/2020   CHOL 240 (H) 03/19/2020   TRIG 131 03/19/2020   HDL 40 03/19/2020   LDLDIRECT 99.5 05/09/2012   LDLCALC 176 (H) 03/19/2020   ALT 18 03/19/2020   AST 20 03/19/2020   NA 140 03/19/2020   K 4.1 03/19/2020   CL 104 03/19/2020   CREATININE 1.47 (H) 03/19/2020   BUN 22 03/19/2020   CO2 21 03/19/2020   TSH 2.380 03/19/2020   INR 1.05 06/05/2013   HGBA1C 5.4 08/20/2009      Assessment & Plan:   Diagnoses and all orders for this visit: Coronary artery disease involving native coronary artery of native heart without angina pectoris Patient's CAD was assessed using history and physical along with other information to maximize treatment.  Evidence based criteria was use in deciding proper management for this disease process.  Patient's CAD is under good control.therapy continue present treatment . Malnutrition of moderate degree (HCC) Supplement nutrition with protein/calorie supplement with meals to improve nutritional status.  Chronic obstructive pulmonary disease, unspecified COPD type ([place]) An individualize plan was formulated for care of COPD.  Treatment is evidence based.  She will continue on inhalers, avoid smoking and smoke.  Regular exercise with help with dyspnea. Routine follow ups and medication compliance is needed.  Essential hypertension -     CBC with Differential/Platelet -  Comprehensive metabolic panel An individual hypertension care plan was established and reinforced today.  The patient's status was assessed using clinical findings on exam and labs or diagnostic tests. The patient's success at meeting treatment goals on disease specific evidence-based guidelines and found to be fair controlled. SELF MANAGEMENT: The patient and I together assessed ways to personally work towards obtaining the recommended goals. RECOMMENDATIONS: avoid decongestants found in common cold remedies, decrease consumption of alcohol, perform routine monitoring of BP with home BP cuff, exercise, reduction of dietary salt, take medicines as prescribed, try not to miss doses and quit smoking.  Regular exercise and maintaining a healthy weight is needed.  Stress reduction may help. A CLINICAL SUMMARY including written plan identify barriers to care unique to individual due to social or financial issues.  We attempt to mutually creat solutions for individual and family understanding.  Late onset Alzheimer's dementia with behavioral disturbance ([place]) Dementia is getting worse with more sundowning and confusion.  No incontinence though,  Mixed hyperlipidemia -     Lipid panel AN INDIVIDUAL CARE PLAN for hyperlipidemia/ cholesterol was established and reinforced today.  The patient's status was assessed using clinical findings on exam, lab and other diagnostic tests. The patient's disease status was assessed based on evidence-based guidelines and found to be fair controlled. MEDICATIONS were reviewed. SELF MANAGEMENT GOALS have been discussed and patient's success at attaining the goal of low cholesterol was assessed. RECOMMENDATION given include regular exercise 3 days a week and low cholesterol/low fat diet. CLINICAL SUMMARY including written plan to identify barriers unique to the patient due to social or economic  reasons was discussed.  Benign prostatic hyperplasia without lower urinary  tract symptoms -     PSA Patient has BPH but is stable    30 minute vision, review of old records     Follow-up: Return in about 6 months (around 12/31/2020).  An After Visit Summary was printed and given to the patient.  Reinaldo Meeker, MD [facility] [phone]

## 2020-07-02 ENCOUNTER — Encounter: Payer: Self-pay | Admitting: Legal Medicine

## 2020-07-02 DIAGNOSIS — M791 Myalgia, unspecified site: Secondary | ICD-10-CM

## 2020-07-02 DIAGNOSIS — T466X5A Adverse effect of antihyperlipidemic and antiarteriosclerotic drugs, initial encounter: Secondary | ICD-10-CM | POA: Insufficient documentation

## 2020-07-02 HISTORY — DX: Adverse effect of antihyperlipidemic and antiarteriosclerotic drugs, initial encounter: T46.6X5A

## 2020-07-02 HISTORY — DX: Myalgia, unspecified site: M79.10

## 2020-07-02 LAB — COMPREHENSIVE METABOLIC PANEL
ALT: 15 IU/L (ref 0–44)
AST: 21 IU/L (ref 0–40)
Albumin/Globulin Ratio: 2 (ref 1.2–2.2)
Albumin: 4.5 g/dL (ref 3.6–4.6)
Alkaline Phosphatase: 40 IU/L — ABNORMAL LOW (ref 44–121)
BUN/Creatinine Ratio: 16 (ref 10–24)
BUN: 26 mg/dL (ref 8–27)
Bilirubin Total: 0.7 mg/dL (ref 0.0–1.2)
CO2: 19 mmol/L — ABNORMAL LOW (ref 20–29)
Calcium: 9.6 mg/dL (ref 8.6–10.2)
Chloride: 101 mmol/L (ref 96–106)
Creatinine, Ser: 1.63 mg/dL — ABNORMAL HIGH (ref 0.76–1.27)
Globulin, Total: 2.3 g/dL (ref 1.5–4.5)
Glucose: 88 mg/dL (ref 65–99)
Potassium: 3.9 mmol/L (ref 3.5–5.2)
Sodium: 142 mmol/L (ref 134–144)
Total Protein: 6.8 g/dL (ref 6.0–8.5)
eGFR: 40 mL/min/{1.73_m2} — ABNORMAL LOW (ref >59)

## 2020-07-02 LAB — LIPID PANEL
Chol/HDL Ratio: 7.1 ratio — ABNORMAL HIGH (ref 0.0–5.0)
Cholesterol, Total: 227 mg/dL — ABNORMAL HIGH (ref 100–199)
HDL: 32 mg/dL — ABNORMAL LOW (ref >39)
LDL Chol Calc (NIH): 161 mg/dL — ABNORMAL HIGH (ref 0–99)
Triglycerides: 181 mg/dL — ABNORMAL HIGH (ref 0–149)
VLDL Cholesterol Cal: 34 mg/dL (ref 5–40)

## 2020-07-02 LAB — CBC WITH DIFFERENTIAL/PLATELET
Basophils Absolute: 0 10*3/uL (ref 0.0–0.2)
Basos: 1 %
EOS (ABSOLUTE): 0.2 10*3/uL (ref 0.0–0.4)
Eos: 3 %
Hematocrit: 48.4 % (ref 37.5–51.0)
Hemoglobin: 16.1 g/dL (ref 13.0–17.7)
Immature Grans (Abs): 0 10*3/uL (ref 0.0–0.1)
Immature Granulocytes: 0 %
Lymphocytes Absolute: 1.1 10*3/uL (ref 0.7–3.1)
Lymphs: 22 %
MCH: 31.3 pg (ref 26.6–33.0)
MCHC: 33.3 g/dL (ref 31.5–35.7)
MCV: 94 fL (ref 79–97)
Monocytes Absolute: 0.4 10*3/uL (ref 0.1–0.9)
Monocytes: 8 %
Neutrophils Absolute: 3.4 10*3/uL (ref 1.4–7.0)
Neutrophils: 66 %
Platelets: 199 10*3/uL (ref 150–450)
RBC: 5.14 x10E6/uL (ref 4.14–5.80)
RDW: 13.1 % (ref 11.6–15.4)
WBC: 5.1 10*3/uL (ref 3.4–10.8)

## 2020-07-02 LAB — CARDIOVASCULAR RISK ASSESSMENT

## 2020-07-02 LAB — PSA: Prostate Specific Ag, Serum: 1.5 ng/mL (ref 0.0–4.0)

## 2020-07-02 NOTE — Progress Notes (Signed)
CBC normal, stage 3b kidney, liver tests normal, ldl cholesterol high 161, PSA 1.5 lp

## 2020-09-29 ENCOUNTER — Telehealth: Payer: Self-pay | Admitting: Legal Medicine

## 2020-09-29 NOTE — Chronic Care Management (AMB) (Signed)
  Chronic Care Management   Note  09/29/2020 Name: DONEVIN SAINSBURY MRN: [id number] DOB: 10-Apr-1928  KHALIB FENDLEY is a 87 y.o. year old adult who is a primary care patient of [name], MD. I reached out to Pamala Hurry by phone today in response to a referral sent by Mr. Devlin Brink Kolodziej's PCP, [name], MD.   Mr. Eggebrecht was given information about Chronic Care Management services today including:  CCM service includes personalized support from designated clinical staff supervised by his physician, including individualized plan of care and coordination with other care providers 24/7 contact phone numbers for assistance for urgent and routine care needs. Service will only be billed when office clinical staff spend 20 minutes or more in a month to coordinate care. Only one practitioner may furnish and bill the service in a calendar month. The patient may stop CCM services at any time (effective at the end of the month) by phone call to the office staff.   Patient wishes to consider information provided and/or speak with a member of the care team before deciding about enrollment in care management services.   Follow up plan:   Tatjana Secretary/administrator

## 2020-10-02 ENCOUNTER — Telehealth: Payer: Self-pay | Admitting: Legal Medicine

## 2020-10-02 ENCOUNTER — Encounter: Payer: Self-pay | Admitting: Legal Medicine

## 2020-10-02 ENCOUNTER — Other Ambulatory Visit: Payer: Self-pay

## 2020-10-02 ENCOUNTER — Ambulatory Visit (INDEPENDENT_AMBULATORY_CARE_PROVIDER_SITE_OTHER): Payer: Medicare Other | Admitting: Legal Medicine

## 2020-10-02 VITALS — BP 110/62 | HR 60 | Temp 97.4°F | Ht 71.0 in | Wt 128.0 lb

## 2020-10-02 DIAGNOSIS — J449 Chronic obstructive pulmonary disease, unspecified: Secondary | ICD-10-CM | POA: Diagnosis not present

## 2020-10-02 DIAGNOSIS — I10 Essential (primary) hypertension: Secondary | ICD-10-CM | POA: Diagnosis not present

## 2020-10-02 DIAGNOSIS — F0281 Dementia in other diseases classified elsewhere with behavioral disturbance: Secondary | ICD-10-CM | POA: Diagnosis not present

## 2020-10-02 DIAGNOSIS — I2510 Atherosclerotic heart disease of native coronary artery without angina pectoris: Secondary | ICD-10-CM

## 2020-10-02 DIAGNOSIS — G301 Alzheimer's disease with late onset: Secondary | ICD-10-CM | POA: Diagnosis not present

## 2020-10-02 DIAGNOSIS — T466X5A Adverse effect of antihyperlipidemic and antiarteriosclerotic drugs, initial encounter: Secondary | ICD-10-CM

## 2020-10-02 DIAGNOSIS — F02818 Dementia in other diseases classified elsewhere, unspecified severity, with other behavioral disturbance: Secondary | ICD-10-CM

## 2020-10-02 DIAGNOSIS — E782 Mixed hyperlipidemia: Secondary | ICD-10-CM | POA: Diagnosis not present

## 2020-10-02 DIAGNOSIS — M791 Myalgia, unspecified site: Secondary | ICD-10-CM | POA: Diagnosis not present

## 2020-10-02 DIAGNOSIS — N183 Chronic kidney disease, stage 3 unspecified: Secondary | ICD-10-CM | POA: Diagnosis not present

## 2020-10-02 DIAGNOSIS — Z681 Body mass index (BMI) 19 or less, adult: Secondary | ICD-10-CM | POA: Diagnosis not present

## 2020-10-02 DIAGNOSIS — E440 Moderate protein-calorie malnutrition: Secondary | ICD-10-CM

## 2020-10-02 NOTE — Chronic Care Management (AMB) (Signed)
  Chronic Care Management   Note  10/02/2020 Name: [name] MRN: [id number] DOB: 07/29/1928  [name] is a 87 y.o. year old adult who is a primary care patient of Brian Anes, MD. I reached out to [name] by phone today in response to a referral sent by [name]'s PCP, Brian Anes, MD.   [name] was given information about Chronic Care Management services today including:  CCM service includes personalized support from designated clinical staff supervised by his physician, including individualized plan of care and coordination with other care providers 24/7 contact phone numbers for assistance for urgent and routine care needs. Service will only be billed when office clinical staff spend 20 minutes or more in a month to coordinate care. Only one practitioner may furnish and bill the service in a calendar month. The patient may stop CCM services at any time (effective at the end of the month) by phone call to the office staff.   Patient agreed to services and verbal consent obtained.   Follow up plan:   [name]

## 2020-10-02 NOTE — Progress Notes (Addendum)
Established Patient Office Visit  Subjective:  Patient ID: [name], adult    DOB: [birth year]  Age: 87 y.o. MRN: [id number]  CC:  Chief Complaint  Patient presents with   Hyperlipidemia   Hypertension   Chronic Kidney Disease    HPI [name] presents for chronic visit  Patient presents for follow up of hypertension.  Patient tolerating rmipril well with side effects.  Patient was diagnosed with hypertension 2010 so has been treated for hypertension for 10 years.Patient is working on maintaining diet and exercise regimen and follows up as directed. Complication include cad.   Patient presents with hyperlipidemia.  Compliance with treatment has been good; patient takes medicines as directed, maintains low cholesterol diet, follows up as directed, and maintains exercise regimen.  Patient is using fish oil without problems.   Dementia with sundowning, no changes in behavior  Past Medical History:  Diagnosis Date   Arthritis    Chronic kidney disease    kidney stone   COPD (chronic obstructive pulmonary disease) (HCC)    Dementia (HCC)    Hyperlipidemia    Leg weakness    Postoperative atrial fibrillation (HCC)    history of    Unspecified dementia with behavioral disturbance [facility])     Past Surgical History:  Procedure Laterality Date   CARDIAC CATHETERIZATION  06/2005    EF 50%. severe native CAD. SVG-RCA 50-60 othewise all grafts patent   CERVICAL SPINE SURGERY  Apr 09, 2009   CORONARY ANGIOPLASTY WITH STENT PLACEMENT  11/1997   status post percutaneous coronary intervention and stenting with bare metal stent of the vein graft to the right coronary artery.   CORONARY ARTERY BYPASS GRAFT  1988   [place] with left nternal mammary artery to left anterior descending, vein graft to the obtuse marginal, vein graft to the right coronary artery   CYSTOSCOPY WITH LITHOLAPAXY N/A 11/29/2014   Procedure: CYSTOSCOPY WITH LITHOLAPAXY ;  Surgeon: Festus Aloe, MD;   Location: [facility];  Service: Urology;  Laterality: N/A;   CYSTOSCOPY WITH RETROGRADE PYELOGRAM, URETEROSCOPY AND STENT PLACEMENT Right 11/29/2014   Procedure: RIGHT  RETROGRADE PYELOGRAM, URETEROSCOPY,  AND STENT PLACEMENT;  Surgeon: Festus Aloe, MD;  Location: [facility];  Service: Urology;  Laterality: Right;   CYSTOSCOPY WITH URETEROSCOPY, STONE BASKETRY AND STENT PLACEMENT Right 12/24/2014   Procedure: CYSTOSCOPY WITH RIGHT URETEROSCOPY, WITH BASKETRY EXTRACTION AND STENT PLACEMENT/EXCHANGE;  Surgeon: Festus Aloe, MD;  Location: [facility];  Service: Urology;  Laterality: Right;   EYE SURGERY     cataract surgery   HEMORRHOID BANDING  2003   HOLMIUM LASER APPLICATION Right 07/25/8125   Procedure: HOLMIUM LASER APPLICATION;  Surgeon: Festus Aloe, MD;  Location: [facility];  Service: Urology;  Laterality: Right;   HOLMIUM LASER APPLICATION Right 51/09/15   Procedure: HOLMIUM LASER LITHROTRIPSY ;  Surgeon: Festus Aloe, MD;  Location: [facility];  Service: Urology;  Laterality: Right;   LEFT HEART CATHETERIZATION WITH CORONARY/GRAFT ANGIOGRAM N/A 06/06/2013   Procedure: LEFT HEART CATHETERIZATION WITH Beatrix Fetters;  Surgeon: Blane Ohara, MD;  Location: [facility] CATH LAB;  Service: Cardiovascular;  Laterality: N/A;   PROSTATE SURGERY  march 2009   for stone and prostate trimmed   TRANSURETHRAL RESECTION OF PROSTATE N/A 11/29/2014   Procedure: CYSTO TRANSURETHRAL RESECTION OF THE PROSTATE (TURP);  Surgeon: Festus Aloe, MD;  Location: [facility];  Service: Urology;  Laterality: N/A;    Family History  Problem Relation Age of Onset   Stroke Father  Hyperlipidemia Father    Hypertension Father     Social History   Socioeconomic History   Marital status: Married    Spouse name: Not on file   Number of children: Not on file   Years of education: Not on file   Highest education level: Not on file  Occupational History   Not on file  Tobacco Use   Smoking status: Former     Packs/day: 0.25    Years: 10.00    Pack years: 2.50    Types: Cigarettes    Quit date: 03/23/1963    Years since quitting: 57.5   Smokeless tobacco: Former   Tobacco comments:    Quit in [place] Use: Never used  Substance and Sexual Activity   Alcohol use: No   Drug use: No   Sexual activity: Not on file  Other Topics Concern   Not on file  Social History Narrative   Not on file   Social Determinants of Health   Financial Resource Strain: Not on file  Food Insecurity: Not on file  Transportation Needs: Not on file  Physical Activity: Not on file  Stress: Not on file  Social Connections: Not on file  Intimate Partner Violence: Not on file    Outpatient Medications Prior to Visit  Medication Sig Dispense Refill   acetaminophen (TYLENOL) 500 MG tablet Take 1,000 mg by mouth every 6 (six) hours as needed for mild pain, moderate pain, fever or headache.     albuterol (PROVENTIL) (2.5 MG/3ML) 0.083% nebulizer solution USE 1 VIAL IN NEBULIZER 3 TIMES DAILY 90 mL 11   Artificial Tear Ointment (DRY EYES OP) Place 2 drops into both eyes daily as needed (for dry eyes).     aspirin EC 81 MG tablet Take 81 mg by mouth at bedtime.     famotidine (PEPCID) 20 MG tablet Take 20 mg by mouth 2 (two) times daily.     fenofibrate (TRICOR) 145 MG tablet TAKE ONE (1) TABLET ONCE DAILY 90 tablet 2   finasteride (PROSCAR) 5 MG tablet TAKE ONE (1) TABLET ONCE DAILY 90 tablet 2   fish oil-omega-3 fatty acids 1000 MG capsule Take 4 g by mouth 2 (two) times daily.     ipratropium (ATROVENT) 0.02 % nebulizer solution USE 1 VIAL IN NEBULIZER 3 TIMES DAILY 90 mL 11   ramipril (ALTACE) 10 MG capsule Take 1 capsule (10 mg total) by mouth daily. 90 capsule 2   guaiFENesin (MUCINEX) 600 MG 12 hr tablet Take 600 mg by mouth 2 (two) times daily.     No facility-administered medications prior to visit.    Allergies  Allergen Reactions   Penicillins Hives    Has patient had a PCN reaction  causing immediate rash, facial/tongue/throat swelling, SOB or lightheadedness with hypotension: No Has patient had a PCN reaction causing severe rash involving mucus membranes or skin necrosis: No Has patient had a PCN reaction that required hospitalization: No Has patient had a PCN reaction occurring within the last 10 years: No If all of the above answers are "NO", then may proceed with Cephalosporin use.    Statins Other (See Comments)    myalgias    ROS Review of Systems  Constitutional:  Negative for chills, fatigue and fever.       In rollator  HENT:  Negative for congestion, ear pain and sore throat.   Respiratory:  Negative for cough and shortness of breath.   Cardiovascular:  Negative for chest pain.  Gastrointestinal:  Negative for abdominal pain, constipation, diarrhea, nausea and vomiting.  Endocrine: Negative for polydipsia, polyphagia and polyuria.  Genitourinary:  Negative for dysuria and frequency.  Musculoskeletal:  Negative for arthralgias (hands) and myalgias.  Neurological:  Negative for dizziness and headaches.  Psychiatric/Behavioral:  Negative for dysphoric mood.        No dysphoria     Objective:    Physical Exam Vitals reviewed.  HENT:     Right Ear: Tympanic membrane normal.     Left Ear: Tympanic membrane normal.     Nose: Nose normal.     Mouth/Throat:     Mouth: Mucous membranes are moist.     Pharynx: Oropharynx is clear.  Eyes:     Extraocular Movements: Extraocular movements intact.     Conjunctiva/sclera: Conjunctivae normal.     Pupils: Pupils are equal, round, and reactive to light.  Cardiovascular:     Rate and Rhythm: Normal rate and regular rhythm.     Pulses: Normal pulses.     Heart sounds: Normal heart sounds. No murmur heard.   No gallop.  Pulmonary:     Effort: Pulmonary effort is normal. No respiratory distress.     Breath sounds: Normal breath sounds. No wheezing.  Abdominal:     General: Abdomen is flat. Bowel sounds are  normal. There is no distension.     Palpations: Abdomen is soft.     Tenderness: There is no abdominal tenderness.  Musculoskeletal:     Cervical back: Normal range of motion.  Skin:    General: Skin is warm.     Capillary Refill: Capillary refill takes less than 2 seconds.  Neurological:     General: No focal deficit present.     Mental Status: He is alert and oriented to person, place, and time. Mental status is at baseline.  Psychiatric:        Mood and Affect: Mood normal.        Thought Content: Thought content normal.    BP 110/62   Pulse 60   Temp (!) 97.4 F (36.3 C)   Ht 5' 11" (1.803 m)   Wt 128 lb (58.1 kg)   BMI 17.85 kg/m  Wt Readings from Last 3 Encounters:  10/02/20 128 lb (58.1 kg)  07/01/20 150 lb (68 kg)  03/19/20 143 lb (64.9 kg)     Health Maintenance Due  Topic Date Due   COVID-19 Vaccine (1) Never done   TETANUS/TDAP  Never done   Zoster Vaccines- Shingrix (1 of 2) Never done   DEXA SCAN  Never done    There are no preventive care reminders to display for this patient.  Lab Results  Component Value Date   TSH 2.380 03/19/2020   Lab Results  Component Value Date   WBC 5.1 07/01/2020   HGB 16.1 07/01/2020   HCT 48.4 07/01/2020   MCV 94 07/01/2020   PLT 199 07/01/2020   Lab Results  Component Value Date   NA 142 07/01/2020   K 3.9 07/01/2020   CO2 19 (L) 07/01/2020   GLUCOSE 88 07/01/2020   BUN 26 07/01/2020   CREATININE 1.63 (H) 07/01/2020   BILITOT 0.7 07/01/2020   ALKPHOS 40 (L) 07/01/2020   AST 21 07/01/2020   ALT 15 07/01/2020   PROT 6.8 07/01/2020   ALBUMIN 4.5 07/01/2020   CALCIUM 9.6 07/01/2020   ANIONGAP 4 (L) 12/20/2014   EGFR 40 (L) 07/01/2020   Lab  Results  Component Value Date   CHOL 227 (H) 07/01/2020   Lab Results  Component Value Date   HDL 32 (L) 07/01/2020   Lab Results  Component Value Date   LDLCALC 161 (H) 07/01/2020   Lab Results  Component Value Date   TRIG 181 (H) 07/01/2020   Lab Results   Component Value Date   CHOLHDL 7.1 (H) 07/01/2020   Lab Results  Component Value Date   HGBA1C 5.4 08/20/2009      Assessment & Plan:   Problem List Items Addressed This Visit       Cardiovascular and Mediastinum   Essential hypertension - Primary   Relevant Orders   CBC with Differential   Comprehensive metabolic panel   Lipid panel   AMB Referral to [place] An individual hypertension care plan was established and reinforced today.  The patient's status was assessed using clinical findings on exam and labs or diagnostic tests. The patient's success at meeting treatment goals on disease specific evidence-based guidelines and found to be fair controlled. SELF MANAGEMENT: The patient and I together assessed ways to personally work towards obtaining the recommended goals. RECOMMENDATIONS: avoid decongestants found in common cold remedies, decrease consumption of alcohol, perform routine monitoring of BP with home BP cuff, exercise, reduction of dietary salt, take medicines as prescribed, try not to miss doses and quit smoking.  Regular exercise and maintaining a healthy weight is needed.  Stress reduction may help. A CLINICAL SUMMARY including written plan identify barriers to care unique to individual due to social or financial issues.  We attempt to mutually creat solutions for individual and family understanding.     CAD (coronary artery disease)   Relevant Orders   AMB Referral to [place] An individual plan was formulated based on patient history and exam, labs and evidence based data. Patient has not had recent angina or nitroglycerin use. continue present treatment.      Respiratory   COPD (chronic obstructive pulmonary disease) ([place]) An individualize plan was formulated for care of COPD.  Treatment is evidence based.  She will continue on inhalers, avoid smoking and smoke.  Regular exercise with help with dyspnea. Routine follow ups and  medication compliance is needed.      Nervous and Auditory   Unspecified dementia with behavioral disturbance ([place])   Relevant Orders   AMB Referral to [place] Patient has dementia with inability to complete mmse     Genitourinary   Chronic kidney disease   Relevant Orders   CBC with Differential   Comprehensive metabolic panel   Lipid panel   AMB Referral to [place]     Other   Hyperlipidemia   Relevant Orders   CBC with Differential   Comprehensive metabolic panel   Lipid panel AN INDIVIDUAL CARE PLAN for hyperlipidemia/ cholesterol was established and reinforced today.  The patient's status was assessed using clinical findings on exam, lab and other diagnostic tests. The patient's disease status was assessed based on evidence-based guidelines and found to be fair controlled. MEDICATIONS were reviewed. SELF MANAGEMENT GOALS have been discussed and patient's success at attaining the goal of low cholesterol was assessed. RECOMMENDATION given include regular exercise 3 days a week and low cholesterol/low fat diet. CLINICAL SUMMARY including written plan to identify barriers unique to the patient due to social or economic  reasons was discussed.     Malnutrition of moderate degree (HCC)   Relevant Orders   AMB Referral  to [facility] Supplement nutrition with protein/calorie supplement with meals to improve nutritional status.     Myalgia due to statin She is unable to tolerate statin therapy    Adult BMI <19 kg/sq m Patient has malnutrition and low BMI  30 plus minute workup and review of records. S   Follow-up: Return in about 6 months (around 04/04/2021) for fasting.    Reinaldo Meeker, MD

## 2020-10-03 LAB — COMPREHENSIVE METABOLIC PANEL
ALT: 10 IU/L (ref 0–44)
AST: 13 IU/L (ref 0–40)
Albumin/Globulin Ratio: 2.4 — ABNORMAL HIGH (ref 1.2–2.2)
Albumin: 4.6 g/dL (ref 3.5–4.6)
Alkaline Phosphatase: 37 IU/L — ABNORMAL LOW (ref 44–121)
BUN/Creatinine Ratio: 18 (ref 10–24)
BUN: 24 mg/dL (ref 10–36)
Bilirubin Total: 0.5 mg/dL (ref 0.0–1.2)
CO2: 21 mmol/L (ref 20–29)
Calcium: 9.9 mg/dL (ref 8.6–10.2)
Chloride: 106 mmol/L (ref 96–106)
Creatinine, Ser: 1.35 mg/dL — ABNORMAL HIGH (ref 0.76–1.27)
Globulin, Total: 1.9 g/dL (ref 1.5–4.5)
Glucose: 92 mg/dL (ref 65–99)
Potassium: 4.3 mmol/L (ref 3.5–5.2)
Sodium: 141 mmol/L (ref 134–144)
Total Protein: 6.5 g/dL (ref 6.0–8.5)
eGFR: 50 mL/min/{1.73_m2} — ABNORMAL LOW (ref >59)

## 2020-10-03 LAB — LIPID PANEL
Chol/HDL Ratio: 6.3 ratio — ABNORMAL HIGH (ref 0.0–5.0)
Cholesterol, Total: 227 mg/dL — ABNORMAL HIGH (ref 100–199)
HDL: 36 mg/dL — ABNORMAL LOW (ref >39)
LDL Chol Calc (NIH): 160 mg/dL — ABNORMAL HIGH (ref 0–99)
Triglycerides: 169 mg/dL — ABNORMAL HIGH (ref 0–149)
VLDL Cholesterol Cal: 31 mg/dL (ref 5–40)

## 2020-10-03 LAB — CBC WITH DIFFERENTIAL/PLATELET
Basophils Absolute: 0 10*3/uL (ref 0.0–0.2)
Basos: 1 %
EOS (ABSOLUTE): 0.1 10*3/uL (ref 0.0–0.4)
Eos: 3 %
Hematocrit: 46.4 % (ref 37.5–51.0)
Hemoglobin: 15.6 g/dL (ref 13.0–17.7)
Immature Grans (Abs): 0 10*3/uL (ref 0.0–0.1)
Immature Granulocytes: 1 %
Lymphocytes Absolute: 1 10*3/uL (ref 0.7–3.1)
Lymphs: 25 %
MCH: 31.7 pg (ref 26.6–33.0)
MCHC: 33.6 g/dL (ref 31.5–35.7)
MCV: 94 fL (ref 79–97)
Monocytes Absolute: 0.4 10*3/uL (ref 0.1–0.9)
Monocytes: 9 %
Neutrophils Absolute: 2.6 10*3/uL (ref 1.4–7.0)
Neutrophils: 61 %
Platelets: 192 10*3/uL (ref 150–450)
RBC: 4.92 x10E6/uL (ref 4.14–5.80)
RDW: 13.5 % (ref 11.6–15.4)
WBC: 4.2 10*3/uL (ref 3.4–10.8)

## 2020-10-03 LAB — CARDIOVASCULAR RISK ASSESSMENT

## 2020-10-03 NOTE — Progress Notes (Signed)
CBC normal, Kidney tests stage 3a, mild, lover tests normal, triglycerides high 169, LDL cholesterol 160, statin intolerance,  lp

## 2020-10-04 ENCOUNTER — Other Ambulatory Visit: Payer: Self-pay | Admitting: Legal Medicine

## 2020-10-04 DIAGNOSIS — R3911 Hesitancy of micturition: Secondary | ICD-10-CM

## 2020-10-04 DIAGNOSIS — N401 Enlarged prostate with lower urinary tract symptoms: Secondary | ICD-10-CM

## 2020-10-18 ENCOUNTER — Other Ambulatory Visit: Payer: Self-pay | Admitting: Legal Medicine

## 2020-10-18 DIAGNOSIS — E782 Mixed hyperlipidemia: Secondary | ICD-10-CM

## 2020-11-27 ENCOUNTER — Telehealth: Payer: Self-pay
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
## 2020-12-04 ENCOUNTER — Ambulatory Visit (INDEPENDENT_AMBULATORY_CARE_PROVIDER_SITE_OTHER): Payer: Medicare Other

## 2020-12-04 ENCOUNTER — Other Ambulatory Visit: Payer: Self-pay

## 2020-12-04 DIAGNOSIS — E782 Mixed hyperlipidemia: Secondary | ICD-10-CM

## 2020-12-04 DIAGNOSIS — I10 Essential (primary) hypertension: Secondary | ICD-10-CM

## 2020-12-04 DIAGNOSIS — F0391 Unspecified dementia with behavioral disturbance: Secondary | ICD-10-CM

## 2020-12-04 DIAGNOSIS — T466X5A Adverse effect of antihyperlipidemic and antiarteriosclerotic drugs, initial encounter: Secondary | ICD-10-CM

## 2020-12-04 NOTE — Patient Instructions (Signed)
Visit Information   Goals Addressed             This Visit's Progress    Track and Manage My Blood Pressure-Hypertension       Timeframe:  Long-Range Goal Priority:  High Start Date:                             Expected End Date:                       Follow Up Date 12/04/21   - check blood pressure weekly - write blood pressure results in a log or diary    Why is this important?   You won't feel high blood pressure, but it can still hurt your blood vessels.  High blood pressure can cause heart or kidney problems. It can also cause a stroke.  Making lifestyle changes like losing a little weight or eating less salt will help.  Checking your blood pressure at home and at different times of the day can help to control blood pressure.  If the doctor prescribes medicine remember to take it the way the doctor ordered.  Call the office if you cannot afford the medicine or if there are questions about it.     Notes:        Patient Care Plan: CCM Pharmacy Care Plan     Problem Identified: HTN, Cholesterol, Dementia   Priority: High  Onset Date: 12/04/2020     Long-Range Goal: Disease State Management   Start Date: 12/04/2020  Expected End Date: 12/04/2021  This Visit's Progress: On track  Priority: High  Note:   Current Barriers:  Does not maintain contact with provider office  Pharmacist Clinical Goal(s):  Patient will contact provider office for questions/concerns as evidenced notation of same in electronic health record through collaboration with PharmD and provider.   Interventions: 1:1 collaboration with [name], MD regarding development and update of comprehensive plan of care as evidenced by provider attestation and co-signature Inter-disciplinary care team collaboration (see longitudinal plan of care) Comprehensive medication review performed; medication list updated in electronic medical record  Hypertension (BP goal <140/90) BP Readings from Last  3 Encounters:  10/02/20 110/62  07/01/20 110/64  03/19/20 128/84  -Controlled -Current treatment: Ramapril -Medications previously tried: Metoprolol  -Current home readings: 110-120/60-70 (Wife is former nurse who takes BP) -Current dietary habits: Wife states, "He's 4 years old and has dementia so doesn't understand if I tell him he can't eat a cookie so I just let him have it -Current exercise habits: None due to age and status -Denies hypotensive/hypertensive symptoms -Educated on BP goals and benefits of medications for prevention of heart attack, stroke and kidney damage; -Counseled to monitor BP at home Weekly, document, and provide log at future appointments -Recommended to continue current medication  Hyperlipidemia: (LDL goal < 100) Lab Results  Component Value Date   CHOL 227 (H) 10/02/2020   CHOL 227 (H) 07/01/2020   CHOL 240 (H) 03/19/2020   Lab Results  Component Value Date   HDL 36 (L) 10/02/2020   HDL 32 (L) 07/01/2020   HDL 40 03/19/2020   Lab Results  Component Value Date   LDLCALC 160 (H) 10/02/2020   LDLCALC 161 (H) 07/01/2020   LDLCALC 176 (H) 03/19/2020   Lab Results  Component Value Date   TRIG 169 (H) 10/02/2020   TRIG 181 (H) 07/01/2020  TRIG 131 03/19/2020   Lab Results  Component Value Date   CHOLHDL 6.3 (H) 10/02/2020   CHOLHDL 7.1 (H) 07/01/2020   CHOLHDL 6.0 (H) 03/19/2020   Lab Results  Component Value Date   LDLDIRECT 99.5 05/09/2012   LDLDIRECT 61.0 04/12/2011   LDLDIRECT 130.8 01/11/2011  -Not ideally controlled  *Per wife, patient has had high Trig for whole life but other cholesterol was normal until he had HA in the 80's, then all his levels became bad -Current treatment: Fenofibrate -Medications previously tried: All statins (Does have ICD code in profile) -Current dietary habits: Wife states, "He's 87 years old and has dementia so doesn't understand if I tell him he can't eat a cookie so I just let him have it -Current  exercise habits: None due to age and status -Educated on Cholesterol goals;  -Recommended to continue current medication  Allergies -Controlled -Current treatment  Benadryl -Medications previously tried: N/A  September 2022: Wife told me that the Pharmacists at the [place] clinic told them to use Benadryl for his (primarily fall time) allergies. She doesn't like to because it can make him wired/tired. I told her not to use Benadryl but to use a 2nd Generation (I explained the differences between generations)  Patient Goals/Self-Care Activities Patient will:  - take medications as prescribed  Follow Up Plan: The patient has been provided with contact information for the care management team and has been advised to call with any health related questions or concerns.       [name] was given information about Chronic Care Management services today including:  CCM service includes personalized support from designated clinical staff supervised by his physician, including individualized plan of care and coordination with other care providers 24/7 contact phone numbers for assistance for urgent and routine care needs. Standard insurance, coinsurance, copays and deductibles apply for chronic care management only during months in which we provide at least 20 minutes of these services. Most insurances cover these services at 100%, however patients may be responsible for any copay, coinsurance and/or deductible if applicable. This service may help you avoid the need for more expensive face-to-face services. Only one practitioner may furnish and bill the service in a calendar month. The patient may stop CCM services at any time (effective at the end of the month) by phone call to the office staff.  Patient agreed to services and verbal consent obtained.   The patient verbalized understanding of instructions, educational materials, and care plan provided today and declined offer to receive copy of  patient instructions, educational materials, and care plan.  The pharmacy team will reach out to the patient again over the next 90 days.   Lane Hacker, [facility]

## 2020-12-04 NOTE — Progress Notes (Signed)
Chronic Care Management Pharmacy Note  12/04/2020 Name:  [name] MRN:  [id number] DOB:  1928/10/26  Summary: -Due to dementia, patient doesn't talk to anyone other than his wife/daughter. Because of this, primarily coordinated care with wife, Inez Catalina. She is a former [employer] scientist and has taken care of him for 8 years (She retired 9 years ago to spend time together, only had 1 year before his health got bad and she had to become a care-giver).   Recommendations/Changes made from today's visit: -Take 2nd gen anti-histamine, not Benadryl -Will forward chart to get AWV before end of year  Plan: -RAF score of 2.141 so my team will follow monthly and I will check in Q 6 months   Subjective: [name] is an 87 y.o. year old adult who is a primary patient of Henrene Pastor, Zeb Comfort, MD.  The CCM team was consulted for assistance with disease management and care coordination needs.    Engaged with patient by telephone for initial visit in response to provider referral for pharmacy case management and/or care coordination services.   Consent to Services:  The patient was given the following information about Chronic Care Management services today, agreed to services, and gave verbal consent: 1. CCM service includes personalized support from designated clinical staff supervised by the primary care provider, including individualized plan of care and coordination with other care providers 2. 24/7 contact phone numbers for assistance for urgent and routine care needs. 3. Service will only be billed when office clinical staff spend 20 minutes or more in a month to coordinate care. 4. Only one practitioner may furnish and bill the service in a calendar month. 5.The patient may stop CCM services at any time (effective at the end of the month) by phone call to the office staff. 6. The patient will be responsible for cost sharing (co-pay) of up to 20% of the service fee (after annual deductible is met). Patient  agreed to services and consent obtained.  Patient Care Team: [name], MD as PCP - General (Family Medicine) Stanford Breed Denice Bors, MD as PCP - Cardiology (Cardiology) Lowella Bandy, MD (Inactive) as Consulting Physician (Urology) Burnice Logan, [facility] (Inactive) as Pharmacist (Pharmacist)  Recent office visits:  10/02/20- Reinaldo Meeker, MD- seen for chronic conditions,  labs ordered, no medication changes, follow up 6 months  07/01/20- Reinaldo Meeker, MD-  seen for chronic conditions,  labs ordered, no medication changes, follow up 6 months    Recent consult visits:  No visits noted   Hospital visits:  None in previous 6 months   Objective:  Lab Results  Component Value Date   CREATININE 1.35 (H) 10/02/2020   BUN 24 10/02/2020   GFRNONAA 42 (L) 03/19/2020   GFRAA 48 (L) 03/19/2020   NA 141 10/02/2020   K 4.3 10/02/2020   CALCIUM 9.9 10/02/2020   CO2 21 10/02/2020   GLUCOSE 92 10/02/2020    Lab Results  Component Value Date/Time   HGBA1C 5.4 08/20/2009 08:15 AM    Last diabetic Eye exam: No results found for: HMDIABEYEEXA  Last diabetic Foot exam: No results found for: HMDIABFOOTEX   Lab Results  Component Value Date   CHOL 227 (H) 10/02/2020   HDL 36 (L) 10/02/2020   LDLCALC 160 (H) 10/02/2020   LDLDIRECT 99.5 05/09/2012   TRIG 169 (H) 10/02/2020   CHOLHDL 6.3 (H) 10/02/2020    Hepatic Function Latest Ref Rng & Units 10/02/2020 07/01/2020 03/19/2020  Total Protein 6.0 -  8.5 g/dL 6.5 6.8 6.4  Albumin 3.5 - 4.6 g/dL 4.6 4.5 4.4  AST 0 - 40 IU/L $Remov'13 21 20  'njIxjd$ ALT 0 - 44 IU/L $Remov'10 15 18  'MJWaJo$ Alk Phosphatase 44 - 121 IU/L 37(L) 40(L) 40(L)  Total Bilirubin 0.0 - 1.2 mg/dL 0.5 0.7 0.4  Bilirubin, Direct 0.0 - 0.3 mg/dL - - -    Lab Results  Component Value Date/Time   TSH 2.380 03/19/2020 09:12 AM   TSH 0.668 06/05/2013 09:47 PM    CBC Latest Ref Rng & Units 10/02/2020 07/01/2020 03/19/2020  WBC 3.4 - 10.8 x10E3/uL 4.2 5.1 7.0  Hemoglobin 13.0 - 17.7 g/dL 15.6  16.1 15.0  Hematocrit 37.5 - 51.0 % 46.4 48.4 43.9  Platelets 150 - 450 x10E3/uL 192 199 192    No results found for: VD25OH  Clinical ASCVD: Yes  The ASCVD Risk score (Arnett DK, et al., 2019) failed to calculate for the following reasons:   The 2019 ASCVD risk score is only valid for ages 88 to 16    Depression screen PHQ 2/9 10/02/2020 09/12/2019  Decreased Interest 0 0  Down, Depressed, Hopeless 0 0  PHQ - 2 Score 0 0     Other: (CHADS2VASc if Afib, MMRC or CAT for COPD, ACT, DEXA)  Social History   Tobacco Use  Smoking Status Former   Packs/day: 0.25   Years: 10.00   Pack years: 2.50   Types: Cigarettes   Quit date: 03/23/1963   Years since quitting: 57.7  Smokeless Tobacco Former  Tobacco Comments   Quit in 1965   BP Readings from Last 3 Encounters:  10/02/20 110/62  07/01/20 110/64  03/19/20 128/84   Pulse Readings from Last 3 Encounters:  10/02/20 60  07/01/20 76  03/19/20 77   Wt Readings from Last 3 Encounters:  10/02/20 128 lb (58.1 kg)  07/01/20 150 lb (68 kg)  03/19/20 143 lb (64.9 kg)   BMI Readings from Last 3 Encounters:  10/02/20 17.85 kg/m  07/01/20 20.92 kg/m  03/19/20 19.94 kg/m    Assessment/Interventions: Review of patient past medical history, allergies, medications, health status, including review of consultants reports, laboratory and other test data, was performed as part of comprehensive evaluation and provision of chronic care management services.   SDOH:  (Social Determinants of Health) assessments and interventions performed: Yes SDOH Interventions    Flowsheet Row Most Recent Value  SDOH Interventions   Transportation Interventions Intervention Not Indicated      SDOH Screenings   Alcohol Screen: Not on file  Depression (PHQ2-9): Low Risk    PHQ-2 Score: 0  Financial Resource Strain: Not on file  Food Insecurity: Not on file  Housing: Not on file  Physical Activity: Not on file  Social Connections: Not on file   Stress: Not on file  Tobacco Use: Medium Risk   Smoking Tobacco Use: Former   Smokeless Tobacco Use: Former  Transport planner Needs: No Data processing manager (Medical): No   Lack of Transportation (Non-Medical): No    CCM Care Plan  Allergies  Allergen Reactions   Penicillins Hives    Has patient had a PCN reaction causing immediate rash, facial/tongue/throat swelling, SOB or lightheadedness with hypotension: No Has patient had a PCN reaction causing severe rash involving mucus membranes or skin necrosis: No Has patient had a PCN reaction that required hospitalization: No Has patient had a PCN reaction occurring within the last 10 years: No If all of the above answers  are "NO", then may proceed with Cephalosporin use.    Statins Other (See Comments)    myalgias    Medications Reviewed Today     Reviewed by Lane Hacker, [facility] (Pharmacist) on 12/04/20 at [phone]  Med List Status: <None>   Medication Order Taking? Sig Documenting Provider Last Dose Status Informant  acetaminophen (TYLENOL) 500 MG tablet [id number]  Take 1,000 mg by mouth every 6 (six) hours as needed for mild pain, moderate pain, fever or headache. [provider]  Active Spouse/Significant Other  albuterol (PROVENTIL) (2.5 MG/3ML) 0.083% nebulizer solution [id number] Yes USE 1 VIAL IN NEBULIZER 3 TIMES DAILY [name], MD Taking Active   Artificial Tear Ointment (DRY EYES OP) [id number] Yes Place 2 drops into both eyes daily as needed (for dry eyes). [provider] Taking Active Spouse/Significant Other  aspirin EC 81 MG tablet [id number] Yes Take 81 mg by mouth at bedtime. [provider] Taking Active Spouse/Significant Other           Med Note (MATHIS, Caryn Section   Tue Nov 20, 2019  8:52 AM)    famotidine (PEPCID) 20 MG tablet [id number] Yes Take 20 mg by mouth 2 (two) times daily. [provider] Taking Active   fenofibrate (TRICOR) 145 MG tablet  [id number] Yes TAKE ONE TABLET DAILY [name], MD Taking Active   finasteride (PROSCAR) 5 MG tablet [id number] Yes TAKE ONE TABLET DAILY [name], MD Taking Active   fish oil-omega-3 fatty acids 1000 MG capsule [id number] Yes Take 4 g by mouth 2 (two) times daily. [provider] Taking Active Spouse/Significant Other           Med Note (MATHIS, Caryn Section   Tue Nov 20, 2019  8:53 AM)    ipratropium (ATROVENT) 0.02 % nebulizer solution [id number] Yes USE 1 VIAL IN NEBULIZER 3 TIMES DAILY [name], MD Taking Active   ramipril (ALTACE) 10 MG capsule [id number] Yes Take 1 capsule (10 mg total) by mouth daily. [name], MD Taking Active             Patient Active Problem List   Diagnosis Date Noted   Adult BMI <19 kg/sq m 10/02/2020   Myalgia due to statin 07/02/2020   Arthritis of right hand 03/19/2020   UTI (urinary tract infection) 02/22/2020   Pneumonia 02/11/2020    Class: Acute   Malnutrition of moderate degree ([place]) 09/12/2019   Hyperlipidemia    COPD (chronic obstructive pulmonary disease) (HCC)    Chronic kidney disease    Unspecified dementia with behavioral disturbance ([place])    Preop cardiovascular exam 10/07/2014   CAD (coronary artery disease) 06/07/2013   S/P CABG (coronary artery bypass graft) 1988 06/05/2013   Unstable angina ([place]) 06/05/2013   Syncope 06/05/2013   CAROTID ARTERY DISEASE 01/23/2010   Essential hypertension 11/29/2008   CAD, NATIVE VESSEL 11/29/2008   TACHYCARDIA 11/29/2008    Immunization History  Administered Date(s) Administered   Fluad Quad(high Dose 65+) 02/22/2020   Influenza-Unspecified 02/19/2013   Pneumococcal Conjugate-13 07/11/2014   Pneumococcal Polysaccharide-23 07/10/2013    Conditions to be addressed/monitored:  Hypertension and Hyperlipidemia  Care Plan : [place]  Updates made by Lane Hacker, RPH since 12/04/2020 12:00 AM     Problem: HTN,  Cholesterol, Dementia   Priority: High  Onset Date: 12/04/2020     Long-Range Goal: Disease State Management   Start Date: 12/04/2020  Expected End Date: 12/04/2021  This Visit's Progress: On track  Priority: High  Note:   Current Barriers:  Does not maintain contact with provider office  Pharmacist Clinical Goal(s):  Patient will contact provider office for questions/concerns as evidenced notation of same in electronic health record through collaboration with PharmD and provider.   Interventions: 1:1 collaboration with [name], MD regarding development and update of comprehensive plan of care as evidenced by provider attestation and co-signature Inter-disciplinary care team collaboration (see longitudinal plan of care) Comprehensive medication review performed; medication list updated in electronic medical record  Hypertension (BP goal <140/90) BP Readings from Last 3 Encounters:  10/02/20 110/62  07/01/20 110/64  03/19/20 128/84  -Controlled -Current treatment: Ramapril -Medications previously tried: Metoprolol  -Current home readings: 110-120/60-70 (Wife is former nurse who takes BP) -Current dietary habits: Wife states, "He's 4 years old and has dementia so doesn't understand if I tell him he can't eat a cookie so I just let him have it -Current exercise habits: None due to age and status -Denies hypotensive/hypertensive symptoms -Educated on BP goals and benefits of medications for prevention of heart attack, stroke and kidney damage; -Counseled to monitor BP at home Weekly, document, and provide log at future appointments -Recommended to continue current medication  Hyperlipidemia: (LDL goal < 100) Lab Results  Component Value Date   CHOL 227 (H) 10/02/2020   CHOL 227 (H) 07/01/2020   CHOL 240 (H) 03/19/2020   Lab Results  Component Value Date   HDL 36 (L) 10/02/2020   HDL 32 (L) 07/01/2020   HDL 40 03/19/2020   Lab Results  Component Value Date    LDLCALC 160 (H) 10/02/2020   LDLCALC 161 (H) 07/01/2020   LDLCALC 176 (H) 03/19/2020   Lab Results  Component Value Date   TRIG 169 (H) 10/02/2020   TRIG 181 (H) 07/01/2020   TRIG 131 03/19/2020   Lab Results  Component Value Date   CHOLHDL 6.3 (H) 10/02/2020   CHOLHDL 7.1 (H) 07/01/2020   CHOLHDL 6.0 (H) 03/19/2020   Lab Results  Component Value Date   LDLDIRECT 99.5 05/09/2012   LDLDIRECT 61.0 04/12/2011   LDLDIRECT 130.8 01/11/2011  -Not ideally controlled  *Per wife, patient has had high Trig for whole life but other cholesterol was normal until he had HA in the 80's, then all his levels became bad -Current treatment: Fenofibrate -Medications previously tried: All statins (Does have ICD code in profile) -Current dietary habits: Wife states, "He's 77 years old and has dementia so doesn't understand if I tell him he can't eat a cookie so I just let him have it -Current exercise habits: None due to age and status -Educated on Cholesterol goals;  -Recommended to continue current medication  Allergies -Controlled -Current treatment  Benadryl -Medications previously tried: N/A  September 2022: Wife told me that the Pharmacists at the [place] clinic told them to use Benadryl for his (primarily fall time) allergies. She doesn't like to because it can make him wired/tired. I told her not to use Benadryl but to use a 2nd Generation (I explained the differences between generations)  Patient Goals/Self-Care Activities Patient will:  - take medications as prescribed  Follow Up Plan: The patient has been provided with contact information for the care management team and has been advised to call with any health related questions or concerns.        Medication Assistance: None required.  Patient affirms current coverage meets needs.  Compliance/Adherence/Medication fill history: Care Gaps: Last annual  wellness visit? None noted If applicable: N/A  Last eye exam /  retinopathy screening? Last diabetic foot exam?  Star Rating Drugs:  ramipril 10 MG -90 DS last filled 09/08/20  Patient's preferred pharmacy is:  [facility], [address] [place]. [place] [address] Phone: [phone] Fax: [place], [place] [address] Phone: [phone] Fax: [phone]  Uses pill box? Yes Pt endorses 100% compliance  Care Plan and Follow Up Patient Decision:  Patient agrees to Care Plan and Follow-up.  Plan: The care management team will reach out to the patient again over the next 90 days.

## 2020-12-19 DIAGNOSIS — E782 Mixed hyperlipidemia: Secondary | ICD-10-CM

## 2020-12-19 DIAGNOSIS — I10 Essential (primary) hypertension: Secondary | ICD-10-CM | POA: Diagnosis not present

## 2020-12-19 DIAGNOSIS — F0391 Unspecified dementia with behavioral disturbance: Secondary | ICD-10-CM | POA: Diagnosis not present

## 2020-12-22 ENCOUNTER — Telehealth: Payer: Self-pay

## 2020-12-22 NOTE — Chronic Care Management (AMB) (Signed)
Chronic Care Management Pharmacy Assistant   Name: [name]  MRN: [id number] DOB: Mar 06, 1929   Reason for Encounter: Disease State/ Hypertension   Recent office visits:  None  Recent consult visits:  None  Hospital visits:  None in previous 6 months  Medications: Outpatient Encounter Medications as of 12/22/2020  Medication Sig   acetaminophen (TYLENOL) 500 MG tablet Take 1,000 mg by mouth every 6 (six) hours as needed for mild pain, moderate pain, fever or headache.   albuterol (PROVENTIL) (2.5 MG/3ML) 0.083% nebulizer solution USE 1 VIAL IN NEBULIZER 3 TIMES DAILY   Artificial Tear Ointment (DRY EYES OP) Place 2 drops into both eyes daily as needed (for dry eyes).   aspirin EC 81 MG tablet Take 81 mg by mouth at bedtime.   famotidine (PEPCID) 20 MG tablet Take 20 mg by mouth 2 (two) times daily.   fenofibrate (TRICOR) 145 MG tablet TAKE ONE TABLET DAILY   finasteride (PROSCAR) 5 MG tablet TAKE ONE TABLET DAILY   fish oil-omega-3 fatty acids 1000 MG capsule Take 4 g by mouth 2 (two) times daily.   ipratropium (ATROVENT) 0.02 % nebulizer solution USE 1 VIAL IN NEBULIZER 3 TIMES DAILY   ramipril (ALTACE) 10 MG capsule Take 1 capsule (10 mg total) by mouth daily.   No facility-administered encounter medications on file as of 12/22/2020.    Recent Office Vitals: BP Readings from Last 3 Encounters:  10/02/20 110/62  07/01/20 110/64  03/19/20 128/84   Pulse Readings from Last 3 Encounters:  10/02/20 60  07/01/20 76  03/19/20 77    Wt Readings from Last 3 Encounters:  10/02/20 128 lb (58.1 kg)  07/01/20 150 lb (68 kg)  03/19/20 143 lb (64.9 kg)     Kidney Function Lab Results  Component Value Date/Time   CREATININE 1.35 (H) 10/02/2020 09:07 AM   CREATININE 1.63 (H) 07/01/2020 09:07 AM   GFRNONAA 42 (L) 03/19/2020 09:12 AM   GFRAA 48 (L) 03/19/2020 09:12 AM    BMP Latest Ref Rng & Units 10/02/2020 07/01/2020 03/19/2020  Glucose 65 - 99 mg/dL 92 88 89  BUN  10 - 36 mg/dL 24 26 22   Creatinine 0.76 - 1.27 mg/dL 1.35(H) 1.63(H) 1.47(H)  BUN/Creat Ratio 10 - 24 18 16 15   Sodium 134 - 144 mmol/L 141 142 140  Potassium 3.5 - 5.2 mmol/L 4.3 3.9 4.1  Chloride 96 - 106 mmol/L 106 101 104  CO2 20 - 29 mmol/L 21 19(L) 21  Calcium 8.6 - 10.2 mg/dL 9.9 9.6 9.9     Current antihypertensive regimen:  Ramipril 10 mg daily  Patient verbally confirms he is taking the above medications as directed. Yes  How often are you checking your Blood Pressure? daily  he checks his blood pressure in the middle of the day after taking his medication.  Current home BP readings: 110/60 62, 115/70 71  Wrist or arm cuff: cuff Caffeine intake: Patient's wife states he drinks tea a lot Salt intake: Limited OTC medications including pseudoephedrine or NSAIDs?  Any readings above 180/120? No  What recent interventions/DTPs have been made by any provider to improve Blood Pressure control since last CPP Visit:  Educated on BP goals and benefits of medications for prevention of heart attack, stroke and kidney damage  Any recent hospitalizations or ED visits since last visit with CPP? No  What diet changes have been made to improve Blood Pressure Control?  Patient's wife states she doesn't cook with salt  but she allows him to eat what he can since he has dementia.  What exercise is being done to improve your Blood Pressure Control?  Patient's wife states patient isn't able to do any exercise but does walk around with a cane.  Adherence Review: Is the patient currently on ACE/ARB medication? Yes Does the patient have >5 day gap between last estimated fill dates? CPP to review  Care Gaps: Last annual wellness visit? None Tdap overdue Shingrix overdue Flu vaccine overdue last completed 02-22-2020  Star Rating Drugs:  Ramipril 10 mg- Last filled 12-12-2020 90 DS   [place] Clinical Pharmacist Assistant [phone]

## 2021-01-23 ENCOUNTER — Telehealth: Payer: Self-pay

## 2021-01-23 NOTE — Chronic Care Management (AMB) (Signed)
Chronic Care Management Pharmacy Assistant   Name: [name]  MRN: [id number] DOB: May 08, 1928   Reason for Encounter: Disease State/ Hypertension   Recent office visits:  None  Recent consult visits:  None  Hospital visits:  None in previous 6 months  Medications: Outpatient Encounter Medications as of 01/23/2021  Medication Sig   acetaminophen (TYLENOL) 500 MG tablet Take 1,000 mg by mouth every 6 (six) hours as needed for mild pain, moderate pain, fever or headache.   albuterol (PROVENTIL) (2.5 MG/3ML) 0.083% nebulizer solution USE 1 VIAL IN NEBULIZER 3 TIMES DAILY   Artificial Tear Ointment (DRY EYES OP) Place 2 drops into both eyes daily as needed (for dry eyes).   aspirin EC 81 MG tablet Take 81 mg by mouth at bedtime.   famotidine (PEPCID) 20 MG tablet Take 20 mg by mouth 2 (two) times daily.   fenofibrate (TRICOR) 145 MG tablet TAKE ONE TABLET DAILY   finasteride (PROSCAR) 5 MG tablet TAKE ONE TABLET DAILY   fish oil-omega-3 fatty acids 1000 MG capsule Take 4 g by mouth 2 (two) times daily.   ipratropium (ATROVENT) 0.02 % nebulizer solution USE 1 VIAL IN NEBULIZER 3 TIMES DAILY   ramipril (ALTACE) 10 MG capsule Take 1 capsule (10 mg total) by mouth daily.   No facility-administered encounter medications on file as of 01/23/2021.    Recent Office Vitals: BP Readings from Last 3 Encounters:  10/02/20 110/62  07/01/20 110/64  03/19/20 128/84   Pulse Readings from Last 3 Encounters:  10/02/20 60  07/01/20 76  03/19/20 77    Wt Readings from Last 3 Encounters:  10/02/20 128 lb (58.1 kg)  07/01/20 150 lb (68 kg)  03/19/20 143 lb (64.9 kg)     Kidney Function Lab Results  Component Value Date/Time   CREATININE 1.35 (H) 10/02/2020 09:07 AM   CREATININE 1.63 (H) 07/01/2020 09:07 AM   GFRNONAA 42 (L) 03/19/2020 09:12 AM   GFRAA 48 (L) 03/19/2020 09:12 AM    BMP Latest Ref Rng & Units 10/02/2020 07/01/2020 03/19/2020  Glucose 65 - 99 mg/dL 92 88 89  BUN  10 - 36 mg/dL 24 26 22   Creatinine 0.76 - 1.27 mg/dL 1.35(H) 1.63(H) 1.47(H)  BUN/Creat Ratio 10 - 24 18 16 15   Sodium 134 - 144 mmol/L 141 142 140  Potassium 3.5 - 5.2 mmol/L 4.3 3.9 4.1  Chloride 96 - 106 mmol/L 106 101 104  CO2 20 - 29 mmol/L 21 19(L) 21  Calcium 8.6 - 10.2 mg/dL 9.9 9.6 9.9     Current antihypertensive regimen:  Ramipril 10 mg daily  Patient verbally confirms he is taking the above medications as directed. Yes  How often are you checking your Blood Pressure? daily  he checks his blood pressure in the middle of the day after taking his medication.  Current home BP readings: 110/60 65, 118/68 71  Wrist or arm cuff: cuff Caffeine intake: Patient's wife states he drinks tea a lot Salt intake:Limited OTC medications including pseudoephedrine or NSAIDs? Fish oil  Any readings above 180/120? No  What recent interventions/DTPs have been made by any provider to improve Blood Pressure control since last CPP Visit:  Educated on BP goals and benefits of medications for prevention of heart attack, stroke and kidney damage  Any recent hospitalizations or ED visits since last visit with CPP? No  What diet changes have been made to improve Blood Pressure Control?  Patient's wife states she doesn't cook with  salt but she allows him to eat what he can since he has dementia.  What exercise is being done to improve your Blood Pressure Control?  Patient's wife states patient isn't able to do any exercise but does walk around with a cane.  Adherence Review: Is the patient currently on ACE/ARB medication? Yes Does the patient have >5 day gap between last estimated fill dates? CPP to review  Care Gaps: Last annual wellness visit? None Tdap overdue Shingrix overdue Flu vaccine overdue last completed 02-22-2020 Covid vaccine overdue  Star Rating Drugs: Ramipril 10 mg- Last filled 12-12-2020 90 DS  [place] Clinical Pharmacist Assistant [phone]

## 2021-02-20 ENCOUNTER — Telehealth: Payer: Self-pay

## 2021-02-20 NOTE — Chronic Care Management (AMB) (Signed)
Chronic Care Management Pharmacy Assistant   Name: [name]  MRN: [id number] DOB: [birth year]  Reason for Encounter: Disease State/ Hypertension  Recent office visits:  None  Recent consult visits:  None  Hospital visits:  None in previous 6 months  Medications: Outpatient Encounter Medications as of 02/20/2021  Medication Sig   acetaminophen (TYLENOL) 500 MG tablet Take 1,000 mg by mouth every 6 (six) hours as needed for mild pain, moderate pain, fever or headache.   albuterol (PROVENTIL) (2.5 MG/3ML) 0.083% nebulizer solution USE 1 VIAL IN NEBULIZER 3 TIMES DAILY   Artificial Tear Ointment (DRY EYES OP) Place 2 drops into both eyes daily as needed (for dry eyes).   aspirin EC 81 MG tablet Take 81 mg by mouth at bedtime.   famotidine (PEPCID) 20 MG tablet Take 20 mg by mouth 2 (two) times daily.   fenofibrate (TRICOR) 145 MG tablet TAKE ONE TABLET DAILY   finasteride (PROSCAR) 5 MG tablet TAKE ONE TABLET DAILY   fish oil-omega-3 fatty acids 1000 MG capsule Take 4 g by mouth 2 (two) times daily.   ipratropium (ATROVENT) 0.02 % nebulizer solution USE 1 VIAL IN NEBULIZER 3 TIMES DAILY   ramipril (ALTACE) 10 MG capsule Take 1 capsule (10 mg total) by mouth daily.   No facility-administered encounter medications on file as of 02/20/2021.   Recent Office Vitals: BP Readings from Last 3 Encounters:  10/02/20 110/62  07/01/20 110/64  03/19/20 128/84   Pulse Readings from Last 3 Encounters:  10/02/20 60  07/01/20 76  03/19/20 77    Wt Readings from Last 3 Encounters:  10/02/20 128 lb (58.1 kg)  07/01/20 150 lb (68 kg)  03/19/20 143 lb (64.9 kg)     Kidney Function Lab Results  Component Value Date/Time   CREATININE 1.35 (H) 10/02/2020 09:07 AM   CREATININE 1.63 (H) 07/01/2020 09:07 AM   GFRNONAA 42 (L) 03/19/2020 09:12 AM   GFRAA 48 (L) 03/19/2020 09:12 AM    BMP Latest Ref Rng & Units 10/02/2020 07/01/2020 03/19/2020  Glucose 65 - 99 mg/dL 92 88 89  BUN 10 -  36 mg/dL 24 26 22   Creatinine 0.76 - 1.27 mg/dL 1.35(H) 1.63(H) 1.47(H)  BUN/Creat Ratio 10 - 24 18 16 15   Sodium 134 - 144 mmol/L 141 142 140  Potassium 3.5 - 5.2 mmol/L 4.3 3.9 4.1  Chloride 96 - 106 mmol/L 106 101 104  CO2 20 - 29 mmol/L 21 19(L) 21  Calcium 8.6 - 10.2 mg/dL 9.9 9.6 9.9     Current antihypertensive regimen:  Ramipril 10 mg daily  Patient verbally confirms he is taking the above medications as directed. Yes  How often are you checking your Blood Pressure? daily  he checks his blood pressure in the middle of the day after taking his medication.  Current home BP readings: 120/68 64, 118/62 70  Wrist or arm cuff: Cuff Caffeine intake: Patient wife states patient has cut back on caffeine due to increased agitation.  Salt intake: Limited OTC medications including pseudoephedrine or NSAIDs?  Fish oil  Any readings above 180/120? No  What recent interventions/DTPs have been made by any provider to improve Blood Pressure control since last CPP Visit:  Educated on BP goals and benefits of medications for prevention of heart attack, stroke and kidney damage  Any recent hospitalizations or ED visits since last visit with CPP? No  What diet changes have been made to improve Blood Pressure Control?  Patient's wife  states she doesn't cook with salt but she allows him to eat what he can since he has dementia.  What exercise is being done to improve your Blood Pressure Control?  Patient's wife states patient isn't able to do any exercise but does walk around with a cane.  Adherence Review: Is the patient currently on ACE/ARB medication? Yes Does the patient have >5 day gap between last estimated fill dates? CPP to review  Care Gaps: Last annual wellness visit? Patient's wife states patient hasn't had an AWV in a while do to health declining and dementia. Dr. Henrene Pastor is aware. Tdap overdue Shingrix overdue Covid vaccine overdue Flu vaccine overdue last completed  02-22-2020 Dexa scan overdue  Star Rating Drugs: Ramipril 10 mg- Last filled 12-12-2020 90 DS  [place] Clinical Pharmacist Assistant [phone]

## 2021-02-25 ENCOUNTER — Other Ambulatory Visit: Payer: Self-pay | Admitting: Legal Medicine

## 2021-03-26 ENCOUNTER — Telehealth: Payer: Self-pay

## 2021-03-26 NOTE — Chronic Care Management (AMB) (Signed)
Chronic Care Management Pharmacy Assistant   Name: [name]  MRN: [id number] DOB: 1928/06/19  Reason for Encounter: Disease State/ Hypertension  Recent office visits:  None  Recent consult visits:  None  Hospital visits:  None in previous 6 months  Medications: Outpatient Encounter Medications as of 03/26/2021  Medication Sig   acetaminophen (TYLENOL) 500 MG tablet Take 1,000 mg by mouth every 6 (six) hours as needed for mild pain, moderate pain, fever or headache.   albuterol (PROVENTIL) (2.5 MG/3ML) 0.083% nebulizer solution USE 1 VIAL IN NEBULIZER 3 TIMES DAILY   Artificial Tear Ointment (DRY EYES OP) Place 2 drops into both eyes daily as needed (for dry eyes).   aspirin EC 81 MG tablet Take 81 mg by mouth at bedtime.   famotidine (PEPCID) 20 MG tablet Take 20 mg by mouth 2 (two) times daily.   fenofibrate (TRICOR) 145 MG tablet TAKE ONE TABLET DAILY   finasteride (PROSCAR) 5 MG tablet TAKE ONE TABLET DAILY   fish oil-omega-3 fatty acids 1000 MG capsule Take 4 g by mouth 2 (two) times daily.   ipratropium (ATROVENT) 0.02 % nebulizer solution USE 1 VIAL IN NEBULIZER 3 TIMES DAILY   ramipril (ALTACE) 10 MG capsule Take 1 capsule (10 mg total) by mouth daily.   No facility-administered encounter medications on file as of 03/26/2021.   Recent Office Vitals: BP Readings from Last 3 Encounters:  10/02/20 110/62  07/01/20 110/64  03/19/20 128/84   Pulse Readings from Last 3 Encounters:  10/02/20 60  07/01/20 76  03/19/20 77    Wt Readings from Last 3 Encounters:  10/02/20 128 lb (58.1 kg)  07/01/20 150 lb (68 kg)  03/19/20 143 lb (64.9 kg)     Kidney Function Lab Results  Component Value Date/Time   CREATININE 1.35 (H) 10/02/2020 09:07 AM   CREATININE 1.63 (H) 07/01/2020 09:07 AM   GFRNONAA 42 (L) 03/19/2020 09:12 AM   GFRAA 48 (L) 03/19/2020 09:12 AM    BMP Latest Ref Rng & Units 10/02/2020 07/01/2020 03/19/2020  Glucose 65 - 99 mg/dL 92 88 89  BUN 10 - 36  mg/dL 24 26 22   Creatinine 0.76 - 1.27 mg/dL 1.35(H) 1.63(H) 1.47(H)  BUN/Creat Ratio 10 - 24 18 16 15   Sodium 134 - 144 mmol/L 141 142 140  Potassium 3.5 - 5.2 mmol/L 4.3 3.9 4.1  Chloride 96 - 106 mmol/L 106 101 104  CO2 20 - 29 mmol/L 21 19(L) 21  Calcium 8.6 - 10.2 mg/dL 9.9 9.6 9.9     Current antihypertensive regimen:  Ramipril 10 mg daily  Patient verbally confirms he is taking the above medications as directed. Yes  How often are you checking your Blood Pressure? daily  he checks his blood pressure in the afternoon after taking his medication.  Current home BP readings: 110/68 70, 120/70 68 Wrist or arm cuff: Cuff Caffeine intake: Patient wife states patient has cut back on caffeine due to increased agitation.  Salt intake: Limited OTC medications including pseudoephedrine or NSAIDs? Fish oil  Any readings above 180/120? No  What recent interventions/DTPs have been made by any provider to improve Blood Pressure control since last CPP Visit:  -Educated on BP goals and benefits of medications for prevention of heart attack, stroke and kidney damage; -Counseled to monitor BP at home Weekly, document, and provide log at future appointments  Any recent hospitalizations or ED visits since last visit with CPP? No  What diet changes have been  made to improve Blood Pressure Control?  Patient's wife states she doesn't cook with salt but she allows him to eat what he can since he has dementia.  What exercise is being done to improve your Blood Pressure Control?  Patient's wife states patient isn't able to do any exercise but does walk around with a cane.  Adherence Review: Is the patient currently on ACE/ARB medication? Yes Does the patient have >5 day gap between last estimated fill dates? CPP to review  Care Gaps: Last annual wellness visit? Patient's wife states patient hasn't had an AWV in a while do to health declining and dementia. Dr. Henrene Pastor is aware. Tdap  overdue Shingrix overdue Covid vaccine overdue Flu vaccine overdue last completed 02-22-2020 Dexa scan overdue   Star Rating Drugs: Ramipril 10 mg- Last filled 02-25-2021 90 DS  [place] Clinical Pharmacist Assistant [phone]

## 2021-04-06 ENCOUNTER — Encounter: Payer: Self-pay | Admitting: Legal Medicine

## 2021-04-08 ENCOUNTER — Ambulatory Visit (INDEPENDENT_AMBULATORY_CARE_PROVIDER_SITE_OTHER): Payer: Medicare Other | Admitting: Legal Medicine

## 2021-04-08 ENCOUNTER — Other Ambulatory Visit: Payer: Self-pay

## 2021-04-08 ENCOUNTER — Ambulatory Visit: Payer: Medicare Other | Admitting: Legal Medicine

## 2021-04-08 ENCOUNTER — Encounter: Payer: Self-pay | Admitting: Legal Medicine

## 2021-04-08 VITALS — BP 84/50 | HR 88 | Temp 97.6°F | Resp 18

## 2021-04-08 DIAGNOSIS — F02818 Dementia in other diseases classified elsewhere, unspecified severity, with other behavioral disturbance: Secondary | ICD-10-CM | POA: Diagnosis not present

## 2021-04-08 DIAGNOSIS — E782 Mixed hyperlipidemia: Secondary | ICD-10-CM | POA: Diagnosis not present

## 2021-04-08 DIAGNOSIS — N183 Chronic kidney disease, stage 3 unspecified: Secondary | ICD-10-CM

## 2021-04-08 DIAGNOSIS — J449 Chronic obstructive pulmonary disease, unspecified: Secondary | ICD-10-CM | POA: Diagnosis not present

## 2021-04-08 DIAGNOSIS — I10 Essential (primary) hypertension: Secondary | ICD-10-CM

## 2021-04-08 DIAGNOSIS — Z681 Body mass index (BMI) 19 or less, adult: Secondary | ICD-10-CM | POA: Diagnosis not present

## 2021-04-08 DIAGNOSIS — N400 Enlarged prostate without lower urinary tract symptoms: Secondary | ICD-10-CM

## 2021-04-08 DIAGNOSIS — E440 Moderate protein-calorie malnutrition: Secondary | ICD-10-CM

## 2021-04-08 DIAGNOSIS — I25110 Atherosclerotic heart disease of native coronary artery with unstable angina pectoris: Secondary | ICD-10-CM

## 2021-04-08 NOTE — Progress Notes (Signed)
Subjective:  Patient ID: [name], adult    DOB: 11/04/1928  Age: 88 y.o. MRN: [id number]  Chief Complaint  Patient presents with   Hypertension   Hyperlipidemia    HPI chronic visit  He hit his right hand it is swollen. Patient in rollator.  Patient presents for follow up of hypertension.  Patient tolerating altace well with side effects.  Patient was diagnosed with hypertension 2010 so has been treated for hypertension for 12 years.Patient is working on maintaining diet and exercise regimen and follows up as directed. Complication include .   CORONARY ARTERY DISEASE  Patient presents in follow up of CAD. Patient was diagnosed in 41. The patient has no associated CHF. The patient is currently taking a beta blocker, statin, and aspirin. CAD was diagnosed 35 years ago.  Patient is having no angina. Patient has used no NTG.  Patient is followed by cardiology.  Patient had cabg . Last angiography was 1988, last echocardiogram none.   Dementia with stable memory and sundowning   Current Outpatient Medications on File Prior to Visit  Medication Sig Dispense Refill   acetaminophen (TYLENOL) 500 MG tablet Take 1,000 mg by mouth every 6 (six) hours as needed for mild pain, moderate pain, fever or headache.     albuterol (PROVENTIL) (2.5 MG/3ML) 0.083% nebulizer solution USE 1 VIAL IN NEBULIZER 3 TIMES DAILY 90 mL 11   Artificial Tear Ointment (DRY EYES OP) Place 2 drops into both eyes daily as needed (for dry eyes).     aspirin EC 81 MG tablet Take 81 mg by mouth at bedtime.     famotidine (PEPCID) 20 MG tablet Take 20 mg by mouth 2 (two) times daily.     fenofibrate (TRICOR) 145 MG tablet TAKE ONE TABLET DAILY 90 tablet 1   finasteride (PROSCAR) 5 MG tablet TAKE ONE TABLET DAILY 90 tablet 1   fish oil-omega-3 fatty acids 1000 MG capsule Take 4 g by mouth 2 (two) times daily.     ipratropium (ATROVENT) 0.02 % nebulizer solution USE 1 VIAL IN NEBULIZER 3 TIMES DAILY 90 mL 11    ramipril (ALTACE) 10 MG capsule Take 1 capsule (10 mg total) by mouth daily. 90 capsule 2   No current facility-administered medications on file prior to visit.   Past Medical History:  Diagnosis Date   Arthritis    Chronic kidney disease    kidney stone   COPD (chronic obstructive pulmonary disease) (HCC)    Dementia (HCC)    Hyperlipidemia    Leg weakness    Myalgia due to statin 07/02/2020   Postoperative atrial fibrillation (HCC)    history of    Unspecified dementia with behavioral disturbance    Past Surgical History:  Procedure Laterality Date   CARDIAC CATHETERIZATION  06/2005    EF 50%. severe native CAD. SVG-RCA 50-60 othewise all grafts patent   CERVICAL SPINE SURGERY  Apr 09, 2009   CORONARY ANGIOPLASTY WITH STENT PLACEMENT  11/1997   status post percutaneous coronary intervention and stenting with bare metal stent of the vein graft to the right coronary artery.   CORONARY ARTERY BYPASS GRAFT  1988   [place] with left nternal mammary artery to left anterior descending, vein graft to the obtuse marginal, vein graft to the right coronary artery   CYSTOSCOPY WITH LITHOLAPAXY N/A 11/29/2014   Procedure: CYSTOSCOPY WITH LITHOLAPAXY ;  Surgeon: Festus Aloe, MD;  Location: [facility];  Service: Urology;  Laterality: N/A;  CYSTOSCOPY WITH RETROGRADE PYELOGRAM, URETEROSCOPY AND STENT PLACEMENT Right 11/29/2014   Procedure: RIGHT  RETROGRADE PYELOGRAM, URETEROSCOPY,  AND STENT PLACEMENT;  Surgeon: Festus Aloe, MD;  Location: [facility];  Service: Urology;  Laterality: Right;   CYSTOSCOPY WITH URETEROSCOPY, STONE BASKETRY AND STENT PLACEMENT Right 12/24/2014   Procedure: CYSTOSCOPY WITH RIGHT URETEROSCOPY, WITH BASKETRY EXTRACTION AND STENT PLACEMENT/EXCHANGE;  Surgeon: Festus Aloe, MD;  Location: [facility];  Service: Urology;  Laterality: Right;   EYE SURGERY     cataract surgery   HEMORRHOID BANDING  2003   HOLMIUM LASER APPLICATION Right 07/22/8411   Procedure: HOLMIUM LASER  APPLICATION;  Surgeon: Festus Aloe, MD;  Location: [facility];  Service: Urology;  Laterality: Right;   HOLMIUM LASER APPLICATION Right 24/06/100   Procedure: HOLMIUM LASER LITHROTRIPSY ;  Surgeon: Festus Aloe, MD;  Location: [facility];  Service: Urology;  Laterality: Right;   LEFT HEART CATHETERIZATION WITH CORONARY/GRAFT ANGIOGRAM N/A 06/06/2013   Procedure: LEFT HEART CATHETERIZATION WITH Beatrix Fetters;  Surgeon: Blane Ohara, MD;  Location: [facility];  Service: Cardiovascular;  Laterality: N/A;   PROSTATE SURGERY  march 2009   for stone and prostate trimmed   TRANSURETHRAL RESECTION OF PROSTATE N/A 11/29/2014   Procedure: CYSTO TRANSURETHRAL RESECTION OF THE PROSTATE (TURP);  Surgeon: Festus Aloe, MD;  Location: [facility];  Service: Urology;  Laterality: N/A;    Family History  Problem Relation Age of Onset   Stroke Father    Hyperlipidemia Father    Hypertension Father    Social History   Socioeconomic History   Marital status: Married    Spouse name: Not on file   Number of children: Not on file   Years of education: Not on file   Highest education level: Not on file  Occupational History   Not on file  Tobacco Use   Smoking status: Former    Packs/day: 0.25    Years: 10.00    Pack years: 2.50    Types: Cigarettes    Quit date: 03/23/1963    Years since quitting: 58.0   Smokeless tobacco: Former   Tobacco comments:    Quit in [place] Use: Never used  Substance and Sexual Activity   Alcohol use: No   Drug use: No   Sexual activity: Not on file  Other Topics Concern   Not on file  Social History Narrative   Not on file   Social Determinants of Health   Financial Resource Strain: Not on file  Food Insecurity: Not on file  Transportation Needs: No Transportation Needs   Lack of Transportation (Medical): No   Lack of Transportation (Non-Medical): No  Physical Activity: Not on file  Stress: Not on file  Social Connections:  Not on file    Review of Systems  Constitutional:  Negative for chills, fatigue and fever.  HENT:  Negative for congestion, rhinorrhea and sore throat.   Respiratory:  Negative for cough and shortness of breath.   Cardiovascular:  Negative for chest pain.  Gastrointestinal:  Negative for abdominal pain, constipation, diarrhea, nausea and vomiting.  Genitourinary:  Negative for dysuria and urgency.  Musculoskeletal:  Positive for arthralgias (left shoulder pain). Negative for back pain and myalgias.  Neurological:  Negative for dizziness, weakness, light-headedness and headaches.  Psychiatric/Behavioral:  Negative for dysphoric mood. The patient is not nervous/anxious.     Objective:  BP (!) 84/50    Pulse 88    Temp 97.6 F (36.4 C)  Resp 18   BP/Weight 04/08/2021 10/02/2020 9/67/8938  Systolic BP 84 101 751  Diastolic BP 50 62 64  Wt. (Lbs) - 128 150  BMI - 17.85 20.92    Physical Exam Vitals reviewed.  Constitutional:      General: He is in acute distress.     Appearance: Normal appearance.  HENT:     Right Ear: Tympanic membrane normal.     Left Ear: Tympanic membrane normal.     Nose: Nose normal.     Mouth/Throat:     Mouth: Mucous membranes are moist.     Pharynx: Oropharynx is clear.  Eyes:     Extraocular Movements: Extraocular movements intact.     Conjunctiva/sclera: Conjunctivae normal.     Pupils: Pupils are equal, round, and reactive to light.  Cardiovascular:     Rate and Rhythm: Normal rate and regular rhythm.     Pulses: Normal pulses.     Heart sounds: Normal heart sounds. No murmur heard.   No gallop.  Pulmonary:     Effort: Pulmonary effort is normal. No respiratory distress.     Breath sounds: Normal breath sounds. No wheezing.  Abdominal:     General: Abdomen is flat. Bowel sounds are normal. There is no distension.     Tenderness: There is no abdominal tenderness.  Musculoskeletal:        General: Normal range of motion.     Cervical back:  Normal range of motion.     Right lower leg: No edema.     Left lower leg: No edema.     Comments: Swelling right hand caregiver does not want x-ray since he is using it  Skin:    General: Skin is warm.     Capillary Refill: Capillary refill takes less than 2 seconds.  Neurological:     General: No focal deficit present.     Mental Status: He is alert. Mental status is at baseline.     Comments: rollator        Lab Results  Component Value Date   WBC 4.2 10/02/2020   HGB 15.6 10/02/2020   HCT 46.4 10/02/2020   PLT 192 10/02/2020   GLUCOSE 92 10/02/2020   CHOL 227 (H) 10/02/2020   TRIG 169 (H) 10/02/2020   HDL 36 (L) 10/02/2020   LDLDIRECT 99.5 05/09/2012   LDLCALC 160 (H) 10/02/2020   ALT 10 10/02/2020   AST 13 10/02/2020   NA 141 10/02/2020   K 4.3 10/02/2020   CL 106 10/02/2020   CREATININE 1.35 (H) 10/02/2020   BUN 24 10/02/2020   CO2 21 10/02/2020   TSH 2.380 03/19/2020   INR 1.05 06/05/2013   HGBA1C 5.4 08/20/2009      Assessment & Plan:   Problem List Items Addressed This Visit       Cardiovascular and Mediastinum   Essential hypertension - Primary   Relevant Orders   Comprehensive metabolic panel   CBC with Differential/Platelet An individual hypertension care plan was established and reinforced today.  The patient's status was assessed using clinical findings on exam and labs or diagnostic tests. The patient's success at meeting treatment goals on disease specific evidence-based guidelines and found to be well controlled. SELF MANAGEMENT: The patient and I together assessed ways to personally work towards obtaining the recommended goals. RECOMMENDATIONS: avoid decongestants found in common cold remedies, decrease consumption of alcohol, perform routine monitoring of BP with home BP cuff, exercise, reduction of dietary salt, take medicines as  prescribed, try not to miss doses and quit smoking.  Regular exercise and maintaining a healthy weight is needed.   Stress reduction may help. A CLINICAL SUMMARY including written plan identify barriers to care unique to individual due to social or financial issues.  We attempt to mutually creat solutions for individual and family understanding.    CAD (coronary artery disease) An individual plan was formulated based on patient history and exam, labs and evidence based data. Patient has not had recent angina or nitroglycerin use. continue present treatment.      Respiratory   COPD (chronic obstructive pulmonary disease) ([place]) An individualize plan was formulated for care of COPD.  Treatment is evidence based.  She will continue on inhalers, avoid smoking and smoke.  Regular exercise with help with dyspnea. Routine follow ups and medication compliance is needed.      Nervous and Auditory   Dementia associated with other underlying disease with behavioral disturbance Patient has dementia that is stable, probably vascular from stroke     Genitourinary   Chronic kidney disease AN INDIVIDUAL CARE PLAN was established and reinforced today.  The patient's status was assessed using clinical findings on exam, labs, and other diagnostic testing. Patient's success at meeting treatment goals based on disease specific evidence-bassed guidelines and found to be in fair control. RECOMMENDATIONS include maintaining present medicines and treatment.      Other   Hyperlipidemia   Relevant Orders   Lipid panel AN INDIVIDUAL CARE PLAN for hyperlipidemia/ cholesterol was established and reinforced today.  The patient's status was assessed using clinical findings on exam, lab and other diagnostic tests. The patient's disease status was assessed based on evidence-based guidelines and found to be fair controlled. MEDICATIONS were reviewed. SELF MANAGEMENT GOALS have been discussed and patient's success at attaining the goal of low cholesterol was assessed. RECOMMENDATION given include regular exercise 3 days a week and low  cholesterol/low fat diet. CLINICAL SUMMARY including written plan to identify barriers unique to the patient due to social or economic  reasons was discussed.    Malnutrition of moderate degree (HCC) Supplement nutrition with protein/calorie supplement with meals to improve nutritional status.     Adult BMI <19 kg/sq m Supplement nutrition with protein/calorie supplement with meals to improve nutritional status.    Other Visit Diagnoses     Benign prostatic hyperplasia without lower urinary tract symptoms       Relevant Orders   PSA AN INDIVIDUAL CARE PLAN for BPH was established and reinforced today.  The patient's status was assessed using clinical findings on exam, labs, and other diagnostic testing. Patient's success at meeting treatment goals based on disease specific evidence-bassed guidelines and found to be in good control. RECOMMENDATIONS include maintaining present medicines and treatment.      .    No orders of the defined types were placed in this encounter.   Orders Placed This Encounter  Procedures   Comprehensive metabolic panel   Lipid panel   CBC with Differential/Platelet   PSA     Follow-up: Return in about 3 months (around 07/07/2021) for dementia.  An After Visit Summary was printed and given to the patient.  Reinaldo Meeker, MD [facility] [phone]

## 2021-04-09 LAB — CBC WITH DIFFERENTIAL/PLATELET
Basophils Absolute: 0 10*3/uL (ref 0.0–0.2)
Basos: 0 %
EOS (ABSOLUTE): 0.1 10*3/uL (ref 0.0–0.4)
Eos: 3 %
Hematocrit: 49.1 % (ref 37.5–51.0)
Hemoglobin: 16.6 g/dL (ref 13.0–17.7)
Immature Grans (Abs): 0 10*3/uL (ref 0.0–0.1)
Immature Granulocytes: 1 %
Lymphocytes Absolute: 1.1 10*3/uL (ref 0.7–3.1)
Lymphs: 23 %
MCH: 31.9 pg (ref 26.6–33.0)
MCHC: 33.8 g/dL (ref 31.5–35.7)
MCV: 94 fL (ref 79–97)
Monocytes Absolute: 0.3 10*3/uL (ref 0.1–0.9)
Monocytes: 7 %
Neutrophils Absolute: 3.3 10*3/uL (ref 1.4–7.0)
Neutrophils: 66 %
Platelets: 200 10*3/uL (ref 150–450)
RBC: 5.2 x10E6/uL (ref 4.14–5.80)
RDW: 13.1 % (ref 11.6–15.4)
WBC: 5 10*3/uL (ref 3.4–10.8)

## 2021-04-09 LAB — COMPREHENSIVE METABOLIC PANEL
ALT: 13 IU/L (ref 0–44)
AST: 22 IU/L (ref 0–40)
Albumin/Globulin Ratio: 2 (ref 1.2–2.2)
Albumin: 4.4 g/dL (ref 3.5–4.6)
Alkaline Phosphatase: 48 IU/L (ref 44–121)
BUN/Creatinine Ratio: 20 (ref 10–24)
BUN: 33 mg/dL (ref 10–36)
Bilirubin Total: 0.6 mg/dL (ref 0.0–1.2)
CO2: 20 mmol/L (ref 20–29)
Calcium: 10.4 mg/dL — ABNORMAL HIGH (ref 8.6–10.2)
Chloride: 104 mmol/L (ref 96–106)
Creatinine, Ser: 1.66 mg/dL — ABNORMAL HIGH (ref 0.76–1.27)
Globulin, Total: 2.2 g/dL (ref 1.5–4.5)
Glucose: 92 mg/dL (ref 70–99)
Potassium: 4.2 mmol/L (ref 3.5–5.2)
Sodium: 141 mmol/L (ref 134–144)
Total Protein: 6.6 g/dL (ref 6.0–8.5)
eGFR: 39 mL/min/{1.73_m2} — ABNORMAL LOW (ref >59)

## 2021-04-09 LAB — LIPID PANEL
Chol/HDL Ratio: 5.7 ratio — ABNORMAL HIGH (ref 0.0–5.0)
Cholesterol, Total: 217 mg/dL — ABNORMAL HIGH (ref 100–199)
HDL: 38 mg/dL — ABNORMAL LOW (ref >39)
LDL Chol Calc (NIH): 144 mg/dL — ABNORMAL HIGH (ref 0–99)
Triglycerides: 193 mg/dL — ABNORMAL HIGH (ref 0–149)
VLDL Cholesterol Cal: 35 mg/dL (ref 5–40)

## 2021-04-09 LAB — CARDIOVASCULAR RISK ASSESSMENT

## 2021-04-09 LAB — PSA: Prostate Specific Ag, Serum: 1 ng/mL (ref 0.0–4.0)

## 2021-04-09 NOTE — Progress Notes (Signed)
Creatinine 1.66, eGFR now stage 4, needs to see nephrology, calcium 10.4, liver normal, ldl cholesterol 144, triglycerides 193, cbc normal, lp

## 2021-04-12 ENCOUNTER — Other Ambulatory Visit: Payer: Self-pay | Admitting: Legal Medicine

## 2021-04-12 DIAGNOSIS — R3911 Hesitancy of micturition: Secondary | ICD-10-CM

## 2021-04-12 DIAGNOSIS — N401 Enlarged prostate with lower urinary tract symptoms: Secondary | ICD-10-CM

## 2021-04-17 ENCOUNTER — Other Ambulatory Visit: Payer: Self-pay | Admitting: Legal Medicine

## 2021-04-17 DIAGNOSIS — J449 Chronic obstructive pulmonary disease, unspecified: Secondary | ICD-10-CM

## 2021-05-07 ENCOUNTER — Telehealth: Payer: Self-pay

## 2021-05-07 NOTE — Chronic Care Management (AMB) (Signed)
Chronic Care Management Pharmacy Assistant   Name: [name]  MRN: [id number] DOB: 03/17/1929  Reason for Encounter: Disease State/ Hypertension  Recent office visits:  04-08-2021 Brian Anes, MD. Creatinine= 1.66, eGFR= 39, Calcium= 10.4. Cholesterol= 217, Trig= 193, HDL= 38, LDL= 144, Chol/HDL= 5.7.  Recent consult visits:  None  Hospital visits:  None in previous 6 months  Medications: Outpatient Encounter Medications as of 05/07/2021  Medication Sig   acetaminophen (TYLENOL) 500 MG tablet Take 1,000 mg by mouth every 6 (six) hours as needed for mild pain, moderate pain, fever or headache.   albuterol (PROVENTIL) (2.5 MG/3ML) 0.083% nebulizer solution USE 1 VIAL IN NEBULIZER 3 TIMES DAILY   Artificial Tear Ointment (DRY EYES OP) Place 2 drops into both eyes daily as needed (for dry eyes).   aspirin EC 81 MG tablet Take 81 mg by mouth at bedtime.   famotidine (PEPCID) 20 MG tablet Take 20 mg by mouth 2 (two) times daily.   fenofibrate (TRICOR) 145 MG tablet TAKE ONE TABLET DAILY   finasteride (PROSCAR) 5 MG tablet TAKE ONE TABLET DAILY   fish oil-omega-3 fatty acids 1000 MG capsule Take 4 g by mouth 2 (two) times daily.   ipratropium (ATROVENT) 0.02 % nebulizer solution USE 1 VIAL IN NEBULIZER 3 TIMES DAILY   ramipril (ALTACE) 10 MG capsule Take 1 capsule (10 mg total) by mouth daily.   No facility-administered encounter medications on file as of 05/07/2021.   Recent Office Vitals: BP Readings from Last 3 Encounters:  04/08/21 (!) 84/50  10/02/20 110/62  07/01/20 110/64   Pulse Readings from Last 3 Encounters:  04/08/21 88  10/02/20 60  07/01/20 76    Wt Readings from Last 3 Encounters:  10/02/20 128 lb (58.1 kg)  07/01/20 150 lb (68 kg)  03/19/20 143 lb (64.9 kg)     Kidney Function Lab Results  Component Value Date/Time   CREATININE 1.66 (H) 04/08/2021 07:59 AM   CREATININE 1.35 (H) 10/02/2020 09:07 AM   GFRNONAA 42 (L) 03/19/2020 09:12 AM    GFRAA 48 (L) 03/19/2020 09:12 AM    BMP Latest Ref Rng & Units 04/08/2021 10/02/2020 07/01/2020  Glucose 70 - 99 mg/dL 92 92 88  BUN 10 - 36 mg/dL 33 24 26  Creatinine 0.76 - 1.27 mg/dL 1.66(H) 1.35(H) 1.63(H)  BUN/Creat Ratio 10 - $Re'24 20 18 16  'uza$ Sodium 134 - 144 mmol/L 141 141 142  Potassium 3.5 - 5.2 mmol/L 4.2 4.3 3.9  Chloride 96 - 106 mmol/L 104 106 101  CO2 20 - 29 mmol/L 20 21 19(L)  Calcium 8.6 - 10.2 mg/dL 10.4(H) 9.9 9.6     Current antihypertensive regimen:  Ramipril 10 mg daily  Patient's wife verbally confirms he is taking the above medications as directed. Yes  How often are you checking your Blood Pressure? daily  he checks his blood pressure in the morning before taking his medication.  Current home BP readings: 112/68 70, 110/60 68 Wrist or arm cuff: cuff Caffeine intake: Patient wife states patient has cut back on caffeine due to increased agitation Salt intake: Limited OTC medications including pseudoephedrine or NSAIDs? Fish oil  Any readings above 180/120? No  What recent interventions/DTPs have been made by any provider to improve Blood Pressure control since last CPP Visit:  -Educated on BP goals and benefits of medications for prevention of heart attack, stroke and kidney damage; -Counseled to monitor BP at home Weekly, document, and provide log at  future appointments  Any recent hospitalizations or ED visits since last visit with CPP? No  What diet changes have been made to improve Blood Pressure Control?  Patient's wife states she doesn't cook with salt but she allows him to eat what he can since he has dementia.  What exercise is being done to improve your Blood Pressure Control?  Patient's wife states patient isn't able to do any exercise but does walk around with a cane.  Adherence Review: Is the patient currently on ACE/ARB medication? Yes Does the patient have >5 day gap between last estimated fill dates? CPP to review  Care Gaps: Last annual  wellness visit? Patient's wife states patient hasn't had an AWV in a while do to health declining and dementia. Dr. Henrene Pastor is aware. Tdap overdue Shingrix overdue Covid vaccine overdue Flu vaccine overdue last completed 02-22-2020 Dexa scan overdue  Star Rating Drugs: Ramipril 10 mg- Last filled 02-25-2021 90 DS  [place] Clinical Pharmacist Assistant [phone]

## 2021-05-11 ENCOUNTER — Telehealth: Payer: Self-pay

## 2021-05-11 NOTE — Telephone Encounter (Signed)
Send in request for new nebulizer  with [facility]

## 2021-05-11 NOTE — Telephone Encounter (Signed)
Wife calling as patient's nebulizer has quit working. She did reach out to [facility] and they recommended she contact us for new script.   She is requesting new neb script be sent to [place] [facility].   Harrell Lark 05/11/21 3:51 PM

## 2021-05-17 ENCOUNTER — Other Ambulatory Visit: Payer: Self-pay | Admitting: Legal Medicine

## 2021-05-17 DIAGNOSIS — E782 Mixed hyperlipidemia: Secondary | ICD-10-CM

## 2021-06-01 ENCOUNTER — Telehealth: Payer: Self-pay

## 2021-06-01 NOTE — Progress Notes (Signed)
Pt confirmed appt with CPP on 3/15 ? ?Elray Mcgregor, CMA ?Clinical Pharmacist Assistant  ?[phone]  ?

## 2021-06-03 ENCOUNTER — Ambulatory Visit (INDEPENDENT_AMBULATORY_CARE_PROVIDER_SITE_OTHER): Payer: Medicare Other

## 2021-06-03 ENCOUNTER — Other Ambulatory Visit: Payer: Self-pay

## 2021-06-03 DIAGNOSIS — I10 Essential (primary) hypertension: Secondary | ICD-10-CM

## 2021-06-03 DIAGNOSIS — E782 Mixed hyperlipidemia: Secondary | ICD-10-CM

## 2021-06-03 NOTE — Progress Notes (Signed)
? ?Chronic Care Management ?Pharmacy Note ? ?06/03/2021 ?Name:  [name] MRN:  [id number] DOB:  1928-04-09 ? ?Summary: ?-Due to dementia, patient doesn't talk to anyone other than his wife/daughter. Because of this, primarily coordinated care with wife, [name]. She is a former Marine scientist and has taken care of him for 8 years (She retired 9 years ago to spend time together, only had 1 year before his health got bad and she had to become a care-giver).  ? ?Recommendations/Changes made from today's visit: ?-N/A ? ? ? ?Subjective: ?[name] is an 88 y.o. year old adult who is a primary patient of Henrene Pastor, Zeb Comfort, MD.  The CCM team was consulted for assistance with disease management and care coordination needs.   ? ?Engaged with patient by telephone for follow up visit in response to provider referral for pharmacy case management and/or care coordination services.  ? ?Consent to Services:  ?The patient was given the following information about Chronic Care Management services today, agreed to services, and gave verbal consent: 1. CCM service includes personalized support from designated clinical staff supervised by the primary care provider, including individualized plan of care and coordination with other care providers 2. 24/7 contact phone numbers for assistance for urgent and routine care needs. 3. Service will only be billed when office clinical staff spend 20 minutes or more in a month to coordinate care. 4. Only one practitioner may furnish and bill the service in a calendar month. 5.The patient may stop CCM services at any time (effective at the end of the month) by phone call to the office staff. 6. The patient will be responsible for cost sharing (co-pay) of up to 20% of the service fee (after annual deductible is met). Patient agreed to services and consent obtained. ? ?Patient Care Team: ?[name], MD as PCP - General (Family Medicine) ?Lelon Perla, MD as PCP - Cardiology  (Cardiology) ?Lowella Bandy, MD (Inactive) as Consulting Physician (Urology) ?Lane Hacker, [facility] (Pharmacist) ? ?Recent office visits:  ?04-08-2021 [name], MD. Creatinine= 1.66, eGFR= 39, Calcium= 10.4. Cholesterol= 217, Trig= 193, HDL= 38, LDL= 144, Chol/HDL= 5.7. ?  ?Recent consult visits:  ?None ?  ?Hospital visits:  ?None in previous 6 months ? ? ?Objective: ? ?Lab Results  ?Component Value Date  ? CREATININE 1.66 (H) 04/08/2021  ? BUN 33 04/08/2021  ? GFRNONAA 42 (L) 03/19/2020  ? GFRAA 48 (L) 03/19/2020  ? NA 141 04/08/2021  ? K 4.2 04/08/2021  ? CALCIUM 10.4 (H) 04/08/2021  ? CO2 20 04/08/2021  ? GLUCOSE 92 04/08/2021  ? ? ?Lab Results  ?Component Value Date/Time  ? HGBA1C 5.4 08/20/2009 08:15 AM  ?  ?Last diabetic Eye exam: No results found for: HMDIABEYEEXA  ?Last diabetic Foot exam: No results found for: HMDIABFOOTEX  ? ?Lab Results  ?Component Value Date  ? CHOL 217 (H) 04/08/2021  ? HDL 38 (L) 04/08/2021  ? LDLCALC 144 (H) 04/08/2021  ? LDLDIRECT 99.5 05/09/2012  ? TRIG 193 (H) 04/08/2021  ? CHOLHDL 5.7 (H) 04/08/2021  ? ? ?Hepatic Function Latest Ref Rng & Units 04/08/2021 10/02/2020 07/01/2020  ?Total Protein 6.0 - 8.5 g/dL 6.6 6.5 6.8  ?Albumin 3.5 - 4.6 g/dL 4.4 4.6 4.5  ?AST 0 - 40 IU/L $Remov'22 13 21  'GDPPJg$ ?ALT 0 - 44 IU/L $Remov'13 10 15  'QQXgJu$ ?Alk Phosphatase 44 - 121 IU/L 48 37(L) 40(L)  ?Total Bilirubin 0.0 - 1.2 mg/dL 0.6 0.5 0.7  ?Bilirubin, Direct  0.0 - 0.3 mg/dL - - -  ? ? ?Lab Results  ?Component Value Date/Time  ? TSH 2.380 03/19/2020 09:12 AM  ? TSH 0.668 06/05/2013 09:47 PM  ? ? ?CBC Latest Ref Rng & Units 04/08/2021 10/02/2020 07/01/2020  ?WBC 3.4 - 10.8 x10E3/uL 5.0 4.2 5.1  ?Hemoglobin 13.0 - 17.7 g/dL 16.6 15.6 16.1  ?Hematocrit 37.5 - 51.0 % 49.1 46.4 48.4  ?Platelets 150 - 450 x10E3/uL 200 192 199  ? ? ?No results found for: VD25OH ? ?Clinical ASCVD: Yes  ?The ASCVD Risk score (Arnett DK, et al., 2019) failed to calculate for the following reasons: ?  The 2019 ASCVD risk score is only valid  for ages 56 to 19   ? ?Depression screen [facility] 2/9 10/02/2020 09/12/2019  ?Decreased Interest 0 0  ?Down, Depressed, Hopeless 0 0  ?PHQ - 2 Score 0 0  ?  ? ?Other: (CHADS2VASc if Afib, MMRC or CAT for COPD, ACT, DEXA) ? ?Social History  ? ?Tobacco Use  ?Smoking Status Former  ? Packs/day: 0.25  ? Years: 10.00  ? Pack years: 2.50  ? Types: Cigarettes  ? Quit date: 03/23/1963  ? Years since quitting: 58.2  ?Smokeless Tobacco Former  ?Tobacco Comments  ? Quit in 1965  ? ?BP Readings from Last 3 Encounters:  ?04/08/21 (!) 84/50  ?10/02/20 110/62  ?07/01/20 110/64  ? ?Pulse Readings from Last 3 Encounters:  ?04/08/21 88  ?10/02/20 60  ?07/01/20 76  ? ?Wt Readings from Last 3 Encounters:  ?10/02/20 128 lb (58.1 kg)  ?07/01/20 150 lb (68 kg)  ?03/19/20 143 lb (64.9 kg)  ? ?BMI Readings from Last 3 Encounters:  ?10/02/20 17.85 kg/m?  ?07/01/20 20.92 kg/m?  ?03/19/20 19.94 kg/m?  ? ? ?Assessment/Interventions: Review of patient past medical history, allergies, medications, health status, including review of consultants reports, laboratory and other test data, was performed as part of comprehensive evaluation and provision of chronic care management services.  ? ?SDOH:  (Social Determinants of Health) assessments and interventions performed: Yes ? ? ?SDOH Screenings  ? ?Alcohol Screen: Not on file  ?Depression (PHQ2-9): Low Risk   ? PHQ-2 Score: 0  ?Financial Resource Strain: Not on file  ?Food Insecurity: Not on file  ?Housing: Not on file  ?Physical Activity: Not on file  ?Social Connections: Not on file  ?Stress: Not on file  ?Tobacco Use: Medium Risk  ? Smoking Tobacco Use: Former  ? Smokeless Tobacco Use: Former  ? Passive Exposure: Not on file  ?Transportation Needs: No Transportation Needs  ? Lack of Transportation (Medical): No  ? Lack of Transportation (Non-Medical): No  ? ? ?CCM Care Plan ? ?Allergies  ?Allergen Reactions  ? Penicillins Hives  ?  Has patient had a PCN reaction causing immediate rash, facial/tongue/throat  swelling, SOB or lightheadedness with hypotension: No ?Has patient had a PCN reaction causing severe rash involving mucus membranes or skin necrosis: No ?Has patient had a PCN reaction that required hospitalization: No ?Has patient had a PCN reaction occurring within the last 10 years: No ?If all of the above answers are "NO", then may proceed with Cephalosporin use. ?  ? Statins Other (See Comments)  ?  myalgias  ? ? ?Medications Reviewed Today   ? ? Reviewed by Lane Hacker, [facility] (Pharmacist) on 06/03/21 at 1153  Med List Status: <None>  ? ?Medication Order Taking? Sig Documenting Provider Last Dose Status Informant  ?acetaminophen (TYLENOL) 500 MG tablet [id number] No Take 1,000 mg by mouth  every 6 (six) hours as needed for mild pain, moderate pain, fever or headache. [provider] Taking Active Spouse/Significant Other  ?albuterol (PROVENTIL) (2.5 MG/3ML) 0.083% nebulizer solution [id number]  USE 1 VIAL IN NEBULIZER 3 TIMES DAILY [name], MD  Active   ?Artificial Tear Ointment (DRY EYES OP) [id number] No Place 2 drops into both eyes daily as needed (for dry eyes). [provider] Taking Active Spouse/Significant Other  ?aspirin EC 81 MG tablet [id number] No Take 81 mg by mouth at bedtime. [provider] Taking Active Spouse/Significant Other  ?         ?Med Note (MATHIS, Caryn Section   Tue Nov 20, 2019  8:52 AM)    ?famotidine (PEPCID) 20 MG tablet [id number] No Take 20 mg by mouth 2 (two) times daily. [provider] Taking Active   ?fenofibrate (TRICOR) 145 MG tablet [id number]  TAKE ONE TABLET DAILY [name], MD  Active   ?finasteride (PROSCAR) 5 MG tablet [id number]  TAKE ONE TABLET DAILY [name], MD  Active   ?fish oil-omega-3 fatty acids 1000 MG capsule [id number] No Take 4 g by mouth 2 (two) times daily. [provider] Taking Active Spouse/Significant Other  ?         ?Med Note (MATHIS, Caryn Section   Tue Nov 20, 2019  8:53 AM)     ?ipratropium (ATROVENT) 0.02 % nebulizer solution [id number]  USE 1 VIAL IN NEBULIZER 3 TIMES DAILY [name], MD  Active   ?ramipril (ALTACE) 10 MG capsule [id number]  Take 1 capsule (10 mg tot

## 2021-06-03 NOTE — Patient Instructions (Signed)
Visit Information ? ? Goals Addressed   ?None ?  ? ?Patient Care Plan: [place]  ?  ? ?Problem Identified: HTN, Cholesterol, Dementia   ?Priority: High  ?Onset Date: 12/04/2020  ?  ? ?Long-Range Goal: Disease State Management   ?Start Date: 12/04/2020  ?Expected End Date: 12/04/2021  ?Recent Progress: On track  ?Priority: High  ?Note:   ?Current Barriers:  ?Does not maintain contact with provider office ? ?Pharmacist Clinical Goal(s):  ?Patient will contact provider office for questions/concerns as evidenced notation of same in electronic health record through collaboration with PharmD and provider.  ? ?Interventions: ?1:1 collaboration with [name], MD regarding development and update of comprehensive plan of care as evidenced by provider attestation and co-signature ?Inter-disciplinary care team collaboration (see longitudinal plan of care) ?Comprehensive medication review performed; medication list updated in electronic medical record ? ?Hypertension (BP goal <140/90) ?BP Readings from Last 3 Encounters:  ?04/08/21 (!) 84/50  ?10/02/20 110/62  ?07/01/20 110/64  ?-Controlled ?-Current treatment: ?Ramapril '10mg'$  QD Appropriate, Effective, Safe, Accessible ?-Medications previously tried: Metoprolol  ?-Current home readings:  ?September 2022: 110-120/60-70 (Wife is former Marine scientist who takes BP) ?March 2023: 110/60-116/67 ?-Current dietary habits: Wife states, "He's 81 years old and has dementia so doesn't understand if I tell him he can't eat a cookie so I just let him have it ?-Current exercise habits: None due to age and status ?-Denies hypotensive/hypertensive symptoms ?-Educated on BP goals and benefits of medications for prevention of heart attack, stroke and kidney damage; ?-Counseled to monitor BP at home Weekly, document, and provide log at future appointments ?-Recommended to continue current medication ? ?CAD: (LDL goal < 100) ?Lab Results  ?Component Value Date  ? CHOL 217 (H)  04/08/2021  ? CHOL 227 (H) 10/02/2020  ? CHOL 227 (H) 07/01/2020  ? ?Lab Results  ?Component Value Date  ? HDL 38 (L) 04/08/2021  ? HDL 36 (L) 10/02/2020  ? HDL 32 (L) 07/01/2020  ? ?Lab Results  ?Component Value Date  ? LDLCALC 144 (H) 04/08/2021  ? LDLCALC 160 (H) 10/02/2020  ? LDLCALC 161 (H) 07/01/2020  ? ?Lab Results  ?Component Value Date  ? TRIG 193 (H) 04/08/2021  ? TRIG 169 (H) 10/02/2020  ? TRIG 181 (H) 07/01/2020  ? ?Lab Results  ?Component Value Date  ? CHOLHDL 5.7 (H) 04/08/2021  ? CHOLHDL 6.3 (H) 10/02/2020  ? CHOLHDL 7.1 (H) 07/01/2020  ? ?Lab Results  ?Component Value Date  ? LDLDIRECT 99.5 05/09/2012  ? LDLDIRECT 61.0 04/12/2011  ? LDLDIRECT 130.8 01/11/2011  ?-Not ideally controlled ? *Per wife, patient has had high Trig for whole life but other cholesterol was normal until he had HA in the 80's, then all his levels became bad ?-Current treatment: ?Fenofibrate '145mg'$  Appropriate, Effective, Safe, Accessible ?ASA '81mg'$  Appropriate, Effective, Safe, Accessible ?-Medications previously tried: All statins (Does have ICD code in profile) ?-Current dietary habits: Wife states, "He's 80 years old and has dementia so doesn't understand if I tell him he can't eat a cookie so I just let him have it ?-Current exercise habits: None due to age and status ?-Educated on Cholesterol goals;  ?-Recommended to continue current medication ? ?GERD (Goal: minimize symptoms of reflux ) ?-Controlled ?-Current treatment  ?Famotidine '20mg'$  BID (Patient taking QD) Appropriate, Effective, Safe, Accessible ?-Medications previously tried: none reported  ?-Triggering factors: Spicy food ?-Hx of Bleeds/ulcers: No ?-Counseled on small meals, elevating head, and sleeping on left side ?-Recommended to continue current  medication ? ? ? ?Patient Goals/Self-Care Activities ?Patient will:  ?- take medications as prescribed ? ?Follow Up Plan: The patient has been provided with contact information for the care management team and has been  advised to call with any health related questions or concerns.  ? ?CPP F/U Sept 2023 ? ?[place] Constable, Pharm.D. - [phone] ? ? ?  ? ? ?Mr. Shorten was given information about Chronic Care Management services today including:  ?CCM service includes personalized support from designated clinical staff supervised by his physician, including individualized plan of care and coordination with other care providers ?24/7 contact phone numbers for assistance for urgent and routine care needs. ?Standard insurance, coinsurance, copays and deductibles apply for chronic care management only during months in which we provide at least 20 minutes of these services. Most insurances cover these services at 100%, however patients may be responsible for any copay, coinsurance and/or deductible if applicable. This service may help you avoid the need for more expensive face-to-face services. ?Only one practitioner may furnish and bill the service in a calendar month. ?The patient may stop CCM services at any time (effective at the end of the month) by phone call to the office staff. ? ?Patient agreed to services and verbal consent obtained.  ? ?The patient verbalized understanding of instructions, educational materials, and care plan provided today and declined offer to receive copy of patient instructions, educational materials, and care plan.  ?The pharmacy team will reach out to the patient again over the next 60 days.  ? ?Lane Hacker, [place] ? ?

## 2021-06-19 DIAGNOSIS — I10 Essential (primary) hypertension: Secondary | ICD-10-CM

## 2021-06-19 DIAGNOSIS — E782 Mixed hyperlipidemia: Secondary | ICD-10-CM

## 2021-07-08 ENCOUNTER — Ambulatory Visit: Payer: Medicare Other | Admitting: Legal Medicine

## 2021-07-28 ENCOUNTER — Telehealth: Payer: Self-pay

## 2021-07-28 NOTE — Chronic Care Management (AMB) (Signed)
? ? ?Chronic Care Management ?Pharmacy Assistant  ? ?Name: [name]  MRN: [id number] DOB: March 08, 1929 ? ?Reason for Encounter: Disease State/ General ?  ?Recent office visits:  ?None ? ?Recent consult visits:  ?None ? ?Hospital visits:  ?None in previous 6 months ? ?Medications: ?Outpatient Encounter Medications as of 07/28/2021  ?Medication Sig Note  ? acetaminophen (TYLENOL) 500 MG tablet Take 1,000 mg by mouth every 6 (six) hours as needed for mild pain, moderate pain, fever or headache.   ? albuterol (PROVENTIL) (2.5 MG/3ML) 0.083% nebulizer solution USE 1 VIAL IN NEBULIZER 3 TIMES DAILY   ? Artificial Tear Ointment (DRY EYES OP) Place 2 drops into both eyes daily as needed (for dry eyes).   ? aspirin EC 81 MG tablet Take 81 mg by mouth at bedtime.   ? famotidine (PEPCID) 20 MG tablet Take 20 mg by mouth 2 (two) times daily. 06/03/2021: Patient taking QD now  ? fenofibrate (TRICOR) 145 MG tablet TAKE ONE TABLET DAILY   ? finasteride (PROSCAR) 5 MG tablet TAKE ONE TABLET DAILY   ? fish oil-omega-3 fatty acids 1000 MG capsule Take 4 g by mouth 2 (two) times daily.   ? ipratropium (ATROVENT) 0.02 % nebulizer solution USE 1 VIAL IN NEBULIZER 3 TIMES DAILY   ? ramipril (ALTACE) 10 MG capsule Take 1 capsule (10 mg total) by mouth daily.   ? ?No facility-administered encounter medications on file as of 07/28/2021.  ?[place] for General Review Call ? ? ?Chart Review: ? ?Have there been any documented new, changed, or discontinued medications since last visit? No ?Has there been any documented recent hospitalizations or ED visits since last visit with Clinical Pharmacist? No ?Brief Summary : None ? ? ?Adherence Review: ? ?Does the Clinical Pharmacist Assistant have access to adherence rates? No ?Adherence rates for STAR metric medications: Not available ?Adherence rates for medications indicated for disease state being reviewed: Not available ?Does the patient have >5 day gap between last estimated fill  dates for any of the above medications or other medication gaps? No ?Reason for medication gaps: No gaps ? ? ?Disease State Questions: ? ?Able to connect with Patient? Yes spoke with patient's wife. ? ?Did patient have any problems with their health recently? No ? ?Have you had any admissions or emergency room visits or worsening of your condition(s) since last visit? No ? ?Have you had any visits with new specialists or providers since your last visit? No ? ?Have you had any new health care problem(s) since your last visit? No ? ?Have you run out of any of your medications since you last spoke with clinical pharmacist? No ? ?Are there any medications you are not taking as prescribed? No ? ?Are you having any issues or side effects with your medications? No ? ?Do you have any other health concerns or questions you want to discuss with your Clinical Pharmacist before your next visit? No ? ?Are there any health concerns that you feel we can do a better job addressing? No ? ?Are you having any problems with any of the following since the last visit: (select all that apply) ? None ? ?12. Any falls since last visit? No ?  ?13. Any increased or uncontrolled pain since last visit? No ? ?14. Next visit Type: office ?      Visit with: PCP ?       Date: 09-16-2021 ?       Time: 8:00 ? ?15. Additional  Details? No  ? ? ?Care Gaps: ?Last annual wellness visit? Patient's wife states patient hasn't had an AWV in a while do to health declining and dementia. Dr. Henrene Pastor is aware. ?Tdap overdue ?Shingrix overdue ?Covid vaccine overdue ?Dexa scan overdue ? ?Star Rating Drugs: ?Ramipril 10 mg- Last filled 06-16-2021 90 DS [facility] ? ?Malecca Hicks CMA ?Clinical Pharmacist Assistant ?[phone] ? ?

## 2021-09-16 ENCOUNTER — Ambulatory Visit: Payer: Medicare Other | Admitting: Legal Medicine

## 2021-10-05 ENCOUNTER — Telehealth: Payer: Self-pay

## 2021-10-05 NOTE — Chronic Care Management (AMB) (Signed)
Chronic Care Management Pharmacy Assistant   Name: [name]  MRN: [id number] DOB: April 26, 1928  Reason for Encounter: Disease State/ General  Recent office visits:  None  Recent consult visits:  None  Hospital visits:  None in previous 6 months  Medications: Outpatient Encounter Medications as of 10/05/2021  Medication Sig Note   acetaminophen (TYLENOL) 500 MG tablet Take 1,000 mg by mouth every 6 (six) hours as needed for mild pain, moderate pain, fever or headache.    albuterol (PROVENTIL) (2.5 MG/3ML) 0.083% nebulizer solution USE 1 VIAL IN NEBULIZER 3 TIMES DAILY    Artificial Tear Ointment (DRY EYES OP) Place 2 drops into both eyes daily as needed (for dry eyes).    aspirin EC 81 MG tablet Take 81 mg by mouth at bedtime.    famotidine (PEPCID) 20 MG tablet Take 20 mg by mouth 2 (two) times daily. 06/03/2021: Patient taking QD now   fenofibrate (TRICOR) 145 MG tablet TAKE ONE TABLET DAILY    finasteride (PROSCAR) 5 MG tablet TAKE ONE TABLET DAILY    fish oil-omega-3 fatty acids 1000 MG capsule Take 4 g by mouth 2 (two) times daily.    ipratropium (ATROVENT) 0.02 % nebulizer solution USE 1 VIAL IN NEBULIZER 3 TIMES DAILY    ramipril (ALTACE) 10 MG capsule Take 1 capsule (10 mg total) by mouth daily.    No facility-administered encounter medications on file as of 10/05/2021.   [place] for General Review Call   Chart Review:  Have there been any documented new, changed, or discontinued medications since last visit? No  Has there been any documented recent hospitalizations or ED visits since last visit with Clinical Pharmacist? No Brief Summary: None  Adherence Review:  Does the Clinical Pharmacist Assistant have access to adherence rates? Yes Adherence rates for STAR metric medications (Refer to star drugs below). Adherence rates for medications indicated for disease state being reviewed (N/A). Does the patient have >5 day gap between last estimated  fill dates for any of the above medications or other medication gaps? No Reason for medication gap: None   Disease State Questions:  Able to connect with Patient? Yes  Did patient have any problems with their health recently? No  Have you had any admissions or emergency room visits or worsening of your condition(s) since last visit? No  Have you had any visits with new specialists or providers since your last visit? No  Have you had any new health care problem(s) since your last visit? No  Have you run out of any of your medications since you last spoke with clinical pharmacist? No  Are there any medications you are not taking as prescribed? No  Are you having any issues or side effects with your medications? No  Do you have any other health concerns or questions you want to discuss with your Clinical Pharmacist before your next visit? No  Are there any health concerns that you feel we can do a better job addressing? Yes: Patient's wife reports dementia getting worse.  Are you having any problems with any of the following since the last visit: (select all that apply)  None  12. Any falls since last visit? No  13. Any increased or uncontrolled pain since last visit? No  14. Next visit Type: telephone       Visit with: Arizona Constable        Date: 12-02-2021        Time: 11:00  15. Additional Details? No   Care Gaps: Tdap overdue Shingrix overdue Covid vaccine overdue Dexa scan overdue  Star Rating Drugs: Ramipril 10 mg- Last filled 06-16-2021 90 DS [facility] pharmacy stated last picked up on 09-18-2021 30 DS). Previous filled 03-12-2021 90 DS  [facility] Clinical Pharmacist Assistant [phone]

## 2021-10-13 ENCOUNTER — Ambulatory Visit: Payer: Medicare Other | Admitting: Legal Medicine

## 2021-11-18 ENCOUNTER — Other Ambulatory Visit: Payer: Self-pay

## 2021-11-18 MED ORDER — RAMIPRIL 10 MG PO CAPS: 10.0000 mg | ORAL_CAPSULE | Freq: Every day | ORAL | 1 refills | Status: DC

## 2021-12-02 ENCOUNTER — Ambulatory Visit (INDEPENDENT_AMBULATORY_CARE_PROVIDER_SITE_OTHER): Payer: Medicare Other

## 2021-12-02 DIAGNOSIS — E782 Mixed hyperlipidemia: Secondary | ICD-10-CM

## 2021-12-02 DIAGNOSIS — I10 Essential (primary) hypertension: Secondary | ICD-10-CM

## 2021-12-02 DIAGNOSIS — F02818 Dementia in other diseases classified elsewhere, unspecified severity, with other behavioral disturbance: Secondary | ICD-10-CM

## 2021-12-02 NOTE — Progress Notes (Signed)
Chronic Care Management Pharmacy Note  12/02/2021 Name:  CAMERYN CHRISLEY MRN:  [id number] DOB:  20-Jul-1928  Summary: -Due to dementia, patient doesn't talk to anyone other than his wife/daughter. Because of this, primarily coordinated care with wife, Inez Catalina. She is a former [employer] scientist and has taken care of him for 8 years (She retired 9 years ago to spend time together, only had 1 year before his health got bad and she had to become a care-giver).   Recommendations/Changes made from today's visit: -Sent direct msg to Dr. Tobie Poet, "haven't been seen since Jan. They tried to have a visit in the summer but she couldn't get him in the car and he became agitated. She asked to have a phone appt so she could get advice on how to be seen a little easier. Dr. Henrene Pastor said no and they haven't been able to be seen since. She's not asking for only phone visits, she's just asking for help on how to be seen in person" Will defer to Dr. Tobie Poet as to how to proceed -Recommend Dc'ing many medications. Dementia is getting much worse and there isn't necessarily a benefit to them  anymore. Recommend discussing meds with patient wife at in-person f/u visit    Subjective: DAVIUS GOUDEAU is an 88 y.o. year old adult who is a primary patient of Henrene Pastor, Zeb Comfort, MD.  The CCM team was consulted for assistance with disease management and care coordination needs.    Engaged with patient by telephone for follow up visit in response to provider referral for pharmacy case management and/or care coordination services.   Consent to Services:  The patient was given the following information about Chronic Care Management services today, agreed to services, and gave verbal consent: 1. CCM service includes personalized support from designated clinical staff supervised by the primary care provider, including individualized plan of care and coordination with other care providers 2. 24/7 contact phone numbers for assistance for urgent and  routine care needs. 3. Service will only be billed when office clinical staff spend 20 minutes or more in a month to coordinate care. 4. Only one practitioner may furnish and bill the service in a calendar month. 5.The patient may stop CCM services at any time (effective at the end of the month) by phone call to the office staff. 6. The patient will be responsible for cost sharing (co-pay) of up to 20% of the service fee (after annual deductible is met). Patient agreed to services and consent obtained.  Patient Care Team: [name], MD as PCP - General (Family Medicine) Stanford Breed Denice Bors, MD as PCP - Cardiology (Cardiology) Lowella Bandy, MD (Inactive) as Consulting Physician (Urology) Lane Hacker, [facility] (Pharmacist)  Recent office visits:  04-08-2021 [name], MD. Creatinine= 1.66, eGFR= 39, Calcium= 10.4. Cholesterol= 217, Trig= 193, HDL= 38, LDL= 144, Chol/HDL= 5.7.   Recent consult visits:  None   Hospital visits:  None in previous 6 months   Objective:  Lab Results  Component Value Date   CREATININE 1.66 (H) 04/08/2021   BUN 33 04/08/2021   GFRNONAA 42 (L) 03/19/2020   GFRAA 48 (L) 03/19/2020   NA 141 04/08/2021   K 4.2 04/08/2021   CALCIUM 10.4 (H) 04/08/2021   CO2 20 04/08/2021   GLUCOSE 92 04/08/2021    Lab Results  Component Value Date/Time   HGBA1C 5.4 08/20/2009 08:15 AM    Last diabetic Eye exam: No results found for: "HMDIABEYEEXA"  Last diabetic Foot  exam: No results found for: "HMDIABFOOTEX"   Lab Results  Component Value Date   CHOL 217 (H) 04/08/2021   HDL 38 (L) 04/08/2021   LDLCALC 144 (H) 04/08/2021   LDLDIRECT 99.5 05/09/2012   TRIG 193 (H) 04/08/2021   CHOLHDL 5.7 (H) 04/08/2021       Latest Ref Rng & Units 04/08/2021    7:59 AM 10/02/2020    9:07 AM 07/01/2020    9:07 AM  Hepatic Function  Total Protein 6.0 - 8.5 g/dL 6.6  6.5  6.8   Albumin 3.5 - 4.6 g/dL 4.4  4.6  4.5   AST 0 - 40 IU/L _0 ALT 0 - 44  IU/L _1 Alk Phosphatase 44 - 121 IU/L 48  37  40   Total Bilirubin 0.0 - 1.2 mg/dL 0.6  0.5  0.7     Lab Results  Component Value Date/Time   TSH 2.380 03/19/2020 09:12 AM   TSH 0.668 06/05/2013 09:47 PM       Latest Ref Rng & Units 04/08/2021    7:59 AM 10/02/2020    9:07 AM 07/01/2020    9:07 AM  CBC  WBC 3.4 - 10.8 x10E3/uL 5.0  4.2  5.1   Hemoglobin 13.0 - 17.7 g/dL 16.6  15.6  16.1   Hematocrit 37.5 - 51.0 % 49.1  46.4  48.4   Platelets 150 - 450 x10E3/uL 200  192  199     No results found for: "VD25OH"  Clinical ASCVD: Yes  The ASCVD Risk score (Arnett DK, et al., 2019) failed to calculate for the following reasons:   The 2019 ASCVD risk score is only valid for ages 40 to 66       10/02/2020    8:31 AM 09/12/2019    7:52 AM  Depression screen PHQ 2/9  Decreased Interest 0 0  Down, Depressed, Hopeless 0 0  PHQ - 2 Score 0 0     Other: (CHADS2VASc if Afib, MMRC or CAT for COPD, ACT, DEXA)  Social History   Tobacco Use  Smoking Status Former   Packs/day: 0.25   Years: 10.00   Total pack years: 2.50   Types: Cigarettes   Quit date: 03/23/1963   Years since quitting: 58.7  Smokeless Tobacco Former  Tobacco Comments   Quit in 1965   BP Readings from Last 3 Encounters:  04/08/21 (!) 84/50  10/02/20 110/62  07/01/20 110/64   Pulse Readings from Last 3 Encounters:  04/08/21 88  10/02/20 60  07/01/20 76   Wt Readings from Last 3 Encounters:  10/02/20 128 lb (58.1 kg)  07/01/20 150 lb (68 kg)  03/19/20 143 lb (64.9 kg)   BMI Readings from Last 3 Encounters:  10/02/20 17.85 kg/m  07/01/20 20.92 kg/m  03/19/20 19.94 kg/m    Assessment/Interventions: Review of patient past medical history, allergies, medications, health status, including review of consultants reports, laboratory and other test data, was performed as part of comprehensive evaluation and provision of chronic care management services.   SDOH:  (Social Determinants of Health)  assessments and interventions performed: Yes SDOH Interventions    Flowsheet Row Chronic Care Management from 12/02/2021 in [place] Management from 12/04/2020 in [place] Interventions    Transportation Interventions Intervention Not Indicated Intervention Not Indicated  Financial Strain Interventions Intervention Not Indicated --       SDOH Screenings  Transportation Needs: No Transportation Needs (12/02/2021)  Depression (PHQ2-9): Low Risk  (10/02/2020)  Financial Resource Strain: Low Risk  (12/02/2021)  Tobacco Use: Medium Risk (04/08/2021)    CCM Care Plan  Allergies  Allergen Reactions   Penicillins Hives    Has patient had a PCN reaction causing immediate rash, facial/tongue/throat swelling, SOB or lightheadedness with hypotension: No Has patient had a PCN reaction causing severe rash involving mucus membranes or skin necrosis: No Has patient had a PCN reaction that required hospitalization: No Has patient had a PCN reaction occurring within the last 10 years: No If all of the above answers are "NO", then may proceed with Cephalosporin use.    Statins Other (See Comments)    myalgias    Medications Reviewed Today     Reviewed by Lane Hacker, [facility] (Pharmacist) on 12/02/21 at [place] List Status: <None>   Medication Order Taking? Sig Documenting Provider Last Dose Status Informant  acetaminophen (TYLENOL) 500 MG tablet [id number] No Take 1,000 mg by mouth every 6 (six) hours as needed for mild pain, moderate pain, fever or headache. [provider] Taking Active Spouse/Significant Other  albuterol (PROVENTIL) (2.5 MG/3ML) 0.083% nebulizer solution [id number]  USE 1 VIAL IN NEBULIZER 3 TIMES DAILY [name], MD  Active   Artificial Tear Ointment (DRY EYES OP) [id number] No Place 2 drops into both eyes daily as needed (for dry eyes). [provider] Taking Active Spouse/Significant Other  aspirin EC 81  MG tablet [id number] No Take 81 mg by mouth at bedtime. [provider] Taking Active Spouse/Significant Other           Med Note (MATHIS, Caryn Section   Tue Nov 20, 2019  8:52 AM)    famotidine (PEPCID) 20 MG tablet [id number] No Take 20 mg by mouth 2 (two) times daily. [provider] Taking Active            Med Note Orma Flaming Jun 03, 2021 11:53 AM) Patient taking QD now  fenofibrate (TRICOR) 145 MG tablet [id number]  TAKE ONE TABLET DAILY [name], MD  Active   finasteride (PROSCAR) 5 MG tablet [id number]  TAKE ONE TABLET DAILY [name], MD  Active   fish oil-omega-3 fatty acids 1000 MG capsule [id number] No Take 4 g by mouth 2 (two) times daily. [provider] Taking Active Spouse/Significant Other           Med Note (MATHIS, Caryn Section   Tue Nov 20, 2019  8:53 AM)    ipratropium (ATROVENT) 0.02 % nebulizer solution [id number]  USE 1 VIAL IN NEBULIZER 3 TIMES DAILY [name], MD  Active   ramipril (ALTACE) 10 MG capsule [id number]  Take 1 capsule (10 mg total) by mouth daily. CoxElnita Maxwell, MD  Active             Patient Active Problem List   Diagnosis Date Noted   Adult BMI <19 kg/sq m 10/02/2020   Arthritis of right hand 03/19/2020   Malnutrition of moderate degree ([place]) 09/12/2019   Hyperlipidemia    COPD (chronic obstructive pulmonary disease) (HCC)    Chronic kidney disease    Dementia associated with other underlying disease with behavioral disturbance    S/P CABG (coronary artery bypass graft) 1988 06/05/2013   Unstable angina ([place]) 06/05/2013   Essential hypertension 11/29/2008   CAD (coronary artery disease) 11/29/2008    Immunization History  Administered Date(s) Administered  Fluad Quad(high Dose 65+) 02/22/2020   Influenza-Unspecified 02/19/2013   Pneumococcal Conjugate-13 07/11/2014   Pneumococcal Polysaccharide-23 07/10/2013    Conditions to be addressed/monitored:  Hypertension and  Hyperlipidemia  Care Plan : [place]  Updates made by Lane Hacker, [place] since 12/02/2021 12:00 AM     Problem: HTN, Cholesterol, Dementia   Priority: High  Onset Date: 12/04/2020     Long-Range Goal: Disease State Management   Start Date: 12/04/2020  Expected End Date: 12/04/2021  Recent Progress: On track  Priority: High  Note:   Current Barriers:  Does not maintain contact with provider office  Pharmacist Clinical Goal(s):  Patient will contact provider office for questions/concerns as evidenced notation of same in electronic health record through collaboration with PharmD and provider.   Interventions: 1:1 collaboration with [name], MD regarding development and update of comprehensive plan of care as evidenced by provider attestation and co-signature Inter-disciplinary care team collaboration (see longitudinal plan of care) Comprehensive medication review performed; medication list updated in electronic medical record  Hypertension (BP goal <140/90) BP Readings from Last 3 Encounters:  04/08/21 (!) 84/50  10/02/20 110/62  07/01/20 110/64  -Controlled -Current treatment: Ramapril 10mg  QD Appropriate, Effective, Safe, Accessible -Medications previously tried: Metoprolol  -Current home readings:  September 2022: 110-120/60-70 (Wife is former nurse who takes BP) March 2023: 110/60-116/67 Sept 203: 108/60 110/106 118/68 120/68 -Current dietary habits: Wife states, "He's 64 years old and has dementia so doesn't understand if I tell him he can't eat a cookie so I just let him have it -Current exercise habits: None due to age and status -Denies hypotensive/hypertensive symptoms -Educated on BP goals and benefits of medications for prevention of heart attack, stroke and kidney damage; -Counseled to monitor BP at home Weekly, document, and provide log at future appointments Sept 2023:   CAD: (LDL goal < 100) Lab Results  Component Value  Date   CHOL 217 (H) 04/08/2021   CHOL 227 (H) 10/02/2020   CHOL 227 (H) 07/01/2020   Lab Results  Component Value Date   HDL 38 (L) 04/08/2021   HDL 36 (L) 10/02/2020   HDL 32 (L) 07/01/2020   Lab Results  Component Value Date   LDLCALC 144 (H) 04/08/2021   LDLCALC 160 (H) 10/02/2020   LDLCALC 161 (H) 07/01/2020   Lab Results  Component Value Date   TRIG 193 (H) 04/08/2021   TRIG 169 (H) 10/02/2020   TRIG 181 (H) 07/01/2020   Lab Results  Component Value Date   CHOLHDL 5.7 (H) 04/08/2021   CHOLHDL 6.3 (H) 10/02/2020   CHOLHDL 7.1 (H) 07/01/2020   Lab Results  Component Value Date   LDLDIRECT 99.5 05/09/2012   LDLDIRECT 61.0 04/12/2011   LDLDIRECT 130.8 01/11/2011  -Not ideally controlled  *Per wife, patient has had high Trig for whole life but other cholesterol was normal until he had HA in the 80's, then all his levels became bad -Current treatment: Fenofibrate 145mg  Appropriate, Effective, Safe, Accessible ASA 81mg  Appropriate, Effective, Safe, Accessible -Medications previously tried: All statins (Does have ICD code in profile) -Current dietary habits: Wife states, "He's 29 years old and has dementia so doesn't understand if I tell him he can't eat a cookie so I just let him have it -Current exercise habits: None due to age and status -Educated on Cholesterol goals;  -Recommended to continue current medication  GERD (Goal: minimize symptoms of reflux ) -Controlled -Current treatment  Famotidine 20mg  BID (Patient  taking QD) Appropriate, Effective, Safe, Accessible -Medications previously tried: none reported  -Triggering factors: Spicy food -Hx of Bleeds/ulcers: No -Counseled on small meals, elevating head, and sleeping on left side -Recommended to continue current medication    Patient Goals/Self-Care Activities Patient will:  - take medications as prescribed  Follow Up Plan: The patient has been provided with contact information for the care management  team and has been advised to call with any health related questions or concerns.   CPP F/U prn  [place] Constable, Pharm.D. - [phone]        Medication Assistance: None required.  Patient affirms current coverage meets needs.  Compliance/Adherence/Medication fill history: Star Rating Drugs: Ramipril 10 mg- Last filled 02-25-2021 90 DS  Patient's preferred pharmacy is:  [place], [address] [place] [address] Phone: [phone] Fax: [place], [address] [place] [address] Phone: [phone] Fax: [phone]  Uses pill box? Yes Pt endorses 100% compliance  Care Plan and Follow Up Patient Decision:  Patient agrees to Care Plan and Follow-up.  Plan: The care management team will reach out to the patient again over the next 90 days.   CPP F/U prn  [place] Constable, Pharm.D. - [phone]

## 2021-12-02 NOTE — Patient Instructions (Signed)
Visit Information   Goals Addressed   None    Patient Care Plan: CCM Pharmacy Care Plan     Problem Identified: HTN, Cholesterol, Dementia   Priority: High  Onset Date: 12/04/2020     Long-Range Goal: Disease State Management   Start Date: 12/04/2020  Expected End Date: 12/04/2021  Recent Progress: On track  Priority: High  Note:   Current Barriers:  Does not maintain contact with provider office  Pharmacist Clinical Goal(s):  Patient will contact provider office for questions/concerns as evidenced notation of same in electronic health record through collaboration with PharmD and provider.   Interventions: 1:1 collaboration with Brian Anes, MD regarding development and update of comprehensive plan of care as evidenced by provider attestation and co-signature Inter-disciplinary care team collaboration (see longitudinal plan of care) Comprehensive medication review performed; medication list updated in electronic medical record  Hypertension (BP goal <140/90) BP Readings from Last 3 Encounters:  04/08/21 (!) 84/50  10/02/20 110/62  07/01/20 110/64  -Controlled -Current treatment: Ramapril '10mg'$  QD Appropriate, Effective, Safe, Accessible -Medications previously tried: Metoprolol  -Current home readings:  September 2022: 110-120/60-70 (Wife is former nurse who takes BP) March 2023: 110/60-116/67 Sept 203: 108/60 110/106 118/68 120/68 -Current dietary habits: Wife states, "He's 52 years old and has dementia so doesn't understand if I tell him he can't eat a cookie so I just let him have it -Current exercise habits: None due to age and status -Denies hypotensive/hypertensive symptoms -Educated on BP goals and benefits of medications for prevention of heart attack, stroke and kidney damage; -Counseled to monitor BP at home Weekly, document, and provide log at future appointments Sept 2023:   CAD: (LDL goal < 100) Lab Results  Component Value Date   CHOL 217  (H) 04/08/2021   CHOL 227 (H) 10/02/2020   CHOL 227 (H) 07/01/2020   Lab Results  Component Value Date   HDL 38 (L) 04/08/2021   HDL 36 (L) 10/02/2020   HDL 32 (L) 07/01/2020   Lab Results  Component Value Date   LDLCALC 144 (H) 04/08/2021   LDLCALC 160 (H) 10/02/2020   LDLCALC 161 (H) 07/01/2020   Lab Results  Component Value Date   TRIG 193 (H) 04/08/2021   TRIG 169 (H) 10/02/2020   TRIG 181 (H) 07/01/2020   Lab Results  Component Value Date   CHOLHDL 5.7 (H) 04/08/2021   CHOLHDL 6.3 (H) 10/02/2020   CHOLHDL 7.1 (H) 07/01/2020   Lab Results  Component Value Date   LDLDIRECT 99.5 05/09/2012   LDLDIRECT 61.0 04/12/2011   LDLDIRECT 130.8 01/11/2011  -Not ideally controlled  *Per wife, patient has had high Trig for whole life but other cholesterol was normal until he had HA in the 80's, then all his levels became bad -Current treatment: Fenofibrate '145mg'$  Appropriate, Effective, Safe, Accessible ASA '81mg'$  Appropriate, Effective, Safe, Accessible -Medications previously tried: All statins (Does have ICD code in profile) -Current dietary habits: Wife states, "He's 27 years old and has dementia so doesn't understand if I tell him he can't eat a cookie so I just let him have it -Current exercise habits: None due to age and status -Educated on Cholesterol goals;  -Recommended to continue current medication  GERD (Goal: minimize symptoms of reflux ) -Controlled -Current treatment  Famotidine '20mg'$  BID (Patient taking QD) Appropriate, Effective, Safe, Accessible -Medications previously tried: none reported  -Triggering factors: Spicy food -Hx of Bleeds/ulcers: No -Counseled on small meals, elevating head, and sleeping on left side -  Recommended to continue current medication    Patient Goals/Self-Care Activities Patient will:  - take medications as prescribed  Follow Up Plan: The patient has been provided with contact information for the care management team and has  been advised to call with any health related questions or concerns.   CPP F/U prn  [place] Constable, Pharm.D. - [phone]       [name] was given information about Chronic Care Management services today including:  CCM service includes personalized support from designated clinical staff supervised by his physician, including individualized plan of care and coordination with other care providers 24/7 contact phone numbers for assistance for urgent and routine care needs. Standard insurance, coinsurance, copays and deductibles apply for chronic care management only during months in which we provide at least 20 minutes of these services. Most insurances cover these services at 100%, however patients may be responsible for any copay, coinsurance and/or deductible if applicable. This service may help you avoid the need for more expensive face-to-face services. Only one practitioner may furnish and bill the service in a calendar month. The patient may stop CCM services at any time (effective at the end of the month) by phone call to the office staff.  Patient agreed to services and verbal consent obtained.   The patient verbalized understanding of instructions, educational materials, and care plan provided today and DECLINED offer to receive copy of patient instructions, educational materials, and care plan.  CCM F/u prn  Lane Hacker, [place]

## 2021-12-04 ENCOUNTER — Other Ambulatory Visit: Payer: Self-pay

## 2021-12-04 DIAGNOSIS — E782 Mixed hyperlipidemia: Secondary | ICD-10-CM

## 2021-12-04 DIAGNOSIS — N401 Enlarged prostate with lower urinary tract symptoms: Secondary | ICD-10-CM

## 2021-12-04 MED ORDER — RAMIPRIL 10 MG PO CAPS: 10.0000 mg | ORAL_CAPSULE | Freq: Every day | ORAL | 0 refills | Status: AC

## 2021-12-04 MED ORDER — FINASTERIDE 5 MG PO TABS: 5.0000 mg | ORAL_TABLET | Freq: Every day | ORAL | 0 refills | Status: AC

## 2021-12-04 MED ORDER — FENOFIBRATE 145 MG PO TABS: 145.0000 mg | ORAL_TABLET | Freq: Every day | ORAL | 0 refills | Status: AC

## 2021-12-04 NOTE — Telephone Encounter (Signed)
Per Dr Tobie Poet - acceptable to schedule patient for MyChart visit and send in refills for one month.  Reached out to [facility] for additional resources including going out to the home to draw labs.  Appointment with Dr Tobie Poet scheduled for 12/16/21 at 11:00.  Refills sent to [facility] #30/0 refills.  Patient's wife, [name] made aware.

## 2021-12-14 DIAGNOSIS — I129 Hypertensive chronic kidney disease with stage 1 through stage 4 chronic kidney disease, or unspecified chronic kidney disease: Secondary | ICD-10-CM | POA: Diagnosis not present

## 2021-12-14 DIAGNOSIS — R410 Disorientation, unspecified: Secondary | ICD-10-CM | POA: Diagnosis not present

## 2021-12-14 DIAGNOSIS — F0390 Unspecified dementia without behavioral disturbance: Secondary | ICD-10-CM | POA: Diagnosis not present

## 2021-12-14 DIAGNOSIS — I252 Old myocardial infarction: Secondary | ICD-10-CM | POA: Diagnosis not present

## 2021-12-14 DIAGNOSIS — I2510 Atherosclerotic heart disease of native coronary artery without angina pectoris: Secondary | ICD-10-CM | POA: Diagnosis not present

## 2021-12-14 DIAGNOSIS — F32A Depression, unspecified: Secondary | ICD-10-CM | POA: Diagnosis not present

## 2021-12-14 DIAGNOSIS — Z20822 Contact with and (suspected) exposure to covid-19: Secondary | ICD-10-CM | POA: Diagnosis not present

## 2021-12-14 DIAGNOSIS — Z880 Allergy status to penicillin: Secondary | ICD-10-CM | POA: Diagnosis not present

## 2021-12-14 DIAGNOSIS — N390 Urinary tract infection, site not specified: Secondary | ICD-10-CM | POA: Diagnosis not present

## 2021-12-14 DIAGNOSIS — G9341 Metabolic encephalopathy: Secondary | ICD-10-CM | POA: Diagnosis not present

## 2021-12-14 DIAGNOSIS — Z7401 Bed confinement status: Secondary | ICD-10-CM | POA: Diagnosis not present

## 2021-12-14 DIAGNOSIS — Z66 Do not resuscitate: Secondary | ICD-10-CM | POA: Diagnosis not present

## 2021-12-14 DIAGNOSIS — Z87891 Personal history of nicotine dependence: Secondary | ICD-10-CM | POA: Diagnosis not present

## 2021-12-14 DIAGNOSIS — Z515 Encounter for palliative care: Secondary | ICD-10-CM | POA: Diagnosis not present

## 2021-12-14 DIAGNOSIS — I10 Essential (primary) hypertension: Secondary | ICD-10-CM | POA: Diagnosis not present

## 2021-12-14 DIAGNOSIS — R456 Violent behavior: Secondary | ICD-10-CM | POA: Diagnosis not present

## 2021-12-14 DIAGNOSIS — G311 Senile degeneration of brain, not elsewhere classified: Secondary | ICD-10-CM | POA: Diagnosis not present

## 2021-12-14 DIAGNOSIS — Z87440 Personal history of urinary (tract) infections: Secondary | ICD-10-CM | POA: Diagnosis not present

## 2021-12-14 DIAGNOSIS — Z79899 Other long term (current) drug therapy: Secondary | ICD-10-CM | POA: Diagnosis not present

## 2021-12-14 DIAGNOSIS — F03B11 Unspecified dementia, moderate, with agitation: Secondary | ICD-10-CM | POA: Diagnosis not present

## 2021-12-14 DIAGNOSIS — R0689 Other abnormalities of breathing: Secondary | ICD-10-CM | POA: Diagnosis not present

## 2021-12-14 DIAGNOSIS — K449 Diaphragmatic hernia without obstruction or gangrene: Secondary | ICD-10-CM | POA: Diagnosis not present

## 2021-12-14 DIAGNOSIS — F02811 Dementia in other diseases classified elsewhere, unspecified severity, with agitation: Secondary | ICD-10-CM | POA: Diagnosis not present

## 2021-12-14 DIAGNOSIS — R4182 Altered mental status, unspecified: Secondary | ICD-10-CM | POA: Diagnosis not present

## 2021-12-14 DIAGNOSIS — E7800 Pure hypercholesterolemia, unspecified: Secondary | ICD-10-CM | POA: Diagnosis not present

## 2021-12-14 DIAGNOSIS — Z951 Presence of aortocoronary bypass graft: Secondary | ICD-10-CM | POA: Diagnosis not present

## 2021-12-14 DIAGNOSIS — R404 Transient alteration of awareness: Secondary | ICD-10-CM | POA: Diagnosis not present

## 2021-12-14 DIAGNOSIS — Z7982 Long term (current) use of aspirin: Secondary | ICD-10-CM | POA: Diagnosis not present

## 2021-12-14 DIAGNOSIS — N183 Chronic kidney disease, stage 3 unspecified: Secondary | ICD-10-CM | POA: Diagnosis not present

## 2021-12-14 DIAGNOSIS — J449 Chronic obstructive pulmonary disease, unspecified: Secondary | ICD-10-CM | POA: Diagnosis not present

## 2021-12-16 ENCOUNTER — Telehealth: Payer: Medicare Other | Admitting: Family Medicine

## 2021-12-18 DIAGNOSIS — I2510 Atherosclerotic heart disease of native coronary artery without angina pectoris: Secondary | ICD-10-CM | POA: Diagnosis not present

## 2021-12-18 DIAGNOSIS — I10 Essential (primary) hypertension: Secondary | ICD-10-CM

## 2021-12-20 DEATH — deceased

## 2022-02-15 ENCOUNTER — Other Ambulatory Visit: Payer: Self-pay | Admitting: Legal Medicine

## 2022-02-15 DIAGNOSIS — J449 Chronic obstructive pulmonary disease, unspecified: Secondary | ICD-10-CM
